# Patient Record
Sex: Female | Born: 1950
Health system: Southern US, Community
[De-identification: ages and names within clinical notes are randomized; demographics above are authoritative.]

## PROBLEM LIST (undated history)

## (undated) DIAGNOSIS — F419 Anxiety disorder, unspecified: Secondary | ICD-10-CM

## (undated) DIAGNOSIS — E119 Type 2 diabetes mellitus without complications: Secondary | ICD-10-CM

## (undated) DIAGNOSIS — E785 Hyperlipidemia, unspecified: Secondary | ICD-10-CM

## (undated) DIAGNOSIS — N951 Menopausal and female climacteric states: Secondary | ICD-10-CM

## (undated) DIAGNOSIS — F429 Obsessive-compulsive disorder, unspecified: Secondary | ICD-10-CM

## (undated) DIAGNOSIS — I509 Heart failure, unspecified: Secondary | ICD-10-CM

## (undated) DIAGNOSIS — H269 Unspecified cataract: Secondary | ICD-10-CM

## (undated) DIAGNOSIS — I10 Essential (primary) hypertension: Secondary | ICD-10-CM

## (undated) DIAGNOSIS — T7840XA Allergy, unspecified, initial encounter: Secondary | ICD-10-CM

## (undated) DIAGNOSIS — R002 Palpitations: Secondary | ICD-10-CM

## (undated) DIAGNOSIS — J45909 Unspecified asthma, uncomplicated: Secondary | ICD-10-CM

## (undated) DIAGNOSIS — F329 Major depressive disorder, single episode, unspecified: Secondary | ICD-10-CM

## (undated) DIAGNOSIS — F32A Depression, unspecified: Secondary | ICD-10-CM

## (undated) HISTORY — DX: Anxiety disorder, unspecified: F41.9

## (undated) HISTORY — DX: Hyperlipidemia, unspecified: E78.5

## (undated) HISTORY — DX: Obsessive-compulsive disorder, unspecified: F42.9

## (undated) HISTORY — DX: Menopausal and female climacteric states: N95.1

## (undated) HISTORY — DX: Unspecified asthma, uncomplicated: J45.909

## (undated) HISTORY — DX: Depression, unspecified: F32.A

## (undated) HISTORY — PX: OTHER SURGICAL HISTORY: SHX169

## (undated) HISTORY — DX: Allergy, unspecified, initial encounter: T78.40XA

## (undated) HISTORY — DX: Unspecified cataract: H26.9

## (undated) HISTORY — DX: Type 2 diabetes mellitus without complications: E11.9

## (undated) HISTORY — DX: Palpitations: R00.2

## (undated) HISTORY — PX: CHOLECYSTECTOMY: SHX55

## (undated) HISTORY — PX: ROOT CANAL: SHX2363

## (undated) HISTORY — DX: Essential (primary) hypertension: I10

## (undated) HISTORY — PX: EYE SURGERY: SHX253

## (undated) HISTORY — DX: Major depressive disorder, single episode, unspecified: F32.9

---

## 2005-08-20 LAB — HM MAMMOGRAPHY

## 2006-11-15 ENCOUNTER — Other Ambulatory Visit: Payer: Self-pay

## 2006-11-15 ENCOUNTER — Inpatient Hospital Stay: Payer: Self-pay | Admitting: Internal Medicine

## 2007-12-02 ENCOUNTER — Other Ambulatory Visit: Payer: Self-pay

## 2007-12-03 ENCOUNTER — Inpatient Hospital Stay: Payer: Self-pay | Admitting: Internal Medicine

## 2009-04-05 ENCOUNTER — Emergency Department: Payer: Self-pay | Admitting: Emergency Medicine

## 2014-04-01 ENCOUNTER — Emergency Department: Payer: Self-pay | Admitting: Emergency Medicine

## 2014-04-01 LAB — CBC WITH DIFFERENTIAL/PLATELET
BASOS ABS: 0.1 10*3/uL (ref 0.0–0.1)
Basophil %: 1.4 %
Eosinophil #: 0 10*3/uL (ref 0.0–0.7)
Eosinophil %: 0.4 %
HCT: 41.7 % (ref 35.0–47.0)
HGB: 13.1 g/dL (ref 12.0–16.0)
LYMPHS ABS: 1.9 10*3/uL (ref 1.0–3.6)
LYMPHS PCT: 45.6 %
MCH: 26.3 pg (ref 26.0–34.0)
MCHC: 31.3 g/dL — ABNORMAL LOW (ref 32.0–36.0)
MCV: 84 fL (ref 80–100)
Monocyte #: 0.6 x10 3/mm (ref 0.2–0.9)
Monocyte %: 14.1 %
NEUTROS ABS: 1.6 10*3/uL (ref 1.4–6.5)
Neutrophil %: 38.5 %
PLATELETS: 165 10*3/uL (ref 150–440)
RBC: 4.96 10*6/uL (ref 3.80–5.20)
RDW: 13.7 % (ref 11.5–14.5)
WBC: 4.2 10*3/uL (ref 3.6–11.0)

## 2014-04-01 LAB — URINALYSIS, COMPLETE
Bilirubin,UR: NEGATIVE
Blood: NEGATIVE
Glucose,UR: NEGATIVE mg/dL (ref 0–75)
Hyaline Cast: 74
Ketone: NEGATIVE
Leukocyte Esterase: NEGATIVE
NITRITE: NEGATIVE
PH: 6 (ref 4.5–8.0)
Protein: 25
Specific Gravity: 1.025 (ref 1.003–1.030)

## 2014-04-01 LAB — COMPREHENSIVE METABOLIC PANEL
ALBUMIN: 3.6 g/dL (ref 3.4–5.0)
ALT: 37 U/L (ref 12–78)
ANION GAP: 6 — AB (ref 7–16)
Alkaline Phosphatase: 92 U/L
BUN: 14 mg/dL (ref 7–18)
Bilirubin,Total: 0.4 mg/dL (ref 0.2–1.0)
Calcium, Total: 8.6 mg/dL (ref 8.5–10.1)
Chloride: 105 mmol/L (ref 98–107)
Co2: 27 mmol/L (ref 21–32)
Creatinine: 1.2 mg/dL (ref 0.60–1.30)
EGFR (African American): 56 — ABNORMAL LOW
EGFR (Non-African Amer.): 48 — ABNORMAL LOW
GLUCOSE: 91 mg/dL (ref 65–99)
OSMOLALITY: 276 (ref 275–301)
POTASSIUM: 3.9 mmol/L (ref 3.5–5.1)
SGOT(AST): 43 U/L — ABNORMAL HIGH (ref 15–37)
Sodium: 138 mmol/L (ref 136–145)
Total Protein: 7.9 g/dL (ref 6.4–8.2)

## 2015-06-25 ENCOUNTER — Other Ambulatory Visit: Payer: Self-pay

## 2015-06-25 NOTE — Telephone Encounter (Signed)
Has not been seen since October. Needs appointment then will get refill.

## 2015-06-26 NOTE — Telephone Encounter (Signed)
Called and scheduled patient an appointment in August.

## 2015-07-05 ENCOUNTER — Other Ambulatory Visit: Payer: Self-pay | Admitting: Family Medicine

## 2015-07-25 DIAGNOSIS — H9190 Unspecified hearing loss, unspecified ear: Secondary | ICD-10-CM | POA: Diagnosis not present

## 2015-07-25 DIAGNOSIS — H6121 Impacted cerumen, right ear: Secondary | ICD-10-CM | POA: Diagnosis not present

## 2015-08-08 DIAGNOSIS — R002 Palpitations: Secondary | ICD-10-CM | POA: Insufficient documentation

## 2015-08-08 DIAGNOSIS — F32A Depression, unspecified: Secondary | ICD-10-CM

## 2015-08-08 DIAGNOSIS — E785 Hyperlipidemia, unspecified: Secondary | ICD-10-CM | POA: Insufficient documentation

## 2015-08-08 DIAGNOSIS — F429 Obsessive-compulsive disorder, unspecified: Secondary | ICD-10-CM | POA: Insufficient documentation

## 2015-08-08 DIAGNOSIS — N951 Menopausal and female climacteric states: Secondary | ICD-10-CM | POA: Insufficient documentation

## 2015-08-08 DIAGNOSIS — J45909 Unspecified asthma, uncomplicated: Secondary | ICD-10-CM | POA: Insufficient documentation

## 2015-08-08 DIAGNOSIS — F329 Major depressive disorder, single episode, unspecified: Secondary | ICD-10-CM

## 2015-08-08 DIAGNOSIS — I10 Essential (primary) hypertension: Secondary | ICD-10-CM | POA: Insufficient documentation

## 2015-08-09 ENCOUNTER — Encounter: Payer: Self-pay | Admitting: Unknown Physician Specialty

## 2015-08-09 ENCOUNTER — Ambulatory Visit (INDEPENDENT_AMBULATORY_CARE_PROVIDER_SITE_OTHER): Payer: Medicare Other | Admitting: Unknown Physician Specialty

## 2015-08-09 VITALS — BP 141/79 | HR 76 | Temp 98.1°F | Ht 60.7 in | Wt 186.0 lb

## 2015-08-09 DIAGNOSIS — F322 Major depressive disorder, single episode, severe without psychotic features: Secondary | ICD-10-CM | POA: Diagnosis not present

## 2015-08-09 DIAGNOSIS — R221 Localized swelling, mass and lump, neck: Secondary | ICD-10-CM | POA: Diagnosis not present

## 2015-08-09 DIAGNOSIS — F329 Major depressive disorder, single episode, unspecified: Secondary | ICD-10-CM | POA: Insufficient documentation

## 2015-08-09 DIAGNOSIS — I10 Essential (primary) hypertension: Secondary | ICD-10-CM

## 2015-08-09 MED ORDER — HYDROCHLOROTHIAZIDE 25 MG PO TABS: 25.0000 mg | ORAL_TABLET | Freq: Every day | ORAL | Status: DC

## 2015-08-09 MED ORDER — PAROXETINE HCL 40 MG PO TABS: 40.0000 mg | ORAL_TABLET | ORAL | Status: DC

## 2015-08-09 NOTE — Assessment & Plan Note (Signed)
Borderline today.  Recheck at physical she will schedule.  Will do labs at physical

## 2015-08-09 NOTE — Assessment & Plan Note (Signed)
Stable on medications

## 2015-08-09 NOTE — Progress Notes (Signed)
BP 141/79 mmHg  Pulse 76  Temp(Src) 98.1 F (36.7 C)  Ht 5' 0.7" (1.542 m)  Wt 186 lb (84.369 kg)  BMI 35.48 kg/m2  SpO2 97%  LMP  (LMP Unknown)   Subjective:    Patient ID: Lisa Bass, female    DOB: 1951/07/04, 64 y.o.   MRN: [id number]  HPI: Lisa Bass is a 64 y.o. female  Chief Complaint  Patient presents with  . Medication Refill   Hypertension This is a chronic problem. The problem is controlled. Pertinent negatives include no anxiety, blurred vision, chest pain, headaches, malaise/fatigue, neck pain, orthopnea, palpitations, peripheral edema, PND, shortness of breath or sweats. Past treatments include diuretics. There are no compliance problems.    2.  DEPRESSION Patient is requesting refill(s). Mood status:  controlled  H6  Satisfied with current treatment?:  yes  H2 Symptom severity:  mild  H3  Medication compliance:  excellent compliance  P1 Psychotherapy/counseling:  no  P1  Previous psychiatric medications:  taking 1/2 Paroxetine  P1 .H: Depression Screen X  Anxious mood:  no  H2 Anhedonia:  yes   H3 Significant weight loss or gain:  no  H8 Insomnia:  no  H8 Fatigue:  no  H8 Feelings of worthlessness or guilt:  yes   H8 Impaired concentration/indecisiveness:  no  Suicidal ideations:  no  H8 Hopelessness:  no  H8 Crying spells:  no  H8  .V6: PHQ 9 score: 3  3.  Nodule Nodule on back of neck.  Has been there for a while about 2 weeks.  Not getting bigger   Relevant past medical, surgical, family and social history reviewed and updated as indicated. Interim medical history since our last visit reviewed. Allergies and medications reviewed and updated.  Review of Systems  Constitutional: Negative for malaise/fatigue.  Eyes: Negative for blurred vision.  Respiratory: Negative for shortness of breath.   Cardiovascular: Negative for chest pain, palpitations, orthopnea and PND.  Musculoskeletal: Negative for neck pain.  Neurological: Negative for  headaches.    Per HPI unless specifically indicated above     Objective:    BP 141/79 mmHg  Pulse 76  Temp(Src) 98.1 F (36.7 C)  Ht 5' 0.7" (1.542 m)  Wt 186 lb (84.369 kg)  BMI 35.48 kg/m2  SpO2 97%  LMP  (LMP Unknown)  Wt Readings from Last 3 Encounters:  08/09/15 186 lb (84.369 kg)  10/02/14 192 lb (87.091 kg)    Physical Exam  Constitutional: She is oriented to person, place, and time. She appears well-developed and well-nourished. No distress.  HENT:  Head: Normocephalic and atraumatic.  Eyes: Conjunctivae and lids are normal. Right eye exhibits no discharge. Left eye exhibits no discharge. No scleral icterus.  Neck:  Has a 4-5 cm nodule back of neck  Cardiovascular: Normal rate, regular rhythm and normal heart sounds.   Pulmonary/Chest: Effort normal and breath sounds normal. No respiratory distress.  Abdominal: Normal appearance. She exhibits no distension. There is no splenomegaly or hepatomegaly. There is no tenderness.  Musculoskeletal: Normal range of motion.  Neurological: She is alert and oriented to person, place, and time.  Skin: Skin is intact. No rash noted. No pallor.  Psychiatric: She has a normal mood and affect. Her behavior is normal. Judgment and thought content normal.  Nursing note and vitals reviewed.   Results for orders placed or performed in visit on 08/08/15  HM MAMMOGRAPHY  Result Value Ref Range   [facility] M[facility] from [facility]  Assessment & Plan:   Problem List Items Addressed This Visit      Unprioritized   Hypertension    Borderline today.  Recheck at physical she will schedule.  Will do labs at physical      Major depression, chronic - Primary    Stable on medications       Other Visit Diagnoses    Nodule of neck        Probably a lipoma.  Refer to ENT for further evaluation.           Follow up plan: Return for physical.

## 2015-08-28 DIAGNOSIS — D170 Benign lipomatous neoplasm of skin and subcutaneous tissue of head, face and neck: Secondary | ICD-10-CM | POA: Diagnosis not present

## 2015-08-28 DIAGNOSIS — H6120 Impacted cerumen, unspecified ear: Secondary | ICD-10-CM | POA: Diagnosis not present

## 2015-09-23 ENCOUNTER — Encounter: Payer: Self-pay | Admitting: Unknown Physician Specialty

## 2015-09-23 ENCOUNTER — Ambulatory Visit (INDEPENDENT_AMBULATORY_CARE_PROVIDER_SITE_OTHER): Payer: Medicare Other | Admitting: Unknown Physician Specialty

## 2015-09-23 VITALS — BP 127/78 | HR 78 | Temp 98.5°F | Ht 60.7 in | Wt 184.8 lb

## 2015-09-23 DIAGNOSIS — D151 Benign neoplasm of heart: Secondary | ICD-10-CM | POA: Diagnosis not present

## 2015-09-23 DIAGNOSIS — Z0000 Encounter for general adult medical examination without abnormal findings: Secondary | ICD-10-CM

## 2015-09-23 DIAGNOSIS — Z23 Encounter for immunization: Secondary | ICD-10-CM

## 2015-09-23 DIAGNOSIS — I10 Essential (primary) hypertension: Secondary | ICD-10-CM | POA: Diagnosis not present

## 2015-09-23 DIAGNOSIS — I2510 Atherosclerotic heart disease of native coronary artery without angina pectoris: Secondary | ICD-10-CM | POA: Insufficient documentation

## 2015-09-23 NOTE — Assessment & Plan Note (Addendum)
Refer to Dr. Clayborn Bigness due to low voltage on EKG

## 2015-09-23 NOTE — Progress Notes (Signed)
BP 127/78 mmHg  Pulse 78  Temp(Src) 98.5 F (36.9 C)  Ht 5' 0.7" (1.542 m)  Wt 184 lb 12.8 oz (83.825 kg)  BMI 35.25 kg/m2  SpO2 96%  LMP  (LMP Unknown)   Subjective:    Patient ID: Lisa Bass, female    DOB: [birth year], 64 y.o.   MRN: [id number]  HPI: Lisa Bass is a 64 y.o. female  Chief Complaint  Patient presents with  . Medicare Wellness   Depression screen [facility] 2/9 09/23/2015 08/09/2015  Decreased Interest 1 0  Down, Depressed, Hopeless 0 1  PHQ - 2 Score 1 1   Functional Status Survey: Is the patient deaf or have difficulty hearing?: No Does the patient have difficulty seeing, even when wearing glasses/contacts?: No Does the patient have difficulty concentrating, remembering, or making decisions?: No Does the patient have difficulty walking or climbing stairs?: No Does the patient have difficulty dressing or bathing?: No Does the patient have difficulty doing errands alone such as visiting a doctor's office or shopping?: No  Relevant past medical, surgical, family and social history reviewed and updated as indicated. Interim medical history since our last visit reviewed. Allergies and medications reviewed and updated.  See functional status, depression screen, and fall's risk assessment  under the appropriate section.    Pt is able to perform complex mental tasks, recognize clock face, recognize time and do a 3 item recall.    Hypertension This is a new problem. The problem is unchanged. The problem is controlled. Pertinent negatives include no anxiety, blurred vision, chest pain, headaches, malaise/fatigue, neck pain, orthopnea, palpitations, peripheral edema, PND, shortness of breath or sweats. There are no associated agents to hypertension. There are no known risk factors for coronary artery disease. Past treatments include nothing. The current treatment provides no improvement. There are no compliance problems.  There is no history of chronic renal disease.       Relevant past medical, surgical, family and social history reviewed and updated as indicated. Interim medical history since our last visit reviewed. Allergies and medications reviewed and updated.  Review of Systems  Constitutional: Negative for malaise/fatigue.  Eyes: Negative for blurred vision.  Respiratory: Negative for shortness of breath.   Cardiovascular: Negative for chest pain, palpitations, orthopnea and PND.  Musculoskeletal: Negative for neck pain.  Neurological: Negative for headaches.    Per HPI unless specifically indicated above     Objective:    BP 127/78 mmHg  Pulse 78  Temp(Src) 98.5 F (36.9 C)  Ht 5' 0.7" (1.542 m)  Wt 184 lb 12.8 oz (83.825 kg)  BMI 35.25 kg/m2  SpO2 96%  LMP  (LMP Unknown)  Wt Readings from Last 3 Encounters:  09/23/15 184 lb 12.8 oz (83.825 kg)  08/09/15 186 lb (84.369 kg)  10/02/14 192 lb (87.091 kg)    Physical Exam  Constitutional: She is oriented to person, place, and time. She appears well-developed and well-nourished.  HENT:  Head: Normocephalic and atraumatic.  Eyes: Pupils are equal, round, and reactive to light. Right eye exhibits no discharge. Left eye exhibits no discharge. No scleral icterus.  Neck: Normal range of motion. Neck supple. Carotid bruit is not present. No thyromegaly present.  Cardiovascular: Normal rate, regular rhythm and normal heart sounds.  Exam reveals no gallop and no friction rub.   No murmur heard. Pulmonary/Chest: Effort normal and breath sounds normal. No respiratory distress. She has no wheezes. She has no rales.  Abdominal: Soft. Bowel sounds are  normal. There is no tenderness. There is no rebound.  Genitourinary: Uterus normal. No breast swelling, tenderness or discharge. Cervix exhibits no motion tenderness, no discharge and no friability. Right adnexum displays no mass, no tenderness and no fullness. Left adnexum displays no mass, no tenderness and no fullness.  Musculoskeletal:  Normal range of motion.  Lymphadenopathy:    She has no cervical adenopathy.  Neurological: She is alert and oriented to person, place, and time.  Skin: Skin is warm, dry and intact. No rash noted.  Psychiatric: She has a normal mood and affect. Her speech is normal and behavior is normal. Judgment and thought content normal. Cognition and memory are normal.   EKG shows low voltage.  She has not seen Dr. Clayborn Bigness for "a while"  Results for orders placed or performed in visit on 08/08/15  HM MAMMOGRAPHY  Result Value Ref Range   HM Mammogram from [facility]       Assessment & Plan:   Problem List Items Addressed This Visit      Unprioritized   Hypertension    Stable, continue present medications.       Relevant Orders   Comprehensive metabolic panel   Myxoma of heart    Refer to Dr. Clayborn Bigness due to low voltage on EKG      Relevant Orders   Ambulatory referral to Cardiology    Other Visit Diagnoses    Routine general medical examination at a health care facility    -  Primary    Relevant Orders    Pneumococcal conjugate vaccine 13-valent IM (Completed)    MM DIGITAL SCREENING BILATERAL    DG Bone Density    Pap Lb, rfx HPV ASCU    CBC with Differential/Platelet    Comprehensive metabolic panel    TSH    HIV antibody    Hepatitis C antibody    Lipid Panel w/o Chol/HDL Ratio    EKG 12-Lead (Completed)        Follow up plan: Return in about 6 months (around 03/23/2016).

## 2015-09-23 NOTE — Assessment & Plan Note (Signed)
Stable, continue present medications.   

## 2015-09-24 ENCOUNTER — Encounter: Payer: Self-pay | Admitting: Unknown Physician Specialty

## 2015-09-24 LAB — HIV ANTIBODY (ROUTINE TESTING W REFLEX): HIV Screen 4th Generation wRfx: NONREACTIVE

## 2015-09-24 LAB — COMPREHENSIVE METABOLIC PANEL
A/G RATIO: 1.2 (ref 1.1–2.5)
ALBUMIN: 3.8 g/dL (ref 3.6–4.8)
ALK PHOS: 86 IU/L (ref 39–117)
ALT: 20 IU/L (ref 0–32)
AST: 23 IU/L (ref 0–40)
BILIRUBIN TOTAL: 0.4 mg/dL (ref 0.0–1.2)
BUN / CREAT RATIO: 10 — AB (ref 11–26)
BUN: 11 mg/dL (ref 8–27)
CHLORIDE: 100 mmol/L (ref 97–108)
CO2: 25 mmol/L (ref 18–29)
Calcium: 9.4 mg/dL (ref 8.7–10.3)
Creatinine, Ser: 1.06 mg/dL — ABNORMAL HIGH (ref 0.57–1.00)
GFR calc non Af Amer: 55 mL/min/{1.73_m2} — ABNORMAL LOW (ref >59)
GFR, EST AFRICAN AMERICAN: 64 mL/min/{1.73_m2} (ref >59)
GLOBULIN, TOTAL: 3.2 g/dL (ref 1.5–4.5)
GLUCOSE: 105 mg/dL — AB (ref 65–99)
POTASSIUM: 3.6 mmol/L (ref 3.5–5.2)
SODIUM: 140 mmol/L (ref 134–144)
TOTAL PROTEIN: 7 g/dL (ref 6.0–8.5)

## 2015-09-24 LAB — HEPATITIS C ANTIBODY

## 2015-09-24 LAB — CBC WITH DIFFERENTIAL/PLATELET
BASOS ABS: 0.1 10*3/uL (ref 0.0–0.2)
BASOS: 1 %
EOS (ABSOLUTE): 0.4 10*3/uL (ref 0.0–0.4)
Eos: 4 %
HEMOGLOBIN: 11.9 g/dL (ref 11.1–15.9)
Hematocrit: 36 % (ref 34.0–46.6)
IMMATURE GRANS (ABS): 0.1 10*3/uL (ref 0.0–0.1)
Immature Granulocytes: 1 %
LYMPHS ABS: 2.5 10*3/uL (ref 0.7–3.1)
Lymphs: 29 %
MCH: 28.2 pg (ref 26.6–33.0)
MCHC: 33.1 g/dL (ref 31.5–35.7)
MCV: 85 fL (ref 79–97)
MONOCYTES: 6 %
Monocytes Absolute: 0.5 10*3/uL (ref 0.1–0.9)
NEUTROS ABS: 5.1 10*3/uL (ref 1.4–7.0)
Neutrophils: 59 %
Platelets: 235 10*3/uL (ref 150–379)
RBC: 4.22 x10E6/uL (ref 3.77–5.28)
RDW: 14.1 % (ref 12.3–15.4)
WBC: 8.6 10*3/uL (ref 3.4–10.8)

## 2015-09-24 LAB — LIPID PANEL W/O CHOL/HDL RATIO
Cholesterol, Total: 203 mg/dL — ABNORMAL HIGH (ref 100–199)
HDL: 44 mg/dL (ref >39)
LDL Calculated: 123 mg/dL — ABNORMAL HIGH (ref 0–99)
Triglycerides: 182 mg/dL — ABNORMAL HIGH (ref 0–149)
VLDL CHOLESTEROL CAL: 36 mg/dL (ref 5–40)

## 2015-09-24 LAB — TSH: TSH: 3.93 u[IU]/mL (ref 0.450–4.500)

## 2015-09-26 LAB — PAP LB, RFX HPV ASCU: PAP SMEAR COMMENT: 0

## 2015-09-30 DIAGNOSIS — E669 Obesity, unspecified: Secondary | ICD-10-CM | POA: Diagnosis not present

## 2015-09-30 DIAGNOSIS — R9431 Abnormal electrocardiogram [ECG] [EKG]: Secondary | ICD-10-CM | POA: Diagnosis not present

## 2015-09-30 DIAGNOSIS — Z86018 Personal history of other benign neoplasm: Secondary | ICD-10-CM | POA: Diagnosis not present

## 2015-09-30 DIAGNOSIS — I10 Essential (primary) hypertension: Secondary | ICD-10-CM | POA: Diagnosis not present

## 2015-12-07 DIAGNOSIS — R070 Pain in throat: Secondary | ICD-10-CM | POA: Diagnosis not present

## 2016-01-02 DIAGNOSIS — R9431 Abnormal electrocardiogram [ECG] [EKG]: Secondary | ICD-10-CM | POA: Diagnosis not present

## 2016-01-16 DIAGNOSIS — Z86018 Personal history of other benign neoplasm: Secondary | ICD-10-CM | POA: Diagnosis not present

## 2016-01-16 DIAGNOSIS — I10 Essential (primary) hypertension: Secondary | ICD-10-CM | POA: Diagnosis not present

## 2016-01-16 DIAGNOSIS — R9431 Abnormal electrocardiogram [ECG] [EKG]: Secondary | ICD-10-CM | POA: Diagnosis not present

## 2016-03-23 ENCOUNTER — Ambulatory Visit (INDEPENDENT_AMBULATORY_CARE_PROVIDER_SITE_OTHER): Payer: Medicare Other | Admitting: Unknown Physician Specialty

## 2016-03-23 ENCOUNTER — Encounter: Payer: Self-pay | Admitting: Unknown Physician Specialty

## 2016-03-23 VITALS — BP 135/75 | HR 80 | Temp 98.0°F | Ht 60.7 in | Wt 183.0 lb

## 2016-03-23 DIAGNOSIS — I10 Essential (primary) hypertension: Secondary | ICD-10-CM | POA: Diagnosis not present

## 2016-03-23 DIAGNOSIS — F329 Major depressive disorder, single episode, unspecified: Secondary | ICD-10-CM

## 2016-03-23 MED ORDER — PAROXETINE HCL 40 MG PO TABS: 40.0000 mg | ORAL_TABLET | ORAL | Status: DC

## 2016-03-23 MED ORDER — HYDROCHLOROTHIAZIDE 25 MG PO TABS: 25.0000 mg | ORAL_TABLET | Freq: Every day | ORAL | Status: DC

## 2016-03-23 NOTE — Progress Notes (Signed)
BP 135/75 mmHg  Pulse 80  Temp(Src) 98 F (36.7 C)  Ht 5' 0.7" (1.542 m)  Wt 183 lb (83.008 kg)  BMI 34.91 kg/m2  SpO2 97%  LMP  (LMP Unknown)   Subjective:    Patient ID: Lisa Bass, female    DOB: 02/27/1951, 65 y.o.   MRN: [id number]  HPI: Lisa Bass is a 65 y.o. female  Chief Complaint  Patient presents with  . Depression  . Hypertension   Hypertension Using medications without difficulty Average home BPs not checking much  No problems or lightheadedness No chest pain with exertion or shortness of breath No Edema  Depression screen [facility] 2/9 03/23/2016 09/23/2015 08/09/2015  Decreased Interest 0 1 0  Down, Depressed, Hopeless 0 0 1  PHQ - 2 Score 0 1 1  Altered sleeping 1 - -  Tired, decreased energy 1 - -  Change in appetite 0 - -  Feeling bad or failure about yourself  0 - -  Trouble concentrating 0 - -  Moving slowly or fidgety/restless 0 - -  Suicidal thoughts 0 - -  PHQ-9 Score 2 - -      Relevant past medical, surgical, family and social history reviewed and updated as indicated. Interim medical history since our last visit reviewed. Allergies and medications reviewed and updated.  Review of Systems  Per HPI unless specifically indicated above     Objective:    BP 135/75 mmHg  Pulse 80  Temp(Src) 98 F (36.7 C)  Ht 5' 0.7" (1.542 m)  Wt 183 lb (83.008 kg)  BMI 34.91 kg/m2  SpO2 97%  LMP  (LMP Unknown)  Wt Readings from Last 3 Encounters:  03/23/16 183 lb (83.008 kg)  09/23/15 184 lb 12.8 oz (83.825 kg)  08/09/15 186 lb (84.369 kg)    Physical Exam  Constitutional: She is oriented to person, place, and time. She appears well-developed and well-nourished. No distress.  HENT:  Head: Normocephalic and atraumatic.  Eyes: Conjunctivae and lids are normal. Right eye exhibits no discharge. Left eye exhibits no discharge. No scleral icterus.  Neck: Normal range of motion. Neck supple. No JVD present. Carotid bruit is not present.   Cardiovascular: Normal rate, regular rhythm and normal heart sounds.   Pulmonary/Chest: Effort normal and breath sounds normal.  Abdominal: Normal appearance. There is no splenomegaly or hepatomegaly.  Musculoskeletal: Normal range of motion.  Neurological: She is alert and oriented to person, place, and time.  Skin: Skin is warm, dry and intact. No rash noted. No pallor.  Psychiatric: She has a normal mood and affect. Her behavior is normal. Judgment and thought content normal.    Results for orders placed or performed in visit on 09/23/15  CBC with Differential/Platelet  Result Value Ref Range   WBC 8.6 3.4 - 10.8 x10E3/uL   RBC 4.22 3.77 - 5.28 x10E6/uL   Hemoglobin 11.9 11.1 - 15.9 g/dL   Hematocrit 36.0 34.0 - 46.6 %   MCV 85 79 - 97 fL   MCH 28.2 26.6 - 33.0 pg   MCHC 33.1 31.5 - 35.7 g/dL   RDW 14.1 12.3 - 15.4 %   Platelets 235 150 - 379 x10E3/uL   Neutrophils 59 %   Lymphs 29 %   Monocytes 6 %   Eos 4 %   Basos 1 %   Neutrophils Absolute 5.1 1.4 - 7.0 x10E3/uL   Lymphocytes Absolute 2.5 0.7 - 3.1 x10E3/uL   Monocytes Absolute 0.5 0.1 -  0.9 x10E3/uL   EOS (ABSOLUTE) 0.4 0.0 - 0.4 x10E3/uL   Basophils Absolute 0.1 0.0 - 0.2 x10E3/uL   Immature Granulocytes 1 %   Immature Grans (Abs) 0.1 0.0 - 0.1 x10E3/uL  Comprehensive metabolic panel  Result Value Ref Range   Glucose 105 (H) 65 - 99 mg/dL   BUN 11 8 - 27 mg/dL   Creatinine, Ser 1.06 (H) 0.57 - 1.00 mg/dL   GFR calc non Af Amer 55 (L) >59 mL/min/1.73   GFR calc Af Amer 64 >59 mL/min/1.73   BUN/Creatinine Ratio 10 (L) 11 - 26   Sodium 140 134 - 144 mmol/L   Potassium 3.6 3.5 - 5.2 mmol/L   Chloride 100 97 - 108 mmol/L   CO2 25 18 - 29 mmol/L   Calcium 9.4 8.7 - 10.3 mg/dL   Total Protein 7.0 6.0 - 8.5 g/dL   Albumin 3.8 3.6 - 4.8 g/dL   Globulin, Total 3.2 1.5 - 4.5 g/dL   Albumin/Globulin Ratio 1.2 1.1 - 2.5   Bilirubin Total 0.4 0.0 - 1.2 mg/dL   Alkaline Phosphatase 86 39 - 117 IU/L   AST 23 0 - 40 IU/L    ALT 20 0 - 32 IU/L  TSH  Result Value Ref Range   TSH 3.930 0.450 - 4.500 uIU/mL  HIV antibody  Result Value Ref Range   HIV Screen 4th Generation wRfx Non Reactive Non Reactive  Hepatitis C antibody  Result Value Ref Range   Hep C Virus Ab <0.1 0.0 - 0.9 s/co ratio  Lipid Panel w/o Chol/HDL Ratio  Result Value Ref Range   Cholesterol, Total 203 (H) 100 - 199 mg/dL   Triglycerides 182 (H) 0 - 149 mg/dL   HDL 44 >39 mg/dL   VLDL Cholesterol Cal 36 5 - 40 mg/dL   LDL Calculated 123 (H) 0 - 99 mg/dL  Pap Lb, rfx HPV ASCU  Result Value Ref Range   DIAGNOSIS: Comment    Specimen adequacy: Comment    CLINICIAN PROVIDED ICD10: Comment    Performed by: Comment    PAP SMEAR COMMENT .    Note: Comment    PAP REFLEX: Comment       Assessment & Plan:   Problem List Items Addressed This Visit      Unprioritized   Hypertension   Relevant Medications   aspirin 81 MG tablet   Major depression, chronic (HCC) - Primary      Stable, continue present medications.    Follow up plan: No Follow-up on file.

## 2016-03-24 LAB — COMPREHENSIVE METABOLIC PANEL
ALBUMIN: 4 g/dL (ref 3.6–4.8)
ALT: 20 IU/L (ref 0–32)
AST: 20 IU/L (ref 0–40)
Albumin/Globulin Ratio: 1.3 (ref 1.2–2.2)
Alkaline Phosphatase: 90 IU/L (ref 39–117)
BILIRUBIN TOTAL: 0.3 mg/dL (ref 0.0–1.2)
BUN / CREAT RATIO: 10 — AB (ref 12–28)
BUN: 10 mg/dL (ref 8–27)
CALCIUM: 9.5 mg/dL (ref 8.7–10.3)
CO2: 26 mmol/L (ref 18–29)
CREATININE: 1.03 mg/dL — AB (ref 0.57–1.00)
Chloride: 100 mmol/L (ref 96–106)
GFR calc non Af Amer: 57 mL/min/{1.73_m2} — ABNORMAL LOW (ref >59)
GFR, EST AFRICAN AMERICAN: 66 mL/min/{1.73_m2} (ref >59)
GLUCOSE: 136 mg/dL — AB (ref 65–99)
Globulin, Total: 3.1 g/dL (ref 1.5–4.5)
Potassium: 3.7 mmol/L (ref 3.5–5.2)
Sodium: 141 mmol/L (ref 134–144)
TOTAL PROTEIN: 7.1 g/dL (ref 6.0–8.5)

## 2016-04-01 ENCOUNTER — Telehealth: Payer: Self-pay | Admitting: Unknown Physician Specialty

## 2016-04-01 NOTE — Telephone Encounter (Signed)
Pt would like a call back about lab results ..  °

## 2016-04-01 NOTE — Telephone Encounter (Signed)
Routing to provider  

## 2016-04-06 ENCOUNTER — Encounter: Payer: Self-pay | Admitting: Unknown Physician Specialty

## 2016-04-07 NOTE — Telephone Encounter (Signed)
Discussed labs with patient

## 2016-07-29 ENCOUNTER — Encounter: Payer: Self-pay | Admitting: *Deleted

## 2016-07-29 ENCOUNTER — Emergency Department
Admission: EM | Admit: 2016-07-29 | Discharge: 2016-07-30 | Disposition: A | Discharge status: Home / Self Care | Payer: Medicare Other | Attending: Emergency Medicine | Admitting: Emergency Medicine

## 2016-07-29 DIAGNOSIS — K1120 Sialoadenitis, unspecified: Secondary | ICD-10-CM | POA: Diagnosis not present

## 2016-07-29 DIAGNOSIS — Z79899 Other long term (current) drug therapy: Secondary | ICD-10-CM | POA: Diagnosis not present

## 2016-07-29 DIAGNOSIS — Z7982 Long term (current) use of aspirin: Secondary | ICD-10-CM | POA: Insufficient documentation

## 2016-07-29 DIAGNOSIS — H5712 Ocular pain, left eye: Secondary | ICD-10-CM | POA: Diagnosis not present

## 2016-07-29 DIAGNOSIS — I2510 Atherosclerotic heart disease of native coronary artery without angina pectoris: Secondary | ICD-10-CM | POA: Insufficient documentation

## 2016-07-29 DIAGNOSIS — I110 Hypertensive heart disease with heart failure: Secondary | ICD-10-CM | POA: Insufficient documentation

## 2016-07-29 DIAGNOSIS — J45909 Unspecified asthma, uncomplicated: Secondary | ICD-10-CM | POA: Diagnosis not present

## 2016-07-29 DIAGNOSIS — R220 Localized swelling, mass and lump, head: Secondary | ICD-10-CM | POA: Diagnosis not present

## 2016-07-29 DIAGNOSIS — I509 Heart failure, unspecified: Secondary | ICD-10-CM | POA: Insufficient documentation

## 2016-07-29 DIAGNOSIS — H9202 Otalgia, left ear: Secondary | ICD-10-CM | POA: Diagnosis present

## 2016-07-29 HISTORY — DX: Heart failure, unspecified: I50.9

## 2016-07-29 NOTE — ED Triage Notes (Signed)
Pt has swelling to left side of face.  Sx for 1 day.  Pt has left earache.  Pt resp distress.  No diff swallowing.  states it hurts to chew.  Pt reports drainage from left ear.  Pt alert.   Speech clear.

## 2016-07-30 ENCOUNTER — Emergency Department: Payer: Medicare Other

## 2016-07-30 DIAGNOSIS — H5712 Ocular pain, left eye: Secondary | ICD-10-CM | POA: Diagnosis not present

## 2016-07-30 DIAGNOSIS — R220 Localized swelling, mass and lump, head: Secondary | ICD-10-CM | POA: Diagnosis not present

## 2016-07-30 LAB — BASIC METABOLIC PANEL
ANION GAP: 9 (ref 5–15)
BUN: 13 mg/dL (ref 6–20)
CALCIUM: 9.7 mg/dL (ref 8.9–10.3)
CO2: 29 mmol/L (ref 22–32)
Chloride: 101 mmol/L (ref 101–111)
Creatinine, Ser: 1.06 mg/dL — ABNORMAL HIGH (ref 0.44–1.00)
GFR calc Af Amer: >60 mL/min (ref >60)
GFR, EST NON AFRICAN AMERICAN: 54 mL/min — AB (ref >60)
GLUCOSE: 129 mg/dL — AB (ref 65–99)
Potassium: 3.2 mmol/L — ABNORMAL LOW (ref 3.5–5.1)
SODIUM: 139 mmol/L (ref 135–145)

## 2016-07-30 LAB — CBC WITH DIFFERENTIAL/PLATELET
BASOS PCT: 1 %
Basophils Absolute: 0.1 10*3/uL (ref 0–0.1)
Eosinophils Absolute: 0.2 10*3/uL (ref 0–0.7)
Eosinophils Relative: 1 %
HEMATOCRIT: 38.6 % (ref 35.0–47.0)
HEMOGLOBIN: 13.4 g/dL (ref 12.0–16.0)
LYMPHS ABS: 1.9 10*3/uL (ref 1.0–3.6)
Lymphocytes Relative: 14 %
MCH: 28.8 pg (ref 26.0–34.0)
MCHC: 34.7 g/dL (ref 32.0–36.0)
MCV: 83.2 fL (ref 80.0–100.0)
MONOS PCT: 7 %
Monocytes Absolute: 1 10*3/uL — ABNORMAL HIGH (ref 0.2–0.9)
NEUTROS ABS: 10.5 10*3/uL — AB (ref 1.4–6.5)
NEUTROS PCT: 77 %
Platelets: 255 10*3/uL (ref 150–440)
RBC: 4.64 MIL/uL (ref 3.80–5.20)
RDW: 13.7 % (ref 11.5–14.5)
WBC: 13.7 10*3/uL — ABNORMAL HIGH (ref 3.6–11.0)

## 2016-07-30 MED ORDER — CEPHALEXIN 500 MG PO CAPS: 500.0000 mg | ORAL_CAPSULE | Freq: Three times a day (TID) | ORAL | 0 refills | Status: DC

## 2016-07-30 MED ORDER — IOPAMIDOL (ISOVUE-300) INJECTION 61%
75.0000 mL | Freq: Once | INTRAVENOUS | Status: AC | PRN
  Administered 2016-07-30: 75 mL via INTRAVENOUS

## 2016-07-30 MED ORDER — CEPHALEXIN 500 MG PO CAPS
500.0000 mg | ORAL_CAPSULE | Freq: Once | ORAL | Status: AC
  Administered 2016-07-30: 500 mg via ORAL
  Filled 2016-07-30: qty 1

## 2016-07-30 MED ORDER — CLINDAMYCIN PHOSPHATE 600 MG/50ML IV SOLN
600.0000 mg | Freq: Once | INTRAVENOUS | Status: AC
  Administered 2016-07-30: 600 mg via INTRAVENOUS
  Filled 2016-07-30: qty 50

## 2016-07-30 MED ORDER — CLINDAMYCIN HCL 300 MG PO CAPS: 300.0000 mg | ORAL_CAPSULE | Freq: Three times a day (TID) | ORAL | 0 refills | Status: DC

## 2016-07-30 NOTE — Discharge Instructions (Signed)
1. Take antibiotics as prescribed: Clindamycin 300 mg 3 times daily 10 days Keflex 500 mg 3 times daily 10 days 2. Return to the ER for worsening symptoms, fever, persistent vomiting or other concerns.

## 2016-07-30 NOTE — ED Provider Notes (Signed)
[facility] Emergency Department Provider Note   ____________________________________________   First MD Initiated Contact with Patient 07/30/16 0012     (approximate)  I have reviewed the triage vital signs and the nursing notes.   HISTORY  Chief Complaint Otalgia and Facial Swelling    HPI Lisa Bass is a 65 y.o. female who presents to the ED from home with a chief complaint of left ear and face swelling. Onset of symptoms for one day associated with left earache with some drainage, increased redness and swelling. Patient had left over clindamycin at home from prior dental work and has taken 2 doses prior to arrival. Denies associated fever, chills, headache, neck pain, dental pain, chest pain, shortness of breath, abdominal pain, nausea, vomiting, diarrhea. Denies recent barotrauma or swimming. Denies recent travel or trauma. Nothing makes her symptoms better or worse.   Past Medical History:  Diagnosis Date  . Asthma   . CHF (congestive heart failure) ([place])   . Depression   . Hyperlipidemia   . Hypertension   . OCD (obsessive compulsive disorder)   . Palpitations   . Perimenopause     Patient Active Problem List   Diagnosis Date Noted  . CAD (coronary artery disease) 09/23/2015  . Myxoma of heart 09/23/2015  . Major depression, chronic ([place]) 08/09/2015  . Perimenopause 08/08/2015  . Asthma 08/08/2015  . Palpitations 08/08/2015  . OCD (obsessive compulsive disorder) 08/08/2015  . Hypertension 08/08/2015  . Hyperlipidemia 08/08/2015    Past Surgical History:  Procedure Laterality Date  . CHOLECYSTECTOMY    . open heart surgery    . ROOT CANAL     x2    Prior to Admission medications   Medication Sig Start Date End Date Taking? Authorizing Provider  aspirin 81 MG tablet Take 81 mg by mouth daily.   Yes Historical Provider, MD  hydrochlorothiazide (HYDRODIURIL) 25 MG tablet Take 1 tablet (25 mg total) by mouth daily. 03/23/16  Yes  [name], NP  PARoxetine (PAXIL) 40 MG tablet Take 1 tablet (40 mg total) by mouth every morning. 03/23/16  Yes [name], NP  cephALEXin (KEFLEX) 500 MG capsule Take 1 capsule (500 mg total) by mouth 3 (three) times daily. 07/30/16   Paulette Blanch, MD  clindamycin (CLEOCIN) 300 MG capsule Take 1 capsule (300 mg total) by mouth 3 (three) times daily. 07/30/16   Paulette Blanch, MD    Allergies Penicillins; Biaxin [clarithromycin]; and Lisinopril  Family History  Problem Relation Age of Onset  . Alcohol abuse Mother   . Hypertension Mother   . Dementia Father   . Hypertension Father   . Diabetes Father   . Diabetes Sister   . GI problems Daughter   . Heart disease Maternal Grandfather     Social History Social History  Substance Use Topics  . Smoking status: Never Smoker  . Smokeless tobacco: Never Used  . Alcohol use No    Review of Systems  Constitutional: No fever/chills. Eyes: No visual changes. ENT: Positive for left ear pain and facial swelling. No sore throat. Cardiovascular: Denies chest pain. Respiratory: Denies shortness of breath. Gastrointestinal: No abdominal pain.  No nausea, no vomiting.  No diarrhea.  No constipation. Genitourinary: Negative for dysuria. Musculoskeletal: Negative for back pain. Skin: Negative for rash. Neurological: Negative for headaches, focal weakness or numbness.  10-point ROS otherwise negative.  ____________________________________________   PHYSICAL EXAM:  VITAL SIGNS: ED Triage Vitals  Enc Vitals Group  BP 07/29/16 2328 (!) 141/84     Pulse Rate 07/29/16 2328 90     Resp 07/29/16 2328 18     Temp 07/29/16 2328 98.5 F (36.9 C)     Temp Source 07/29/16 2328 Oral     SpO2 07/29/16 2328 95 %     Weight 07/29/16 2328 192 lb (87.1 kg)     Height 07/29/16 2328 5' (1.524 m)     Head Circumference --      Peak Flow --      Pain Score 07/29/16 2339 3     Pain Loc --      Pain Edu? --      Excl. in [place]? --      Constitutional: Alert and oriented. Well appearing and in mild acute distress. Eyes: Conjunctivae are normal. PERRL. EOMI. Head: Atraumatic. Ears: Right ear within normal limits. External left ear with excoriation, weeping and crusting to the outer ear near the external auditory meatus. There is surrounding warmth, erythema and swelling to the left cheek. Mastoid process is nontender. Tympanic membrane within normal limits. Nose: No congestion/rhinnorhea. Mouth/Throat: Mucous membranes are moist.  Oropharynx non-erythematous.  No trismus. Neck: No stridor.   Cardiovascular: Normal rate, regular rhythm. Grossly normal heart sounds.  Good peripheral circulation. Respiratory: Normal respiratory effort.  No retractions. Lungs CTAB. Gastrointestinal: Soft and nontender. No distention. No abdominal bruits. No CVA tenderness. Musculoskeletal: No lower extremity tenderness nor edema.  No joint effusions. Neurologic:  Normal speech and language. No gross focal neurologic deficits are appreciated. No gait instability. Skin:  Skin is warm, dry and intact. No rash noted. Psychiatric: Mood and affect are normal. Speech and behavior are normal.  ____________________________________________   LABS (all labs ordered are listed, but only abnormal results are displayed)  Labs Reviewed  CBC WITH DIFFERENTIAL/PLATELET - Abnormal; Notable for the following:       Result Value   WBC 13.7 (*)    Neutro Abs 10.5 (*)    Monocytes Absolute 1.0 (*)    All other components within normal limits  BASIC METABOLIC PANEL - Abnormal; Notable for the following:    Potassium 3.2 (*)    Glucose, Bld 129 (*)    Creatinine, Ser 1.06 (*)    GFR calc non Af Amer 54 (*)    All other components within normal limits   ____________________________________________  EKG  None ____________________________________________  RADIOLOGY  CT temporal bone interpreted per Dr. Jeannine Boga: 1. Findings suggestive of acute  left parotitis, partially visualized on this exam. Soft tissue swelling with inflammatory stranding extends superiorly and posteriorly from the left parotid space to involve the pinna and auricle of the left ear, with mild soft tissue stranding and swelling extending into the lateral left EAC and left postauricular soft tissues. No abscess or drainable fluid collection identified on this exam. 2. Otherwise normal temporal bone CT. No findings to suggest acute otomastoiditis. ____________________________________________   PROCEDURES  Procedure(s) performed: None  Procedures  Critical Care performed: No  ____________________________________________   INITIAL IMPRESSION / ASSESSMENT AND PLAN / ED COURSE  Pertinent labs & imaging results that were available during my care of the patient were reviewed by me and considered in my medical decision making (see chart for details).  65 year old female who presents with otalgia, left external ear/facial cellulitis which is being partially treated with clindamycin at home. Patient is not a diabetic; however, will obtain CT temporal bone to evaluate for mastoiditis. Will obtain screening lab work and administer  IV clindamycin.  Clinical Course  Comment By Time  Updated patient and spouse of CT imaging results. Will add Keflex to Clindamycin. Noted patient has a penicillin allergy (swelling). She states confidently that she has taken Keflex previously without adverse reaction. Advise close follow-up with her PCP this week. Strict return precautions given. Both verbalize understanding and agree with plan of care. Paulette Blanch, MD 08/10 0310     ____________________________________________   FINAL CLINICAL IMPRESSION(S) / ED DIAGNOSES  Final diagnoses:  Parotitis      NEW MEDICATIONS STARTED DURING THIS VISIT:  Discharge Medication List as of 07/30/2016  3:11 AM    START taking these medications   Details  cephALEXin (KEFLEX) 500 MG  capsule Take 1 capsule (500 mg total) by mouth 3 (three) times daily., Starting Thu 07/30/2016, Print    clindamycin (CLEOCIN) 300 MG capsule Take 1 capsule (300 mg total) by mouth 3 (three) times daily., Starting Thu 07/30/2016, Print         Note:  This document was prepared using Dragon voice recognition software and may include unintentional dictation errors.    Paulette Blanch, MD 07/30/16 [phone]

## 2016-09-23 ENCOUNTER — Encounter: Payer: Medicare Other | Admitting: Unknown Physician Specialty

## 2016-11-02 ENCOUNTER — Encounter: Payer: Self-pay | Admitting: Unknown Physician Specialty

## 2016-12-18 ENCOUNTER — Encounter: Payer: Medicare Other | Admitting: Unknown Physician Specialty

## 2016-12-22 ENCOUNTER — Ambulatory Visit (INDEPENDENT_AMBULATORY_CARE_PROVIDER_SITE_OTHER): Payer: Medicare Other | Admitting: Unknown Physician Specialty

## 2016-12-22 ENCOUNTER — Encounter: Payer: Self-pay | Admitting: Unknown Physician Specialty

## 2016-12-22 VITALS — BP 126/76 | HR 80 | Temp 98.0°F | Ht 61.2 in | Wt 185.8 lb

## 2016-12-22 DIAGNOSIS — E2839 Other primary ovarian failure: Secondary | ICD-10-CM

## 2016-12-22 DIAGNOSIS — I10 Essential (primary) hypertension: Secondary | ICD-10-CM | POA: Diagnosis not present

## 2016-12-22 DIAGNOSIS — Z1231 Encounter for screening mammogram for malignant neoplasm of breast: Secondary | ICD-10-CM

## 2016-12-22 DIAGNOSIS — Z23 Encounter for immunization: Secondary | ICD-10-CM | POA: Diagnosis not present

## 2016-12-22 DIAGNOSIS — Z0001 Encounter for general adult medical examination with abnormal findings: Secondary | ICD-10-CM | POA: Diagnosis not present

## 2016-12-22 DIAGNOSIS — F329 Major depressive disorder, single episode, unspecified: Secondary | ICD-10-CM

## 2016-12-22 DIAGNOSIS — Z0000 Encounter for general adult medical examination without abnormal findings: Secondary | ICD-10-CM

## 2016-12-22 MED ORDER — PAROXETINE HCL 40 MG PO TABS: 40.0000 mg | ORAL_TABLET | ORAL | 1 refills | Status: DC

## 2016-12-22 MED ORDER — HYDROCHLOROTHIAZIDE 25 MG PO TABS: 25.0000 mg | ORAL_TABLET | Freq: Every day | ORAL | 1 refills | Status: DC

## 2016-12-22 NOTE — Assessment & Plan Note (Signed)
Stable, continue present medications.   

## 2016-12-22 NOTE — Progress Notes (Signed)
BP 126/76 (BP Location: Left Arm, Patient Position: Sitting, Cuff Size: Large)   Pulse 80   Temp 98 F (36.7 C)   Ht 5' 1.2" (1.554 m)   Wt 185 lb 12.8 oz (84.3 kg)   LMP  (LMP Unknown)   SpO2 97%   BMI 34.88 kg/m    Subjective:    Patient ID: Lisa Bass, female    DOB: April 13, 1951, 66 y.o.   MRN: [id number]  HPI: Lisa Bass is a 66 y.o. female  Chief Complaint  Patient presents with  . Medicare Wellness   Functional Status Survey: Is the patient deaf or have difficulty hearing?: No Does the patient have difficulty seeing, even when wearing glasses/contacts?: Yes Does the patient have difficulty concentrating, remembering, or making decisions?: No Does the patient have difficulty walking or climbing stairs?: No Does the patient have difficulty dressing or bathing?: No Does the patient have difficulty doing errands alone such as visiting a doctor's office or shopping?: No  Fall Risk  12/22/2016 09/23/2015  Falls in the past year? No No   Social History   Social History  . Marital status: Married    Spouse name: N/A  . Number of children: N/A  . Years of education: N/A   Occupational History  . Not on file.   Social History Main Topics  . Smoking status: Never Smoker  . Smokeless tobacco: Never Used  . Alcohol use No  . Drug use: No  . Sexual activity: Yes   Other Topics Concern  . Not on file   Social History Narrative  . No narrative on file   Family History  Problem Relation Age of Onset  . Alcohol abuse Mother   . Hypertension Mother   . Dementia Father   . Hypertension Father   . Diabetes Father   . Diabetes Sister   . GI problems Daughter   . Heart disease Maternal Grandfather    Past Medical History:  Diagnosis Date  . Asthma   . CHF (congestive heart failure) ([place])   . Depression   . Hyperlipidemia   . Hypertension   . OCD (obsessive compulsive disorder)   . Palpitations   . Perimenopause    Past Surgical History:  Procedure  Laterality Date  . CHOLECYSTECTOMY    . open heart surgery    . ROOT CANAL     x2   Depression stable Depression screen [facility] 2/9 12/22/2016 03/23/2016 09/23/2015 08/09/2015  Decreased Interest 1 0 1 0  Down, Depressed, Hopeless 1 0 0 1  PHQ - 2 Score 2 0 1 1  Altered sleeping 1 1 - -  Tired, decreased energy 1 1 - -  Change in appetite 1 0 - -  Feeling bad or failure about yourself  1 0 - -  Trouble concentrating 1 0 - -  Moving slowly or fidgety/restless 1 0 - -  Suicidal thoughts 0 0 - -  PHQ-9 Score 8 2 - -   Hypertension Using medications without difficulty Average home BPs Not checking  No problems or lightheadedness No chest pain with exertion or shortness of breath No Edema   Relevant past medical, surgical, family and social history reviewed and updated as indicated. Interim medical history since our last visit reviewed. Allergies and medications reviewed and updated.  Review of Systems  Constitutional: Negative.   HENT: Negative.   Eyes: Negative.   Respiratory: Negative.   Cardiovascular: Negative.   Gastrointestinal: Negative.   Endocrine:  Negative.   Genitourinary: Negative.   Musculoskeletal: Negative.   Skin: Negative.   Allergic/Immunologic: Negative.   Neurological: Negative.   Hematological: Negative.   Psychiatric/Behavioral: Negative.     Per HPI unless specifically indicated above     Objective:    BP 126/76 (BP Location: Left Arm, Patient Position: Sitting, Cuff Size: Large)   Pulse 80   Temp 98 F (36.7 C)   Ht 5' 1.2" (1.554 m)   Wt 185 lb 12.8 oz (84.3 kg)   LMP  (LMP Unknown)   SpO2 97%   BMI 34.88 kg/m   Wt Readings from Last 3 Encounters:  12/22/16 185 lb 12.8 oz (84.3 kg)  07/29/16 192 lb (87.1 kg)  03/23/16 183 lb (83 kg)    Physical Exam  Constitutional: She is oriented to person, place, and time. She appears well-developed and well-nourished.  HENT:  Head: Normocephalic and atraumatic.  Eyes: Pupils are equal, round, and  reactive to light. Right eye exhibits no discharge. Left eye exhibits no discharge. No scleral icterus.  Neck: Normal range of motion. Neck supple. Carotid bruit is not present. No thyromegaly present.  Cardiovascular: Normal rate, regular rhythm and normal heart sounds.  Exam reveals no gallop and no friction rub.   No murmur heard. Pulmonary/Chest: Effort normal and breath sounds normal. No respiratory distress. She has no wheezes. She has no rales.  Abdominal: Soft. Bowel sounds are normal. There is no tenderness. There is no rebound.  Genitourinary: No breast swelling, tenderness or discharge.  Musculoskeletal: Normal range of motion.  Lymphadenopathy:    She has no cervical adenopathy.  Neurological: She is alert and oriented to person, place, and time.  Skin: Skin is warm, dry and intact. No rash noted.  Psychiatric: She has a normal mood and affect. Her speech is normal and behavior is normal. Judgment and thought content normal. Cognition and memory are normal.    Results for orders placed or performed during the hospital encounter of 07/29/16  CBC with Differential  Result Value Ref Range   WBC 13.7 (H) 3.6 - 11.0 K/uL   RBC 4.64 3.80 - 5.20 MIL/uL   Hemoglobin 13.4 12.0 - 16.0 g/dL   HCT 38.6 35.0 - 47.0 %   MCV 83.2 80.0 - 100.0 fL   MCH 28.8 26.0 - 34.0 pg   MCHC 34.7 32.0 - 36.0 g/dL   RDW 13.7 11.5 - 14.5 %   Platelets 255 150 - 440 K/uL   Neutrophils Relative % 77 %   Neutro Abs 10.5 (H) 1.4 - 6.5 K/uL   Lymphocytes Relative 14 %   Lymphs Abs 1.9 1.0 - 3.6 K/uL   Monocytes Relative 7 %   Monocytes Absolute 1.0 (H) 0.2 - 0.9 K/uL   Eosinophils Relative 1 %   Eosinophils Absolute 0.2 0 - 0.7 K/uL   Basophils Relative 1 %   Basophils Absolute 0.1 0 - 0.1 K/uL  Basic metabolic panel  Result Value Ref Range   Sodium 139 135 - 145 mmol/L   Potassium 3.2 (L) 3.5 - 5.1 mmol/L   Chloride 101 101 - 111 mmol/L   CO2 29 22 - 32 mmol/L   Glucose, Bld 129 (H) 65 - 99 mg/dL    BUN 13 6 - 20 mg/dL   Creatinine, Ser 1.06 (H) 0.44 - 1.00 mg/dL   Calcium 9.7 8.9 - 10.3 mg/dL   GFR calc non Af Amer 54 (L) >60 mL/min   GFR calc Af Amer >60 >60  mL/min   Anion gap 9 5 - 15      Assessment & Plan:   Problem List Items Addressed This Visit      Unprioritized   Hypertension   Relevant Medications   hydrochlorothiazide (HYDRODIURIL) 25 MG tablet   Other Relevant Orders   Comprehensive metabolic panel   Lipid Panel w/o Chol/HDL Ratio   Major depression, chronic    Stable, continue present medications.        Relevant Medications   PARoxetine (PAXIL) 40 MG tablet    Other Visit Diagnoses    Need for pneumococcal vaccination    -  Primary   Relevant Orders   Pneumococcal polysaccharide vaccine 23-valent greater than or equal to 2yo subcutaneous/IM (Completed)   Ovarian failure       Relevant Orders   DG Bone Density   Routine general medical examination at a health care facility       Encounter for screening mammogram for breast cancer       Relevant Orders   MM DIGITAL SCREENING BILATERAL       Follow up plan: Return in about 6 months (around 06/21/2017).

## 2016-12-22 NOTE — Progress Notes (Signed)
b

## 2016-12-22 NOTE — Patient Instructions (Addendum)
Pneumococcal Polysaccharide Vaccine: What You Need to Know 1. Why get vaccinated? Vaccination can protect older adults (and some children and younger adults) from pneumococcal disease. Pneumococcal disease is caused by bacteria that can spread from person to person through close contact. It can cause ear infections, and it can also lead to more serious infections of the:  Lungs (pneumonia),  Blood (bacteremia), and  Covering of the brain and spinal cord (meningitis). Meningitis can cause deafness and brain damage, and it can be fatal. Anyone can get pneumococcal disease, but children under 2 years of age, people with certain medical conditions, adults over 65 years of age, and cigarette smokers are at the highest risk. About 18,000 older adults die each year from pneumococcal disease in the [place]. Treatment of pneumococcal infections with penicillin and other drugs used to be more effective. But some strains of the disease have become resistant to these drugs. This makes prevention of the disease, through vaccination, even more important. 2. Pneumococcal polysaccharide vaccine (PPSV23) Pneumococcal polysaccharide vaccine (PPSV23) protects against 23 types of pneumococcal bacteria. It will not prevent all pneumococcal disease. PPSV23 is recommended for:  All adults 65 years of age and older,  Anyone 2 through 67 years of age with certain long-term health problems,  Anyone 2 through 67 years of age with a weakened immune system,  Adults 19 through 67 years of age who smoke cigarettes or have asthma. Most people need only one dose of PPSV. A second dose is recommended for certain high-risk groups. People 65 and older should get a dose even if they have gotten one or more doses of the vaccine before they turned 65. Your healthcare provider can give you more information about these recommendations. Most healthy adults develop protection within 2 to 3 weeks of getting the shot. 3. Some  people should not get this vaccine  Anyone who has had a life-threatening allergic reaction to PPSV should not get another dose.  Anyone who has a severe allergy to any component of PPSV should not receive it. Tell your provider if you have any severe allergies.  Anyone who is moderately or severely ill when the shot is scheduled may be asked to wait until they recover before getting the vaccine. Someone with a mild illness can usually be vaccinated.  Children less than 2 years of age should not receive this vaccine.  There is no evidence that PPSV is harmful to either a pregnant woman or to her fetus. However, as a precaution, women who need the vaccine should be vaccinated before becoming pregnant, if possible. 4. Risks of a vaccine reaction With any medicine, including vaccines, there is a chance of side effects. These are usually mild and go away on their own, but serious reactions are also possible. About half of people who get PPSV have mild side effects, such as redness or pain where the shot is given, which go away within about two days. Less than 1 out of 100 people develop a fever, muscle aches, or more severe local reactions. Problems that could happen after any vaccine:  People sometimes faint after a medical procedure, including vaccination. Sitting or lying down for about 15 minutes can help prevent fainting, and injuries caused by a fall. Tell your doctor if you feel dizzy, or have vision changes or ringing in the ears.  Some people get severe pain in the shoulder and have difficulty moving the arm where a shot was given. This happens very rarely.  Any medication can   cause a severe allergic reaction. Such reactions from a vaccine are very rare, estimated at about 1 in a million doses, and would happen within a few minutes to a few hours after the vaccination. As with any medicine, there is a very remote chance of a vaccine causing a serious injury or death. The safety of  vaccines is always being monitored. For more information, visit: http://www.aguilar.org/ 5. What if there is a serious reaction? What should I look for? Look for anything that concerns you, such as signs of a severe allergic reaction, very high fever, or unusual behavior. Signs of a severe allergic reaction can include hives, swelling of the face and throat, difficulty breathing, a fast heartbeat, dizziness, and weakness. These would usually start a few minutes to a few hours after the vaccination. What should I do? If you think it is a severe allergic reaction or other emergency that can't wait, call 9-1-1 or get to the nearest hospital. Otherwise, call your doctor. Afterward, the reaction should be reported to the Vaccine Adverse Event Reporting System (VAERS). Your doctor might file this report, or you can do it yourself through the VAERS web site at www.vaers.SamedayNews.es, or by calling 540-747-7633. VAERS does not give medical advice. 6. How can I learn more?  Ask your doctor. He or she can give you the vaccine package insert or suggest other sources of information.  Call your local or state health department.  Contact the Centers for Disease Control and Prevention (CDC):  Call 984-613-9184 (1-800-CDC-INFO) or  Visit CDC's website at http://hunter.com/ CDC Pneumococcal Polysaccharide Vaccine VIS (04/13/14) This information is not intended to replace advice given to you by your health care provider. Make sure you discuss any questions you have with your health care provider. ----------------------------------------------------------------------  Please do call to schedule your mammogram; the number to schedule one at either [facility] or [facility] is [phone].

## 2016-12-23 LAB — COMPREHENSIVE METABOLIC PANEL
A/G RATIO: 1.2 (ref 1.2–2.2)
ALK PHOS: 96 IU/L (ref 39–117)
ALT: 28 IU/L (ref 0–32)
AST: 26 IU/L (ref 0–40)
Albumin: 4 g/dL (ref 3.6–4.8)
BILIRUBIN TOTAL: 0.4 mg/dL (ref 0.0–1.2)
BUN/Creatinine Ratio: 12 (ref 12–28)
BUN: 11 mg/dL (ref 8–27)
CO2: 31 mmol/L — ABNORMAL HIGH (ref 18–29)
Calcium: 9.9 mg/dL (ref 8.7–10.3)
Chloride: 97 mmol/L (ref 96–106)
Creatinine, Ser: 0.9 mg/dL (ref 0.57–1.00)
GFR calc Af Amer: 77 mL/min/{1.73_m2} (ref >59)
GFR, EST NON AFRICAN AMERICAN: 67 mL/min/{1.73_m2} (ref >59)
GLOBULIN, TOTAL: 3.3 g/dL (ref 1.5–4.5)
Glucose: 102 mg/dL — ABNORMAL HIGH (ref 65–99)
POTASSIUM: 3.6 mmol/L (ref 3.5–5.2)
SODIUM: 142 mmol/L (ref 134–144)
Total Protein: 7.3 g/dL (ref 6.0–8.5)

## 2016-12-23 LAB — LIPID PANEL W/O CHOL/HDL RATIO
CHOLESTEROL TOTAL: 184 mg/dL (ref 100–199)
HDL: 39 mg/dL — ABNORMAL LOW (ref >39)
LDL Calculated: 93 mg/dL (ref 0–99)
TRIGLYCERIDES: 258 mg/dL — AB (ref 0–149)
VLDL Cholesterol Cal: 52 mg/dL — ABNORMAL HIGH (ref 5–40)

## 2017-04-05 ENCOUNTER — Other Ambulatory Visit: Payer: Self-pay | Admitting: Unknown Physician Specialty

## 2017-06-14 DIAGNOSIS — D72829 Elevated white blood cell count, unspecified: Secondary | ICD-10-CM | POA: Diagnosis not present

## 2017-06-14 DIAGNOSIS — I471 Supraventricular tachycardia: Secondary | ICD-10-CM | POA: Diagnosis not present

## 2017-06-14 DIAGNOSIS — I499 Cardiac arrhythmia, unspecified: Secondary | ICD-10-CM | POA: Diagnosis not present

## 2017-06-14 DIAGNOSIS — Z880 Allergy status to penicillin: Secondary | ICD-10-CM | POA: Diagnosis not present

## 2017-06-14 DIAGNOSIS — R4781 Slurred speech: Secondary | ICD-10-CM | POA: Diagnosis not present

## 2017-06-14 DIAGNOSIS — I491 Atrial premature depolarization: Secondary | ICD-10-CM | POA: Diagnosis not present

## 2017-06-14 DIAGNOSIS — R464 Slowness and poor responsiveness: Secondary | ICD-10-CM | POA: Diagnosis not present

## 2017-06-14 DIAGNOSIS — Z79899 Other long term (current) drug therapy: Secondary | ICD-10-CM | POA: Diagnosis not present

## 2017-06-16 ENCOUNTER — Encounter: Payer: Self-pay | Admitting: Unknown Physician Specialty

## 2017-06-16 ENCOUNTER — Ambulatory Visit (INDEPENDENT_AMBULATORY_CARE_PROVIDER_SITE_OTHER): Payer: Medicare Other | Admitting: Unknown Physician Specialty

## 2017-06-16 DIAGNOSIS — F341 Dysthymic disorder: Secondary | ICD-10-CM | POA: Diagnosis not present

## 2017-06-16 DIAGNOSIS — F329 Major depressive disorder, single episode, unspecified: Secondary | ICD-10-CM

## 2017-06-16 MED ORDER — DULOXETINE HCL 60 MG PO CPEP: 60.0000 mg | ORAL_CAPSULE | Freq: Every day | ORAL | 3 refills | Status: DC

## 2017-06-16 MED ORDER — PROPRANOLOL HCL ER 60 MG PO CP24: 60.0000 mg | ORAL_CAPSULE | Freq: Every day | ORAL | 1 refills | Status: DC

## 2017-06-16 NOTE — Assessment & Plan Note (Signed)
Pt is havening worsening depression.  She is not willing to go to psychiatry or counseling.  She feels better on the Paxil 40 mg but would like to switch.  Will switch from Paxil to Cymbalta.

## 2017-06-16 NOTE — Progress Notes (Signed)
BP 135/74   Pulse 92   Temp 98.4 F (36.9 C)   Ht 5' 0.3" (1.532 m)   Wt 171 lb 6.4 oz (77.7 kg)   LMP  (LMP Unknown)   SpO2 97%   BMI 33.14 kg/m    Subjective:    Patient ID: Lisa Bass, female    DOB: 1951/10/08, 66 y.o.   MRN: [id number]  HPI: Lisa Bass is a 66 y.o. female  Chief Complaint  Patient presents with  . Depression   Pt states she is having a hard time lately.  States symptoms started following retiring from work.  Lots of anxiety.  Her chief complaint is difficulty remembering and concentrating.  This has been going on for a while.  She is having trouble managing multiple tasks while at home.  She is not currently driving and waiting to "get myself together."  States her head is "fuzzy."  She feels the problem is with depression.  She lost 14 pounds due to decreased appetite.  She went to [facility] ER the night before last since her grandchildren saw her upset.  State she feels better with the Paxil increase.   Depression screen [facility] 2/9 06/16/2017 12/22/2016 03/23/2016 09/23/2015 08/09/2015  Decreased Interest 2 1 0 1 0  Down, Depressed, Hopeless 1 1 0 0 1  PHQ - 2 Score 3 2 0 1 1  Altered sleeping 1 1 1  - -  Tired, decreased energy 1 1 1  - -  Change in appetite 2 1 0 - -  Feeling bad or failure about yourself  1 1 0 - -  Trouble concentrating 3 1 0 - -  Moving slowly or fidgety/restless 1 1 0 - -  Suicidal thoughts 0 0 0 - -  PHQ-9 Score 12 8 2  - -   ER notes reviewed.  Labs were normal except for a mildly low potassium.    Family History  Problem Relation Age of Onset  . Alcohol abuse Mother   . Hypertension Mother   . Dementia Father   . Hypertension Father   . Diabetes Father   . Diabetes Sister   . GI problems Daughter   . Heart disease Maternal Grandfather    Social History   Social History  . Marital status: Married    Spouse name: N/A  . Number of children: N/A  . Years of education: N/A   Occupational History  . Not on file.    Social History Main Topics  . Smoking status: Never Smoker  . Smokeless tobacco: Never Used  . Alcohol use No  . Drug use: No  . Sexual activity: Yes   Other Topics Concern  . Not on file   Social History Narrative  . No narrative on file   Past Medical History:  Diagnosis Date  . Asthma   . CHF (congestive heart failure) ([place])   . Depression   . Hyperlipidemia   . Hypertension   . OCD (obsessive compulsive disorder)   . Palpitations   . Perimenopause    Past Surgical History:  Procedure Laterality Date  . CHOLECYSTECTOMY    . open heart surgery    . ROOT CANAL     x2    Relevant past medical, surgical, family and social history reviewed and updated as indicated. Interim medical history since our last visit reviewed. Allergies and medications reviewed and updated.  Review of Systems  Per HPI unless specifically indicated above  Objective:    BP 135/74   Pulse 92   Temp 98.4 F (36.9 C)   Ht 5' 0.3" (1.532 m)   Wt 171 lb 6.4 oz (77.7 kg)   LMP  (LMP Unknown)   SpO2 97%   BMI 33.14 kg/m   Wt Readings from Last 3 Encounters:  06/16/17 171 lb 6.4 oz (77.7 kg)  12/22/16 185 lb 12.8 oz (84.3 kg)  07/29/16 192 lb (87.1 kg)    Physical Exam  Constitutional: She is oriented to person, place, and time. She appears well-developed and well-nourished. No distress.  HENT:  Head: Normocephalic and atraumatic.  Eyes: Conjunctivae and lids are normal. Right eye exhibits no discharge. Left eye exhibits no discharge. No scleral icterus.  Neck: Normal range of motion. Neck supple. No JVD present. Carotid bruit is not present.  Cardiovascular: Normal rate, regular rhythm and normal heart sounds.   Pulmonary/Chest: Effort normal and breath sounds normal.  Abdominal: Normal appearance. There is no splenomegaly or hepatomegaly.  Musculoskeletal: Normal range of motion.  Neurological: She is alert and oriented to person, place, and time.  Skin: Skin is warm, dry and  intact. No rash noted. No pallor.  Psychiatric: She has a normal mood and affect. Her behavior is normal. Judgment and thought content normal.    Results for orders placed or performed in visit on 12/22/16  Comprehensive metabolic panel  Result Value Ref Range   Glucose 102 (H) 65 - 99 mg/dL   BUN 11 8 - 27 mg/dL   Creatinine, Ser 0.90 0.57 - 1.00 mg/dL   GFR calc non Af Amer 67 >59 mL/min/1.73   GFR calc Af Amer 77 >59 mL/min/1.73   BUN/Creatinine Ratio 12 12 - 28   Sodium 142 134 - 144 mmol/L   Potassium 3.6 3.5 - 5.2 mmol/L   Chloride 97 96 - 106 mmol/L   CO2 31 (H) 18 - 29 mmol/L   Calcium 9.9 8.7 - 10.3 mg/dL   Total Protein 7.3 6.0 - 8.5 g/dL   Albumin 4.0 3.6 - 4.8 g/dL   Globulin, Total 3.3 1.5 - 4.5 g/dL   Albumin/Globulin Ratio 1.2 1.2 - 2.2   Bilirubin Total 0.4 0.0 - 1.2 mg/dL   Alkaline Phosphatase 96 39 - 117 IU/L   AST 26 0 - 40 IU/L   ALT 28 0 - 32 IU/L  Lipid Panel w/o Chol/HDL Ratio  Result Value Ref Range   Cholesterol, Total 184 100 - 199 mg/dL   Triglycerides 258 (H) 0 - 149 mg/dL   HDL 39 (L) >39 mg/dL   VLDL Cholesterol Cal 52 (H) 5 - 40 mg/dL   LDL Calculated 93 0 - 99 mg/dL      Assessment & Plan:   Problem List Items Addressed This Visit      Unprioritized   Major depression, chronic    Pt is havening worsening depression.  She is not willing to go to psychiatry or counseling.  She feels better on the Paxil 40 mg but would like to switch.  Will switch from Paxil to Cymbalta.        Relevant Medications   DULoxetine (CYMBALTA) 60 MG capsule       Follow up plan: Return for 1-2 weeks.

## 2017-06-30 ENCOUNTER — Ambulatory Visit (INDEPENDENT_AMBULATORY_CARE_PROVIDER_SITE_OTHER): Payer: Medicare Other | Admitting: Unknown Physician Specialty

## 2017-06-30 ENCOUNTER — Encounter: Payer: Self-pay | Admitting: Unknown Physician Specialty

## 2017-06-30 VITALS — BP 119/73 | HR 80 | Temp 98.3°F | Wt 179.8 lb

## 2017-06-30 DIAGNOSIS — Z1211 Encounter for screening for malignant neoplasm of colon: Secondary | ICD-10-CM

## 2017-06-30 DIAGNOSIS — F341 Dysthymic disorder: Secondary | ICD-10-CM

## 2017-06-30 DIAGNOSIS — F329 Major depressive disorder, single episode, unspecified: Secondary | ICD-10-CM

## 2017-06-30 NOTE — Progress Notes (Signed)
BP 119/73   Pulse 80   Temp 98.3 F (36.8 C)   Wt 179 lb 12.8 oz (81.6 kg)   LMP  (LMP Unknown)   SpO2 97%   BMI 34.77 kg/m    Subjective:    Patient ID: Lisa Bass, female    DOB: February 11, 1951, 66 y.o.   MRN: [id number]  HPI: Lisa Bass is a 67 y.o. female  Chief Complaint  Patient presents with  . Depression    2 week f/up   Depression Pt is doing much better with her depression.  She has energy.  Rather than taking the Cymbalta, she just took the increased dose of Paxil recommended a couple of years ago.   Depression screen [facility] 2/9 06/30/2017 06/16/2017 12/22/2016 03/23/2016 09/23/2015  Decreased Interest 1 2 1  0 1  Down, Depressed, Hopeless 1 1 1  0 0  PHQ - 2 Score 2 3 2  0 1  Altered sleeping 0 1 1 1  -  Tired, decreased energy 0 1 1 1  -  Change in appetite 0 2 1 0 -  Feeling bad or failure about yourself  0 1 1 0 -  Trouble concentrating 0 3 1 0 -  Moving slowly or fidgety/restless 0 1 1 0 -  Suicidal thoughts 0 0 0 0 -  PHQ-9 Score 2 12 8 2  -     Relevant past medical, surgical, family and social history reviewed and updated as indicated. Interim medical history since our last visit reviewed. Allergies and medications reviewed and updated.  Review of Systems  Per HPI unless specifically indicated above     Objective:    BP 119/73   Pulse 80   Temp 98.3 F (36.8 C)   Wt 179 lb 12.8 oz (81.6 kg)   LMP  (LMP Unknown)   SpO2 97%   BMI 34.77 kg/m   Wt Readings from Last 3 Encounters:  06/30/17 179 lb 12.8 oz (81.6 kg)  06/16/17 171 lb 6.4 oz (77.7 kg)  12/22/16 185 lb 12.8 oz (84.3 kg)    Physical Exam  Constitutional: She is oriented to person, place, and time. She appears well-developed and well-nourished. No distress.  HENT:  Head: Normocephalic and atraumatic.  Eyes: Conjunctivae and lids are normal. Right eye exhibits no discharge. Left eye exhibits no discharge. No scleral icterus.  Cardiovascular: Normal rate.   Pulmonary/Chest: Effort  normal.  Abdominal: Normal appearance. There is no splenomegaly or hepatomegaly.  Musculoskeletal: Normal range of motion.  Neurological: She is alert and oriented to person, place, and time.  Skin: Skin is intact. No rash noted. No pallor.  Psychiatric: She has a normal mood and affect. Her behavior is normal. Judgment and thought content normal.    Results for orders placed or performed in visit on 12/22/16  Comprehensive metabolic panel  Result Value Ref Range   Glucose 102 (H) 65 - 99 mg/dL   BUN 11 8 - 27 mg/dL   Creatinine, Ser 0.90 0.57 - 1.00 mg/dL   GFR calc non Af Amer 67 >59 mL/min/1.73   GFR calc Af Amer 77 >59 mL/min/1.73   BUN/Creatinine Ratio 12 12 - 28   Sodium 142 134 - 144 mmol/L   Potassium 3.6 3.5 - 5.2 mmol/L   Chloride 97 96 - 106 mmol/L   CO2 31 (H) 18 - 29 mmol/L   Calcium 9.9 8.7 - 10.3 mg/dL   Total Protein 7.3 6.0 - 8.5 g/dL   Albumin 4.0 3.6 - 4.8  g/dL   Globulin, Total 3.3 1.5 - 4.5 g/dL   Albumin/Globulin Ratio 1.2 1.2 - 2.2   Bilirubin Total 0.4 0.0 - 1.2 mg/dL   Alkaline Phosphatase 96 39 - 117 IU/L   AST 26 0 - 40 IU/L   ALT 28 0 - 32 IU/L  Lipid Panel w/o Chol/HDL Ratio  Result Value Ref Range   Cholesterol, Total 184 100 - 199 mg/dL   Triglycerides 258 (H) 0 - 149 mg/dL   HDL 39 (L) >39 mg/dL   VLDL Cholesterol Cal 52 (H) 5 - 40 mg/dL   LDL Calculated 93 0 - 99 mg/dL      Assessment & Plan:   Problem List Items Addressed This Visit      Unprioritized   Major depression, chronic    Much better and 80% better.  Continue present meds      Relevant Medications   PARoxetine (PAXIL) 40 MG tablet    Other Visit Diagnoses    Colon cancer screening    -  Primary   Relevant Orders   Cologuard       Follow up plan: Return in about 6 months (around 12/31/2017), or if symptoms worsen or fail to improve, for PE.

## 2017-06-30 NOTE — Assessment & Plan Note (Signed)
Much better and 80% better.  Continue present meds

## 2017-07-01 ENCOUNTER — Telehealth: Payer: Self-pay | Admitting: Unknown Physician Specialty

## 2017-07-01 NOTE — Telephone Encounter (Signed)
Left message regarding patient needing to schedule a follow up appt. Patient checked out while computers were down could not schedule the follow up appt

## 2017-07-20 ENCOUNTER — Encounter: Payer: Self-pay | Admitting: Unknown Physician Specialty

## 2017-07-20 ENCOUNTER — Ambulatory Visit (INDEPENDENT_AMBULATORY_CARE_PROVIDER_SITE_OTHER): Payer: Medicare Other | Admitting: Unknown Physician Specialty

## 2017-07-20 DIAGNOSIS — F341 Dysthymic disorder: Secondary | ICD-10-CM | POA: Diagnosis not present

## 2017-07-20 DIAGNOSIS — F329 Major depressive disorder, single episode, unspecified: Secondary | ICD-10-CM

## 2017-07-20 MED ORDER — ARIPIPRAZOLE 2 MG PO TABS: 2.0000 mg | ORAL_TABLET | Freq: Every day | ORAL | 0 refills | Status: DC

## 2017-07-20 NOTE — Assessment & Plan Note (Addendum)
?   Bipolar illness.  Decrease Paxil to 20 mg. And start Abilify.  Number given for [facility] for further management. Number given

## 2017-07-20 NOTE — Progress Notes (Signed)
BP (!) 151/85 (BP Location: Left Arm, Cuff Size: Large)   Pulse 71   Temp 98.1 F (36.7 C)   Wt 171 lb (77.6 kg)   LMP  (LMP Unknown)   SpO2 97%   BMI 33.06 kg/m    Subjective:    Patient ID: Lisa Bass, female    DOB: [birth year], 66 y.o.   MRN: [id number]  HPI: Lisa Bass is a 66 y.o. female  Chief Complaint  Patient presents with  . Anxiety    pt states she is here to f/up on her medication   Pt is here with her husband.   Pt states she has mostly good days but having bad days where she can't stop crying.  This was worse yesterday and last night.  States she is mostly concerned that she has early Alzheimer's.  States she has trouble concentrating "on anything."  She also states she is forgetful.  She feels "it isn't fair to my family" my being upset all the time.  Reviewed history:  Seen in ER and Paxil increased--Seen 6/27 and and was tearful and depressed---Seen 7/11 and was cheerful and feeling 100% ----Seen today and tearful  Admits to family sister and father with biplolar illness.  MDQ with 6 yes answers.    Depression screen [facility] 2/9 07/20/2017 06/30/2017 06/16/2017 12/22/2016 03/23/2016  Decreased Interest 1 1 2 1  0  Down, Depressed, Hopeless 2 1 1 1  0  PHQ - 2 Score 3 2 3 2  0  Altered sleeping 1 0 1 1 1   Tired, decreased energy 0 0 1 1 1   Change in appetite 1 0 2 1 0  Feeling bad or failure about yourself  1 0 1 1 0  Trouble concentrating 2 0 3 1 0  Moving slowly or fidgety/restless 3 0 1 1 0  Suicidal thoughts 0 0 0 0 0  PHQ-9 Score 11 2 12 8 2      Relevant past medical, surgical, family and social history reviewed and updated as indicated. Interim medical history since our last visit reviewed. Allergies and medications reviewed and updated.  Review of Systems  Per HPI unless specifically indicated above     Objective:    BP (!) 151/85 (BP Location: Left Arm, Cuff Size: Large)   Pulse 71   Temp 98.1 F (36.7 C)   Wt 171 lb (77.6 kg)   LMP  (LMP  Unknown)   SpO2 97%   BMI 33.06 kg/m   Wt Readings from Last 3 Encounters:  07/20/17 171 lb (77.6 kg)  06/30/17 179 lb 12.8 oz (81.6 kg)  06/16/17 171 lb 6.4 oz (77.7 kg)    Physical Exam  Constitutional: She is oriented to person, place, and time. She appears well-developed and well-nourished. No distress.  HENT:  Head: Normocephalic and atraumatic.  Eyes: Conjunctivae and lids are normal. Right eye exhibits no discharge. Left eye exhibits no discharge. No scleral icterus.  Neck: Normal range of motion. Neck supple. No JVD present. Carotid bruit is not present.  Cardiovascular: Normal rate, regular rhythm and normal heart sounds.   Pulmonary/Chest: Effort normal and breath sounds normal.  Abdominal: Normal appearance. There is no splenomegaly or hepatomegaly.  Musculoskeletal: Normal range of motion.  Neurological: She is alert and oriented to person, place, and time.  Skin: Skin is warm, dry and intact. No rash noted. No pallor.  Psychiatric: She has a normal mood and affect. Her behavior is normal. Judgment and thought content normal.  Results for orders placed or performed in visit on 12/22/16  Comprehensive metabolic panel  Result Value Ref Range   Glucose 102 (H) 65 - 99 mg/dL   BUN 11 8 - 27 mg/dL   Creatinine, Ser 0.90 0.57 - 1.00 mg/dL   GFR calc non Af Amer 67 >59 mL/min/1.73   GFR calc Af Amer 77 >59 mL/min/1.73   BUN/Creatinine Ratio 12 12 - 28   Sodium 142 134 - 144 mmol/L   Potassium 3.6 3.5 - 5.2 mmol/L   Chloride 97 96 - 106 mmol/L   CO2 31 (H) 18 - 29 mmol/L   Calcium 9.9 8.7 - 10.3 mg/dL   Total Protein 7.3 6.0 - 8.5 g/dL   Albumin 4.0 3.6 - 4.8 g/dL   Globulin, Total 3.3 1.5 - 4.5 g/dL   Albumin/Globulin Ratio 1.2 1.2 - 2.2   Bilirubin Total 0.4 0.0 - 1.2 mg/dL   Alkaline Phosphatase 96 39 - 117 IU/L   AST 26 0 - 40 IU/L   ALT 28 0 - 32 IU/L  Lipid Panel w/o Chol/HDL Ratio  Result Value Ref Range   Cholesterol, Total 184 100 - 199 mg/dL    Triglycerides 258 (H) 0 - 149 mg/dL   HDL 39 (L) >39 mg/dL   VLDL Cholesterol Cal 52 (H) 5 - 40 mg/dL   LDL Calculated 93 0 - 99 mg/dL      Assessment & Plan:   Problem List Items Addressed This Visit      Unprioritized   Major depression, chronic    ? Bipolar illness.  Decrease Paxil to 20 mg. And start Abilify.  Number given for [facility] for further management. Number given          Follow up plan: Return in about 1 week (around 07/27/2017).

## 2017-07-20 NOTE — Patient Instructions (Addendum)
Call [facility]: [phone]  Decrease Paxil to 20 mg and start Abilify 2 mg.  (called into pharmacy)

## 2017-07-25 DIAGNOSIS — Z1211 Encounter for screening for malignant neoplasm of colon: Secondary | ICD-10-CM | POA: Diagnosis not present

## 2017-07-25 DIAGNOSIS — Z1212 Encounter for screening for malignant neoplasm of rectum: Secondary | ICD-10-CM | POA: Diagnosis not present

## 2017-07-25 LAB — COLOGUARD: COLOGUARD: POSITIVE

## 2017-07-27 ENCOUNTER — Ambulatory Visit (INDEPENDENT_AMBULATORY_CARE_PROVIDER_SITE_OTHER): Payer: Medicare Other | Admitting: Unknown Physician Specialty

## 2017-07-27 ENCOUNTER — Encounter: Payer: Self-pay | Admitting: Unknown Physician Specialty

## 2017-07-27 DIAGNOSIS — I10 Essential (primary) hypertension: Secondary | ICD-10-CM

## 2017-07-27 DIAGNOSIS — F329 Major depressive disorder, single episode, unspecified: Secondary | ICD-10-CM

## 2017-07-27 DIAGNOSIS — R251 Tremor, unspecified: Secondary | ICD-10-CM

## 2017-07-27 DIAGNOSIS — F341 Dysthymic disorder: Secondary | ICD-10-CM

## 2017-07-27 MED ORDER — METOPROLOL SUCCINATE ER 50 MG PO TB24: 50.0000 mg | ORAL_TABLET | Freq: Every day | ORAL | 1 refills | Status: DC

## 2017-07-27 NOTE — Assessment & Plan Note (Signed)
Add beta blocker for high BP and fine generalized tremors.

## 2017-07-27 NOTE — Assessment & Plan Note (Signed)
Generalized tremors.  Start beta blocker.  Consider neurology referral

## 2017-07-27 NOTE — Progress Notes (Signed)
BP (!) 164/98 (BP Location: Left Arm, Cuff Size: Normal)   Pulse 80   Temp 98.4 F (36.9 C)   Wt 173 lb 3.2 oz (78.6 kg)   LMP  (LMP Unknown)   SpO2 97%   BMI 33.49 kg/m    Subjective:    Patient ID: Lisa Bass, female    DOB: 07-12-1951, 66 y.o.   MRN: [id number]  HPI: Lisa Bass is a 66 y.o. female  Chief Complaint  Patient presents with  . Depression   Depression Pt states she is doing much better with the current medication changes.  States she is doing less switching.  Feels she is doing well and beginning to drive.  She has not contacted [place] as she feels that she is doing well.   Depression screen [facility] 2/9 07/27/2017 07/20/2017 06/30/2017 06/16/2017 12/22/2016  Decreased Interest 1 1 1 2 1   Down, Depressed, Hopeless 1 2 1 1 1   PHQ - 2 Score 2 3 2 3 2   Altered sleeping 1 1 0 1 1  Tired, decreased energy 0 0 0 1 1  Change in appetite 1 1 0 2 1  Feeling bad or failure about yourself  1 1 0 1 1  Trouble concentrating 1 2 0 3 1  Moving slowly or fidgety/restless 1 3 0 1 1  Suicidal thoughts 0 0 0 0 0  PHQ-9 Score 7 11 2 12 8    Hypertension Using medications without difficulty and taking 1/2 HCTZ due to history of electrolyte inbalance Average home BPs Not checking   No problems or lightheadedness No chest pain with exertion or shortness of breath No Edema  Relevant past medical, surgical, family and social history reviewed and updated as indicated. Interim medical history since our last visit reviewed. Allergies and medications reviewed and updated.  Review of Systems  Per HPI unless specifically indicated above     Objective:    BP (!) 164/98 (BP Location: Left Arm, Cuff Size: Normal)   Pulse 80   Temp 98.4 F (36.9 C)   Wt 173 lb 3.2 oz (78.6 kg)   LMP  (LMP Unknown)   SpO2 97%   BMI 33.49 kg/m   Wt Readings from Last 3 Encounters:  07/27/17 173 lb 3.2 oz (78.6 kg)  07/20/17 171 lb (77.6 kg)  06/30/17 179 lb 12.8 oz (81.6 kg)    Physical Exam  Constitutional: She is oriented to person, place, and time. She appears well-developed and well-nourished. No distress.  HENT:  Head: Normocephalic and atraumatic.  Eyes: Conjunctivae and lids are normal. Right eye exhibits no discharge. Left eye exhibits no discharge. No scleral icterus.  Neck: Normal range of motion. Neck supple. No JVD present. Carotid bruit is not present.  Cardiovascular: Normal rate, regular rhythm and normal heart sounds.   Pulmonary/Chest: Effort normal and breath sounds normal.  Abdominal: Normal appearance. There is no splenomegaly or hepatomegaly.  Musculoskeletal: Normal range of motion.  Neurological: She is alert and oriented to person, place, and time. She displays tremor.  Skin: Skin is warm, dry and intact. No rash noted. No pallor.  Psychiatric: She has a normal mood and affect. Her behavior is normal. Judgment and thought content normal.    Results for orders placed or performed in visit on 12/22/16  Comprehensive metabolic panel  Result Value Ref Range   Glucose 102 (H) 65 - 99 mg/dL   BUN 11 8 - 27 mg/dL   Creatinine, Ser 0.90 0.57 -  1.00 mg/dL   GFR calc non Af Amer 67 >59 mL/min/1.73   GFR calc Af Amer 77 >59 mL/min/1.73   BUN/Creatinine Ratio 12 12 - 28   Sodium 142 134 - 144 mmol/L   Potassium 3.6 3.5 - 5.2 mmol/L   Chloride 97 96 - 106 mmol/L   CO2 31 (H) 18 - 29 mmol/L   Calcium 9.9 8.7 - 10.3 mg/dL   Total Protein 7.3 6.0 - 8.5 g/dL   Albumin 4.0 3.6 - 4.8 g/dL   Globulin, Total 3.3 1.5 - 4.5 g/dL   Albumin/Globulin Ratio 1.2 1.2 - 2.2   Bilirubin Total 0.4 0.0 - 1.2 mg/dL   Alkaline Phosphatase 96 39 - 117 IU/L   AST 26 0 - 40 IU/L   ALT 28 0 - 32 IU/L  Lipid Panel w/o Chol/HDL Ratio  Result Value Ref Range   Cholesterol, Total 184 100 - 199 mg/dL   Triglycerides 258 (H) 0 - 149 mg/dL   HDL 39 (L) >39 mg/dL   VLDL Cholesterol Cal 52 (H) 5 - 40 mg/dL   LDL Calculated 93 0 - 99 mg/dL      Assessment & Plan:    Problem List Items Addressed This Visit      Unprioritized   Hypertension    Add beta blocker for high BP and fine generalized tremors.        Relevant Medications   metoprolol succinate (TOPROL-XL) 50 MG 24 hr tablet   Major depression, chronic    ? Bipolar.  Will continue same treatment and recheck next month      Tremor    Generalized tremors.  Start beta blocker.  Consider neurology referral          Follow up plan: Return in about 4 weeks (around 08/24/2017).

## 2017-07-27 NOTE — Assessment & Plan Note (Signed)
?   Bipolar.  Will continue same treatment and recheck next month

## 2017-08-02 ENCOUNTER — Telehealth: Payer: Self-pay | Admitting: Unknown Physician Specialty

## 2017-08-02 DIAGNOSIS — R195 Other fecal abnormalities: Secondary | ICD-10-CM | POA: Insufficient documentation

## 2017-08-02 NOTE — Telephone Encounter (Signed)
Referral directed to [facility].

## 2017-08-02 NOTE — Telephone Encounter (Signed)
Discussed about positive cologuard.  Will refer to Gastroenterology for colonoscopy

## 2017-08-11 ENCOUNTER — Telehealth: Payer: Self-pay

## 2017-08-11 NOTE — Telephone Encounter (Signed)
Gastroenterology Pre-Procedure Review   PATIENT REVIEW QUESTIONS: The patient responded to the following health history questions as indicated:    * patient had recent positive Colo guard test* 1. Are you having any GI issues? No  2. Do you have a personal history of Polyps? No  3. Do you have a family history of Colon Cancer or Polyps? No  4. Diabetes Mellitus? No  5. Joint replacements in the past 12 months? No  6. Major health problems in the past 3 months?  7. Any artificial heart valves, MVP, or defibrillator? No  8. Are you being treated for any major illnesses? No  MEDICATIONS & ALLERGIES:    Patient reports the following regarding taking any anticoagulation/antiplatelet therapy:   Plavix, Coumadin, Eliquis, Xarelto, Lovenox, Pradaxa, Brilinta, or Effient? No  Aspirin? Yes, 81 mg Aspirin daily  Patient confirms/reports the following medications:  Current Outpatient Prescriptions  Medication Sig Dispense Refill  . ARIPiprazole (ABILIFY) 2 MG tablet Take 1 tablet (2 mg total) by mouth daily. 30 tablet 0  . aspirin 81 MG tablet Take 81 mg by mouth daily.    . hydrochlorothiazide (HYDRODIURIL) 25 MG tablet TAKE ONE TABLET BY MOUTH EVERY DAY (Patient taking differently: TAKE ONE HALF TABLET BY MOUTH EVERY DAY) 90 tablet 1  . metoprolol succinate (TOPROL-XL) 50 MG 24 hr tablet Take 1 tablet (50 mg total) by mouth daily. Take with or immediately following a meal. 30 tablet 1  . PARoxetine (PAXIL) 40 MG tablet Take 20 mg by mouth daily. Take 1/2 dose     No current facility-administered medications for this visit.     Patient confirms/reports the following allergies:  Allergies  Allergen Reactions  . Penicillins Anaphylaxis  . Biaxin [Clarithromycin] Other (See Comments)    GI issues  . Lisinopril Diarrhea    No orders of the defined types were placed in this encounter.   AUTHORIZATION INFORMATION Primary Insurance: 1D#: Group #:  Secondary Insurance: 1D#: Group  #:  SCHEDULE INFORMATION: Date: 09/07/17 Time: Location: [place]

## 2017-08-12 ENCOUNTER — Other Ambulatory Visit: Payer: Self-pay

## 2017-08-12 DIAGNOSIS — Z1211 Encounter for screening for malignant neoplasm of colon: Secondary | ICD-10-CM

## 2017-08-12 DIAGNOSIS — Z1212 Encounter for screening for malignant neoplasm of rectum: Principal | ICD-10-CM

## 2017-08-19 ENCOUNTER — Other Ambulatory Visit: Payer: Self-pay | Admitting: Unknown Physician Specialty

## 2017-08-24 ENCOUNTER — Encounter: Payer: Self-pay | Admitting: Unknown Physician Specialty

## 2017-08-24 ENCOUNTER — Ambulatory Visit (INDEPENDENT_AMBULATORY_CARE_PROVIDER_SITE_OTHER): Payer: Medicare Other | Admitting: Unknown Physician Specialty

## 2017-08-24 DIAGNOSIS — I10 Essential (primary) hypertension: Secondary | ICD-10-CM | POA: Diagnosis not present

## 2017-08-24 DIAGNOSIS — F341 Dysthymic disorder: Secondary | ICD-10-CM | POA: Diagnosis not present

## 2017-08-24 DIAGNOSIS — F329 Major depressive disorder, single episode, unspecified: Secondary | ICD-10-CM

## 2017-08-24 NOTE — Assessment & Plan Note (Addendum)
Not to goal.  Increase HCTZ to 25 mg from 12.5 mg.  Encouraged to get home numbers.  Dash diet

## 2017-08-24 NOTE — Patient Instructions (Addendum)
DASH Eating Plan DASH stands for "Dietary Approaches to Stop Hypertension." The DASH eating plan is a healthy eating plan that has been shown to reduce high blood pressure (hypertension). It may also reduce your risk for type 2 diabetes, heart disease, and stroke. The DASH eating plan may also help with weight loss. What are tips for following this plan? General guidelines  Avoid eating more than 2,300 mg (milligrams) of salt (sodium) a day. If you have hypertension, you may need to reduce your sodium intake to 1,500 mg a day.  Limit alcohol intake to no more than 1 drink a day for nonpregnant women and 2 drinks a day for men. One drink equals 12 oz of beer, 5 oz of wine, or 1 oz of hard liquor.  Work with your health care provider to maintain a healthy body weight or to lose weight. Ask what an ideal weight is for you.  Get at least 30 minutes of exercise that causes your heart to beat faster (aerobic exercise) most days of the week. Activities may include walking, swimming, or biking.  Work with your health care provider or diet and nutrition specialist (dietitian) to adjust your eating plan to your individual calorie needs. Reading food labels  Check food labels for the amount of sodium per serving. Choose foods with less than 5 percent of the Daily Value of sodium. Generally, foods with less than 300 mg of sodium per serving fit into this eating plan.  To find whole grains, look for the word "whole" as the first word in the ingredient list. Shopping  Buy products labeled as "low-sodium" or "no salt added."  Buy fresh foods. Avoid canned foods and premade or frozen meals. Cooking  Avoid adding salt when cooking. Use salt-free seasonings or herbs instead of table salt or sea salt. Check with your health care provider or pharmacist before using salt substitutes.  Do not fry foods. Cook foods using healthy methods such as baking, boiling, grilling, and broiling instead.  Cook with  heart-healthy oils, such as olive, canola, soybean, or sunflower oil. Meal planning   Eat a balanced diet that includes: ? 5 or more servings of fruits and vegetables each day. At each meal, try to fill half of your plate with fruits and vegetables. ? Up to 6-8 servings of whole grains each day. ? Less than 6 oz of lean meat, poultry, or fish each day. A 3-oz serving of meat is about the same size as a deck of cards. One egg equals 1 oz. ? 2 servings of low-fat dairy each day. ? A serving of nuts, seeds, or beans 5 times each week. ? Heart-healthy fats. Healthy fats called Omega-3 fatty acids are found in foods such as flaxseeds and coldwater fish, like sardines, salmon, and mackerel.  Limit how much you eat of the following: ? Canned or prepackaged foods. ? Food that is high in trans fat, such as fried foods. ? Food that is high in saturated fat, such as fatty meat. ? Sweets, desserts, sugary drinks, and other foods with added sugar. ? Full-fat dairy products.  Do not salt foods before eating.  Try to eat at least 2 vegetarian meals each week.  Eat more home-cooked food and less restaurant, buffet, and fast food.  When eating at a restaurant, ask that your food be prepared with less salt or no salt, if possible. What foods are recommended? The items listed may not be a complete list. Talk with your dietitian about what   dietary choices are best for you. Grains Whole-grain or whole-wheat bread. Whole-grain or whole-wheat pasta. Brown rice. Oatmeal. Quinoa. Bulgur. Whole-grain and low-sodium cereals. Pita bread. Low-fat, low-sodium crackers. Whole-wheat flour tortillas. Vegetables Fresh or frozen vegetables (raw, steamed, roasted, or grilled). Low-sodium or reduced-sodium tomato and vegetable juice. Low-sodium or reduced-sodium tomato sauce and tomato paste. Low-sodium or reduced-sodium canned vegetables. Fruits All fresh, dried, or frozen fruit. Canned fruit in natural juice (without  added sugar). Meat and other protein foods Skinless chicken or turkey. Ground chicken or turkey. Pork with fat trimmed off. Fish and seafood. Egg whites. Dried beans, peas, or lentils. Unsalted nuts, nut butters, and seeds. Unsalted canned beans. Lean cuts of beef with fat trimmed off. Low-sodium, lean deli meat. Dairy Low-fat (1%) or fat-free (skim) milk. Fat-free, low-fat, or reduced-fat cheeses. Nonfat, low-sodium ricotta or cottage cheese. Low-fat or nonfat yogurt. Low-fat, low-sodium cheese. Fats and oils Soft margarine without trans fats. Vegetable oil. Low-fat, reduced-fat, or light mayonnaise and salad dressings (reduced-sodium). Canola, safflower, olive, soybean, and sunflower oils. Avocado. Seasoning and other foods Herbs. Spices. Seasoning mixes without salt. Unsalted popcorn and pretzels. Fat-free sweets. What foods are not recommended? The items listed may not be a complete list. Talk with your dietitian about what dietary choices are best for you. Grains Baked goods made with fat, such as croissants, muffins, or some breads. Dry pasta or rice meal packs. Vegetables Creamed or fried vegetables. Vegetables in a cheese sauce. Regular canned vegetables (not low-sodium or reduced-sodium). Regular canned tomato sauce and paste (not low-sodium or reduced-sodium). Regular tomato and vegetable juice (not low-sodium or reduced-sodium). Pickles. Olives. Fruits Canned fruit in a light or heavy syrup. Fried fruit. Fruit in cream or butter sauce. Meat and other protein foods Fatty cuts of meat. Ribs. Fried meat. Bacon. Sausage. Bologna and other processed lunch meats. Salami. Fatback. Hotdogs. Bratwurst. Salted nuts and seeds. Canned beans with added salt. Canned or smoked fish. Whole eggs or egg yolks. Chicken or turkey with skin. Dairy Whole or 2% milk, cream, and half-and-half. Whole or full-fat cream cheese. Whole-fat or sweetened yogurt. Full-fat cheese. Nondairy creamers. Whipped toppings.  Processed cheese and cheese spreads. Fats and oils Butter. Stick margarine. Lard. Shortening. Ghee. Bacon fat. Tropical oils, such as coconut, palm kernel, or palm oil. Seasoning and other foods Salted popcorn and pretzels. Onion salt, garlic salt, seasoned salt, table salt, and sea salt. Worcestershire sauce. Tartar sauce. Barbecue sauce. Teriyaki sauce. Soy sauce, including reduced-sodium. Steak sauce. Canned and packaged gravies. Fish sauce. Oyster sauce. Cocktail sauce. Horseradish that you find on the shelf. Ketchup. Mustard. Meat flavorings and tenderizers. Bouillon cubes. Hot sauce and Tabasco sauce. Premade or packaged marinades. Premade or packaged taco seasonings. Relishes. Regular salad dressings. Where to find more information:  National Heart, Lung, and Blood Institute: www.nhlbi.nih.gov  American Heart Association: www.heart.org Summary  The DASH eating plan is a healthy eating plan that has been shown to reduce high blood pressure (hypertension). It may also reduce your risk for type 2 diabetes, heart disease, and stroke.  With the DASH eating plan, you should limit salt (sodium) intake to 2,300 mg a day. If you have hypertension, you may need to reduce your sodium intake to 1,500 mg a day.  When on the DASH eating plan, aim to eat more fresh fruits and vegetables, whole grains, lean proteins, low-fat dairy, and heart-healthy fats.  Work with your health care provider or diet and nutrition specialist (dietitian) to adjust your eating plan to your individual   calorie needs. This information is not intended to replace advice given to you by your health care provider. Make sure you discuss any questions you have with your health care provider. Document Released: 11/26/2011 Document Revised: 11/30/2016 Document Reviewed: 11/30/2016 Elsevier Interactive Patient Education  2017 Elsevier Inc.  

## 2017-08-24 NOTE — Progress Notes (Signed)
BP (!) 165/79 (BP Location: Left Arm, Cuff Size: Normal)   Pulse 80   Temp 98.6 F (37 C)   Wt 174 lb 9.6 oz (79.2 kg)   LMP  (LMP Unknown)   SpO2 98%   BMI 33.76 kg/m    Subjective:    Patient ID: Lisa Bass, female    DOB: 10/23/1951, 66 y.o.   MRN: [id number]  HPI: Lisa Bass is a 66 y.o. female  Chief Complaint  Patient presents with  . Depression    4 week f/up  . Hypertension    4 week f/up   Depression Pt still feels she is doing well on current medications.  States she doesn't feel manic.  She is able to drive around town but has never been able to drive on the interstate.  She is back to doing hobbies again.   Depression screen [facility] 2/9 08/24/2017 07/27/2017 07/20/2017 06/30/2017 06/16/2017  Decreased Interest 1 1 1 1 2   Down, Depressed, Hopeless 1 1 2 1 1   PHQ - 2 Score 2 2 3 2 3   Altered sleeping 1 1 1  0 1  Tired, decreased energy 1 0 0 0 1  Change in appetite 1 1 1  0 2  Feeling bad or failure about yourself  1 1 1  0 1  Trouble concentrating 1 1 2  0 3  Moving slowly or fidgety/restless 1 1 3  0 1  Suicidal thoughts 0 0 0 0 0  PHQ-9 Score 8 7 11 2 12    Hypertension Using medications without difficulty Average home BPs Not checking at home   No problems or lightheadedness No chest pain with exertion or shortness of breath No Edema  Relevant past medical, surgical, family and social history reviewed and updated as indicated. Interim medical history since our last visit reviewed. Allergies and medications reviewed and updated.  Review of Systems  Per HPI unless specifically indicated above     Objective:    BP (!) 165/79 (BP Location: Left Arm, Cuff Size: Normal)   Pulse 80   Temp 98.6 F (37 C)   Wt 174 lb 9.6 oz (79.2 kg)   LMP  (LMP Unknown)   SpO2 98%   BMI 33.76 kg/m   Wt Readings from Last 3 Encounters:  08/24/17 174 lb 9.6 oz (79.2 kg)  07/27/17 173 lb 3.2 oz (78.6 kg)  07/20/17 171 lb (77.6 kg)    Physical Exam  Constitutional:  She is oriented to person, place, and time. She appears well-developed and well-nourished. No distress.  HENT:  Head: Normocephalic and atraumatic.  Eyes: Conjunctivae and lids are normal. Right eye exhibits no discharge. Left eye exhibits no discharge. No scleral icterus.  Neck: Normal range of motion. Neck supple. No JVD present. Carotid bruit is not present.  Cardiovascular: Normal rate, regular rhythm and normal heart sounds.   Pulmonary/Chest: Effort normal and breath sounds normal.  Abdominal: Normal appearance. There is no splenomegaly or hepatomegaly.  Musculoskeletal: Normal range of motion.  Neurological: She is alert and oriented to person, place, and time.  Skin: Skin is warm, dry and intact. No rash noted. No pallor.  Psychiatric: She has a normal mood and affect. Her behavior is normal. Judgment and thought content normal.    Results for orders placed or performed in visit on 08/02/17  Cologuard  Result Value Ref Range   Cologuard Positive       Assessment & Plan:   Problem List Items Addressed This Visit  Unprioritized   Hypertension    Not to goal.  Increase HCTZ to 25 mg from 12.5 mg.  Encouraged to get home numbers.  Dash diet      Major depression, chronic    Stable, continue present medications. Gave permission to increase Abilify to 4 mg if she feels the need           Follow up plan: Return in about 4 weeks (around 09/21/2017).

## 2017-08-24 NOTE — Assessment & Plan Note (Addendum)
Stable, continue present medications. Gave permission to increase Abilify to 4 mg if she feels the need

## 2017-09-07 ENCOUNTER — Ambulatory Visit
Admission: RE | Admit: 2017-09-07 | Discharge: 2017-09-07 | Disposition: A | Discharge status: Home / Self Care | Payer: Medicare Other | Source: Ambulatory Visit | Attending: Gastroenterology | Admitting: Gastroenterology

## 2017-09-07 ENCOUNTER — Ambulatory Visit: Payer: Medicare Other | Admitting: Anesthesiology

## 2017-09-07 ENCOUNTER — Encounter
Admission: RE | Disposition: A | Discharge status: Home / Self Care | Payer: Self-pay | Source: Ambulatory Visit | Attending: Gastroenterology

## 2017-09-07 ENCOUNTER — Encounter: Payer: Self-pay | Admitting: *Deleted

## 2017-09-07 DIAGNOSIS — E785 Hyperlipidemia, unspecified: Secondary | ICD-10-CM | POA: Insufficient documentation

## 2017-09-07 DIAGNOSIS — Z7982 Long term (current) use of aspirin: Secondary | ICD-10-CM | POA: Diagnosis not present

## 2017-09-07 DIAGNOSIS — Z1211 Encounter for screening for malignant neoplasm of colon: Secondary | ICD-10-CM | POA: Diagnosis not present

## 2017-09-07 DIAGNOSIS — R195 Other fecal abnormalities: Secondary | ICD-10-CM

## 2017-09-07 DIAGNOSIS — K648 Other hemorrhoids: Secondary | ICD-10-CM | POA: Diagnosis not present

## 2017-09-07 DIAGNOSIS — Z1212 Encounter for screening for malignant neoplasm of rectum: Secondary | ICD-10-CM

## 2017-09-07 DIAGNOSIS — I2510 Atherosclerotic heart disease of native coronary artery without angina pectoris: Secondary | ICD-10-CM | POA: Diagnosis not present

## 2017-09-07 DIAGNOSIS — Z79899 Other long term (current) drug therapy: Secondary | ICD-10-CM | POA: Diagnosis not present

## 2017-09-07 DIAGNOSIS — F329 Major depressive disorder, single episode, unspecified: Secondary | ICD-10-CM | POA: Diagnosis not present

## 2017-09-07 DIAGNOSIS — Z888 Allergy status to other drugs, medicaments and biological substances status: Secondary | ICD-10-CM | POA: Insufficient documentation

## 2017-09-07 DIAGNOSIS — J45909 Unspecified asthma, uncomplicated: Secondary | ICD-10-CM | POA: Insufficient documentation

## 2017-09-07 DIAGNOSIS — I509 Heart failure, unspecified: Secondary | ICD-10-CM | POA: Diagnosis not present

## 2017-09-07 DIAGNOSIS — I110 Hypertensive heart disease with heart failure: Secondary | ICD-10-CM | POA: Diagnosis not present

## 2017-09-07 DIAGNOSIS — F429 Obsessive-compulsive disorder, unspecified: Secondary | ICD-10-CM | POA: Diagnosis not present

## 2017-09-07 DIAGNOSIS — Z880 Allergy status to penicillin: Secondary | ICD-10-CM | POA: Insufficient documentation

## 2017-09-07 DIAGNOSIS — K640 First degree hemorrhoids: Secondary | ICD-10-CM | POA: Diagnosis not present

## 2017-09-07 HISTORY — PX: COLONOSCOPY WITH PROPOFOL: SHX5780

## 2017-09-07 SURGERY — COLONOSCOPY WITH PROPOFOL
Anesthesia: General

## 2017-09-07 MED ORDER — MIDAZOLAM HCL 2 MG/2ML IJ SOLN
INTRAMUSCULAR | Status: AC
  Filled 2017-09-07: qty 2

## 2017-09-07 MED ORDER — SODIUM CHLORIDE 0.9 % IV SOLN
INTRAVENOUS | Status: DC
  Administered 2017-09-07: 09:00:00 via INTRAVENOUS

## 2017-09-07 MED ORDER — PROPOFOL 500 MG/50ML IV EMUL
INTRAVENOUS | Status: AC
  Filled 2017-09-07: qty 50

## 2017-09-07 MED ORDER — PROPOFOL 500 MG/50ML IV EMUL
INTRAVENOUS | Status: DC | PRN
  Administered 2017-09-07: 120 ug/kg/min via INTRAVENOUS

## 2017-09-07 MED ORDER — FENTANYL CITRATE (PF) 100 MCG/2ML IJ SOLN
INTRAMUSCULAR | Status: DC | PRN
  Administered 2017-09-07: 50 ug via INTRAVENOUS

## 2017-09-07 MED ORDER — FENTANYL CITRATE (PF) 100 MCG/2ML IJ SOLN
INTRAMUSCULAR | Status: AC
  Filled 2017-09-07: qty 2

## 2017-09-07 MED ORDER — MIDAZOLAM HCL 2 MG/2ML IJ SOLN
INTRAMUSCULAR | Status: DC | PRN
  Administered 2017-09-07: 2 mg via INTRAVENOUS

## 2017-09-07 NOTE — Transfer of Care (Signed)
Immediate Anesthesia Transfer of Care Note  Patient: Lisa Bass  Procedure(s) Performed: Procedure(s): COLONOSCOPY WITH PROPOFOL (N/A)  Patient Location: PACU  Anesthesia Type:General  Level of Consciousness: sedated  Airway & Oxygen Therapy: Patient Spontanous Breathing and Patient connected to nasal cannula oxygen  Post-op Assessment: Report given to RN and Post -op Vital signs reviewed and stable  Post vital signs: Reviewed and stable  Last Vitals:  Vitals:   09/07/17 0911 09/07/17 1027  BP: 131/74   Pulse: 75 77  Resp: 18 17  Temp: (!) 36.2 C (!) 36.3 C  SpO2: 100% 99%    Last Pain:  Vitals:   09/07/17 1027  TempSrc: Tympanic         Complications: No apparent anesthesia complications

## 2017-09-07 NOTE — H&P (Signed)
Lisa Lame, MD [facility] [address], [place], [place] [address] Phone:[phone] Fax : [phone]  Primary Care Physician:  [name], NP Primary Gastroenterologist:  Dr. Allen Norris  Pre-Procedure History & Physical: HPI:  Lisa Bass is a 66 y.o. female is here for an colonoscopy.   Past Medical History:  Diagnosis Date  . Asthma   . CHF (congestive heart failure) ([place])   . Depression   . Hyperlipidemia   . Hypertension   . OCD (obsessive compulsive disorder)   . Palpitations   . Perimenopause     Past Surgical History:  Procedure Laterality Date  . CHOLECYSTECTOMY    . open heart surgery    . ROOT CANAL     x2    Prior to Admission medications   Medication Sig Start Date End Date Taking? Authorizing Provider  ARIPiprazole (ABILIFY) 2 MG tablet TAKE ONE TABLET EVERY DAY 08/20/17  Yes [name], NP  aspirin 81 MG tablet Take 81 mg by mouth daily.   Yes [provider]  hydrochlorothiazide (HYDRODIURIL) 25 MG tablet TAKE ONE TABLET BY MOUTH EVERY DAY Patient taking differently: TAKE ONE HALF TABLET BY MOUTH EVERY DAY 04/05/17  Yes [name], NP  metoprolol succinate (TOPROL-XL) 50 MG 24 hr tablet Take 1 tablet (50 mg total) by mouth daily. Take with or immediately following a meal. 07/27/17  Yes [name], NP  PARoxetine (PAXIL) 40 MG tablet Take 20 mg by mouth daily. Take 1/2 dose 06/10/17  Yes [provider]    Allergies as of 08/12/2017 - Review Complete 07/27/2017  Allergen Reaction Noted  . Penicillins Anaphylaxis 08/09/2015  . Biaxin [clarithromycin] Other (See Comments) 08/08/2015  . Lisinopril Diarrhea 08/08/2015    Family History  Problem Relation Age of Onset  . Alcohol abuse Mother   . Hypertension Mother   . Dementia Father   . Hypertension Father   . Diabetes Father   . Diabetes Sister   . GI problems Daughter   . Heart disease Maternal Grandfather     Social History   Social History  . Marital  status: Married    Spouse name: N/A  . Number of children: N/A  . Years of education: N/A   Occupational History  . Not on file.   Social History Main Topics  . Smoking status: Never Smoker  . Smokeless tobacco: Never Used  . Alcohol use No  . Drug use: No  . Sexual activity: Yes   Other Topics Concern  . Not on file   Social History Narrative  . No narrative on file    Review of Systems: See HPI, otherwise negative ROS  Physical Exam: BP 131/74   Pulse 75   Temp (!) 97.2 F (36.2 C) (Tympanic)   Resp 18   Ht 5' (1.524 m)   Wt 174 lb (78.9 kg)   LMP  (LMP Unknown)   SpO2 100%   BMI 33.98 kg/m  General:   Alert,  pleasant and cooperative in NAD Head:  Normocephalic and atraumatic. Neck:  Supple; no masses or thyromegaly. Lungs:  Clear throughout to auscultation.    Heart:  Regular rate and rhythm. Abdomen:  Soft, nontender and nondistended. Normal bowel sounds, without guarding, and without rebound.   Neurologic:  Alert and  oriented x4;  grossly normal neurologically.  Impression/Plan: [name] is here for an colonoscopy to be performed for positive cologuard  Risks, benefits, limitations, and alternatives regarding  colonoscopy have been reviewed  with the patient.  Questions have been answered.  All parties agreeable.   Lisa Lame, MD  09/07/2017, 9:24 AM

## 2017-09-07 NOTE — Anesthesia Procedure Notes (Signed)
Performed by: COOK-MARTIN, Zyrell Carmean Pre-anesthesia Checklist: Patient identified, Emergency Drugs available, Suction available, Patient being monitored and Timeout performed Patient Re-evaluated:Patient Re-evaluated prior to induction Oxygen Delivery Method: Nasal cannula Preoxygenation: Pre-oxygenation with 100% oxygen Induction Type: IV induction Placement Confirmation: positive ETCO2 and CO2 detector       

## 2017-09-07 NOTE — Anesthesia Postprocedure Evaluation (Signed)
Anesthesia Post Note  Patient: Lisa Bass  Procedure(s) Performed: Procedure(s) (LRB): COLONOSCOPY WITH PROPOFOL (N/A)  Patient location during evaluation: PACU Anesthesia Type: General Level of consciousness: awake and alert and oriented Pain management: pain level controlled Vital Signs Assessment: post-procedure vital signs reviewed and stable Respiratory status: spontaneous breathing Cardiovascular status: blood pressure returned to baseline Anesthetic complications: no     Last Vitals:  Vitals:   09/07/17 1047 09/07/17 1057  BP: 115/75 116/70  Pulse: 81 82  Resp: 16 14  Temp:    SpO2: 100% 100%    Last Pain:  Vitals:   09/07/17 1027  TempSrc: Tympanic                 [name]

## 2017-09-07 NOTE — Op Note (Signed)
[facility] Gastroenterology Patient Name: Lisa Bass Procedure Date: 09/07/2017 9:56 AM MRN: [id number] Account #: [id number] Date of Birth: [birth year] Admit Type: Outpatient Age: 66 Room: [facility] ENDO ROOM 4 Gender: Female Note Status: Finalized Procedure:            Colonoscopy Indications:          Positive Cologuard test Providers:            [name] MD, MD Referring MD:         [name] (Referring MD) Medicines:            Propofol per Anesthesia Complications:        No immediate complications. Procedure:            Pre-Anesthesia Assessment:                       - Prior to the procedure, a History and Physical was                        performed, and patient medications and allergies were                        reviewed. The patient's tolerance of previous                        anesthesia was also reviewed. The risks and benefits of                        the procedure and the sedation options and risks were                        discussed with the patient. All questions were                        answered, and informed consent was obtained. Prior                        Anticoagulants: The patient has taken no previous                        anticoagulant or antiplatelet agents. ASA Grade                        Assessment: II - A patient with mild systemic disease.                        After reviewing the risks and benefits, the patient was                        deemed in satisfactory condition to undergo the                        procedure.                       After obtaining informed consent, the colonoscope was                        passed under direct vision. Throughout the procedure,  the patient's blood pressure, pulse, and oxygen                        saturations were monitored continuously. The                        Colonoscope was introduced through the anus and                        advanced to the  the cecum, identified by appendiceal                        orifice and ileocecal valve. The colonoscopy was                        performed without difficulty. The patient tolerated the                        procedure well. The quality of the bowel preparation                        was excellent. Findings:      The perianal and digital rectal examinations were normal.      Non-bleeding internal hemorrhoids were found during retroflexion. The       hemorrhoids were Grade I (internal hemorrhoids that do not prolapse). Impression:           - Non-bleeding internal hemorrhoids.                       - No specimens collected. Recommendation:       - Discharge patient to home.                       - Resume previous diet.                       - Continue present medications.                       - Repeat colonoscopy in 10 years for screening unless                        any change in family history or lower GI problems. Procedure Code(s):    --- Professional ---                       [phone], Colonoscopy, flexible; diagnostic, including                        collection of specimen(s) by brushing or washing, when                        performed (separate procedure) Diagnosis Code(s):    --- Professional ---                       R19.5, Other fecal abnormalities CPT copyright 2016 American Medical Association. All rights reserved. The codes documented in this report are preliminary and upon coder review may  be revised to meet current compliance requirements. [name] MD, MD 09/07/2017 10:24:00 AM This report has been signed electronically. Number of Addenda: 0 Note Initiated On:  09/07/2017 9:56 AM Scope Withdrawal Time: 0 hours 6 minutes 26 seconds  Total Procedure Duration: 0 hours 11 minutes 21 seconds       [facility]

## 2017-09-07 NOTE — Anesthesia Post-op Follow-up Note (Signed)
Anesthesia QCDR form completed.        

## 2017-09-07 NOTE — Anesthesia Preprocedure Evaluation (Addendum)
Anesthesia Evaluation  Patient identified by MRN, date of birth, ID band Patient awake    Reviewed: Allergy & Precautions, NPO status , Patient's Chart, lab work & pertinent test results, reviewed documented beta blocker date and time   Airway Mallampati: IV  TM Distance: <3 FB     Dental   Pulmonary asthma ,    Pulmonary exam normal        Cardiovascular hypertension, Pt. on medications and Pt. on home beta blockers + CAD and +CHF  Normal cardiovascular exam     Neuro/Psych PSYCHIATRIC DISORDERS Depression negative neurological ROS     GI/Hepatic Neg liver ROS,   Endo/Other  negative endocrine ROS  Renal/GU negative Renal ROS  negative genitourinary   Musculoskeletal negative musculoskeletal ROS (+)   Abdominal Normal abdominal exam  (+)   Peds negative pediatric ROS (+)  Hematology negative hematology ROS (+)   Anesthesia Other Findings Past Medical History: No date: Asthma No date: CHF (congestive heart failure) ([facility]) No date: Depression No date: Hyperlipidemia No date: Hypertension No date: OCD (obsessive compulsive disorder) No date: Palpitations No date: Perimenopause  Reproductive/Obstetrics                            Anesthesia Physical Anesthesia Plan  ASA: III  Anesthesia Plan: General   Post-op Pain Management:    Induction: Intravenous  PONV Risk Score and Plan:   Airway Management Planned: Nasal Cannula  Additional Equipment:   Intra-op Plan:   Post-operative Plan:   Informed Consent: I have reviewed the patients History and Physical, chart, labs and discussed the procedure including the risks, benefits and alternatives for the proposed anesthesia with the patient or authorized representative who has indicated his/her understanding and acceptance.   Dental advisory given  Plan Discussed with: CRNA and Surgeon  Anesthesia Plan Comments:          Anesthesia Quick Evaluation

## 2017-09-08 ENCOUNTER — Encounter: Payer: Self-pay | Admitting: Gastroenterology

## 2017-09-15 ENCOUNTER — Encounter: Payer: Self-pay | Admitting: Family Medicine

## 2017-09-15 ENCOUNTER — Telehealth: Payer: Self-pay | Admitting: Unknown Physician Specialty

## 2017-09-15 ENCOUNTER — Ambulatory Visit (INDEPENDENT_AMBULATORY_CARE_PROVIDER_SITE_OTHER): Payer: Medicare Other | Admitting: Family Medicine

## 2017-09-15 VITALS — BP 122/84 | HR 60 | Resp 16 | Ht 60.0 in | Wt 171.6 lb

## 2017-09-15 DIAGNOSIS — I10 Essential (primary) hypertension: Secondary | ICD-10-CM

## 2017-09-15 DIAGNOSIS — I2510 Atherosclerotic heart disease of native coronary artery without angina pectoris: Secondary | ICD-10-CM | POA: Diagnosis not present

## 2017-09-15 DIAGNOSIS — R251 Tremor, unspecified: Secondary | ICD-10-CM

## 2017-09-15 DIAGNOSIS — R739 Hyperglycemia, unspecified: Secondary | ICD-10-CM

## 2017-09-15 DIAGNOSIS — D170 Benign lipomatous neoplasm of skin and subcutaneous tissue of head, face and neck: Secondary | ICD-10-CM

## 2017-09-15 DIAGNOSIS — F429 Obsessive-compulsive disorder, unspecified: Secondary | ICD-10-CM

## 2017-09-15 DIAGNOSIS — G3184 Mild cognitive impairment, so stated: Secondary | ICD-10-CM | POA: Diagnosis not present

## 2017-09-15 DIAGNOSIS — F329 Major depressive disorder, single episode, unspecified: Secondary | ICD-10-CM

## 2017-09-15 DIAGNOSIS — F341 Dysthymic disorder: Secondary | ICD-10-CM | POA: Diagnosis not present

## 2017-09-15 MED ORDER — LOSARTAN POTASSIUM 50 MG PO TABS: 50.0000 mg | ORAL_TABLET | Freq: Every day | ORAL | 2 refills | Status: DC

## 2017-09-15 NOTE — Telephone Encounter (Signed)
Routing to provider, FYI.  

## 2017-09-15 NOTE — Telephone Encounter (Signed)
Received a call from Dr Chinita Greenland office stating patient was there for a new patient visit but they are unsure of exactly what the issue is so Dr Vicente Masson has agreed to do a consult visit with the patient.  Just FYI for [place]. They wanted her to be in the loop.  Thanks

## 2017-09-17 ENCOUNTER — Telehealth: Payer: Self-pay

## 2017-09-17 LAB — CBC
Hematocrit: 39.9 % (ref 34.0–46.6)
Hemoglobin: 12.7 g/dL (ref 11.1–15.9)
MCH: 26.8 pg (ref 26.6–33.0)
MCHC: 31.8 g/dL (ref 31.5–35.7)
MCV: 84 fL (ref 79–97)
PLATELETS: 245 10*3/uL (ref 150–379)
RBC: 4.74 x10E6/uL (ref 3.77–5.28)
RDW: 14.5 % (ref 12.3–15.4)
WBC: 10.2 10*3/uL (ref 3.4–10.8)

## 2017-09-17 LAB — COMPREHENSIVE METABOLIC PANEL
ALT: 17 IU/L (ref 0–32)
AST: 21 IU/L (ref 0–40)
Albumin/Globulin Ratio: 1.3 (ref 1.2–2.2)
Albumin: 4 g/dL (ref 3.6–4.8)
Alkaline Phosphatase: 100 IU/L (ref 39–117)
BUN/Creatinine Ratio: 7 — ABNORMAL LOW (ref 12–28)
BUN: 7 mg/dL — ABNORMAL LOW (ref 8–27)
Bilirubin Total: 0.4 mg/dL (ref 0.0–1.2)
CO2: 26 mmol/L (ref 20–29)
Calcium: 9.4 mg/dL (ref 8.7–10.3)
Chloride: 103 mmol/L (ref 96–106)
Creatinine, Ser: 1 mg/dL (ref 0.57–1.00)
GFR calc Af Amer: 67 mL/min/{1.73_m2} (ref >59)
GFR calc non Af Amer: 58 mL/min/{1.73_m2} — ABNORMAL LOW (ref >59)
Globulin, Total: 3.2 g/dL (ref 1.5–4.5)
Glucose: 97 mg/dL (ref 65–99)
Potassium: 3.9 mmol/L (ref 3.5–5.2)
Sodium: 143 mmol/L (ref 134–144)
Total Protein: 7.2 g/dL (ref 6.0–8.5)

## 2017-09-17 LAB — HEMOGLOBIN A1C
Est. average glucose Bld gHb Est-mCnc: 123 mg/dL
Hgb A1c MFr Bld: 5.9 % — ABNORMAL HIGH (ref 4.8–5.6)

## 2017-09-17 LAB — RPR: RPR Ser Ql: NONREACTIVE

## 2017-09-17 LAB — VITAMIN B12: Vitamin B-12: 218 pg/mL — ABNORMAL LOW (ref 232–1245)

## 2017-09-17 LAB — TSH: TSH: 2.99 u[IU]/mL (ref 0.450–4.500)

## 2017-09-17 NOTE — Telephone Encounter (Signed)
Patient wants sedative to stay still in MRI. Hospital advised she call us for this. MRI 10/4/20108

## 2017-09-20 ENCOUNTER — Other Ambulatory Visit: Payer: Self-pay | Admitting: Family Medicine

## 2017-09-20 MED ORDER — DIAZEPAM 5 MG PO TABS: 5.0000 mg | ORAL_TABLET | Freq: Once | ORAL | 0 refills | Status: AC

## 2017-09-20 NOTE — Telephone Encounter (Signed)
Done

## 2017-09-21 ENCOUNTER — Ambulatory Visit (INDEPENDENT_AMBULATORY_CARE_PROVIDER_SITE_OTHER): Payer: Medicare Other | Admitting: Unknown Physician Specialty

## 2017-09-21 ENCOUNTER — Encounter: Payer: Self-pay | Admitting: Family Medicine

## 2017-09-21 ENCOUNTER — Other Ambulatory Visit: Payer: Self-pay | Admitting: Family Medicine

## 2017-09-21 ENCOUNTER — Encounter: Payer: Self-pay | Admitting: Unknown Physician Specialty

## 2017-09-21 DIAGNOSIS — F341 Dysthymic disorder: Secondary | ICD-10-CM | POA: Diagnosis not present

## 2017-09-21 DIAGNOSIS — E538 Deficiency of other specified B group vitamins: Secondary | ICD-10-CM

## 2017-09-21 DIAGNOSIS — R7303 Prediabetes: Secondary | ICD-10-CM

## 2017-09-21 DIAGNOSIS — R7309 Other abnormal glucose: Secondary | ICD-10-CM | POA: Insufficient documentation

## 2017-09-21 DIAGNOSIS — R251 Tremor, unspecified: Secondary | ICD-10-CM | POA: Diagnosis not present

## 2017-09-21 DIAGNOSIS — D170 Benign lipomatous neoplasm of skin and subcutaneous tissue of head, face and neck: Secondary | ICD-10-CM | POA: Insufficient documentation

## 2017-09-21 DIAGNOSIS — F329 Major depressive disorder, single episode, unspecified: Secondary | ICD-10-CM

## 2017-09-21 MED ORDER — CYANOCOBALAMIN 1000 MCG/ML IJ SOLN
1000.0000 ug | Freq: Once | INTRAMUSCULAR | Status: AC
  Administered 2017-09-21: 1000 ug via INTRAMUSCULAR

## 2017-09-21 MED ORDER — ARIPIPRAZOLE 5 MG PO TABS: 5.0000 mg | ORAL_TABLET | Freq: Every day | ORAL | 1 refills | Status: DC

## 2017-09-21 MED ORDER — VITAMIN B-12 1000 MCG PO TABS: 1000.0000 ug | ORAL_TABLET | Freq: Every day | ORAL | Status: DC

## 2017-09-21 NOTE — Assessment & Plan Note (Addendum)
Not to goal with many bad days.  Increase Abilify to 5 mg.

## 2017-09-21 NOTE — Assessment & Plan Note (Signed)
Will give B12 shot here in the office.  Then take OTC B12

## 2017-09-21 NOTE — Progress Notes (Signed)
BP 138/84 (BP Location: Left Arm, Cuff Size: Large)   Pulse 73   Temp 98.4 F (36.9 C)   Wt 169 lb 9.6 oz (76.9 kg)   LMP  (LMP Unknown)   SpO2 98%   BMI 33.12 kg/m    Subjective:    Patient ID: Lisa Bass, female    DOB: November 18, 1951, 66 y.o.   MRN: [id number]  HPI: Lisa Bass is a 66 y.o. female  Chief Complaint  Patient presents with  . Depression    4 week f/up  . Hypertension    4 week f/up    Pt went to see Dr. Vicente Masson as a consult particularly related to memory issues.  W/u is pending but Dr. Vicente Masson feels most likely related to depression.  Review of labs are remarkable for a B12 deficiency.     Hypertension Second reading SBP 136 Using medications without difficulty Average home BPs Not checking   No problems or lightheadedness No chest pain with exertion or shortness of breath No Edema  Depression Husband and friend say demeanor has changed and not "chirpy."   States she has good days and bad days.  Most are good.   Depression screen [facility] 2/9 09/21/2017 09/15/2017 08/24/2017 07/27/2017 07/20/2017  Decreased Interest 1 1 1 1 1   Down, Depressed, Hopeless 1 1 1 1 2   PHQ - 2 Score 2 2 2 2 3   Altered sleeping 1 0 1 1 1   Tired, decreased energy 1 0 1 0 0  Change in appetite 1 0 1 1 1   Feeling bad or failure about yourself  1 1 1 1 1   Trouble concentrating 1 3 1 1 2   Moving slowly or fidgety/restless 2 0 1 1 3   Suicidal thoughts 0 0 0 0 0  PHQ-9 Score 9 6 8 7 11   Difficult doing work/chores - Somewhat difficult - - -  Some recent data might be hidden   Pt with history of myxoma.  Read note from Dr. Kizzie Furnish who states well known history of CAD but not evident on review of chart.      Relevant past medical, surgical, family and social history reviewed and updated as indicated. Interim medical history since our last visit reviewed. Allergies and medications reviewed and updated.  Review of Systems  Per HPI unless specifically indicated above     Objective:    BP 138/84 (BP Location: Left Arm, Cuff Size: Large)   Pulse 73   Temp 98.4 F (36.9 C)   Wt 169 lb 9.6 oz (76.9 kg)   LMP  (LMP Unknown)   SpO2 98%   BMI 33.12 kg/m   Wt Readings from Last 3 Encounters:  09/21/17 169 lb 9.6 oz (76.9 kg)  09/15/17 171 lb 9.6 oz (77.8 kg)  09/07/17 174 lb (78.9 kg)    Physical Exam  Constitutional: She is oriented to person, place, and time. She appears well-developed and well-nourished. No distress.  HENT:  Head: Normocephalic and atraumatic.  Eyes: Conjunctivae and lids are normal. Right eye exhibits no discharge. Left eye exhibits no discharge. No scleral icterus.  Neck: Normal range of motion. Neck supple. No JVD present. Carotid bruit is not present.  Cardiovascular: Normal rate, regular rhythm and normal heart sounds.   Pulmonary/Chest: Effort normal and breath sounds normal.  Abdominal: Normal appearance. There is no splenomegaly or hepatomegaly.  Musculoskeletal: Normal range of motion.  Neurological: She is alert and oriented to person, place, and time.  Skin: Skin  is warm, dry and intact. No rash noted. No pallor.  Psychiatric: She has a normal mood and affect. Her behavior is normal. Judgment and thought content normal.       Assessment & Plan:   Problem List Items Addressed This Visit      Unprioritized   B12 deficiency    Will give B12 shot here in the office.  Then take OTC B12      Relevant Medications   cyanocobalamin ((VITAMIN B-12)) injection 1,000 mcg (Completed)   Major depression, chronic    Not to goal with many bad days.  Increase Abilify to 5 mg.        Tremor    Await dementia work-up with Dr. Vicente Masson.  Will consider referral to neurology         CAD No evidence of this on chart review.  Problem resolved  Follow up plan: Return in about 4 weeks (around 10/19/2017).

## 2017-09-21 NOTE — Progress Notes (Signed)
Date:  09/15/2017   Name:  Lisa Bass   DOB:  1951-12-08   MRN:  [id number]  PCP:  [name], MD    Chief Complaint: Advice Only (Concerns of possible Alheimers or dementia. Also having depression that [name] is treating. )   History of Present Illness:  This is a 66 y.o. female seen for initial consult. C/o gradual ST memory loss past 6 months. Depression/OCD on Paxil for years, intolerant higher doses, Abilify added one month ago, not helping. HTN on losartan for years, HCTZ since April, metoprolol x 6 wks. CAD s/p CABG 10 yrs ago. Resting tremors B hands for years, mass R posterior neck, Father died DM 3s, mother died cirrhosis 4s, 3 sibs ok. Last tet imm 2000, UTD on pneumo imms, declines flu/ Tdap/zoster imms today. No recent mammo, colonoscopy 08/2017 ok.  Review of Systems:  Review of Systems  Constitutional: Negative for chills and fever.  HENT: Negative for ear pain, sinus pain and trouble swallowing.   Eyes: Negative for pain.  Respiratory: Negative for cough and shortness of breath.   Cardiovascular: Negative for chest pain and leg swelling.  Gastrointestinal: Negative for abdominal pain.  Endocrine: Negative for polydipsia and polyuria.  Genitourinary: Negative for difficulty urinating.  Musculoskeletal: Negative for joint swelling.  Neurological: Negative for syncope and light-headedness.  Hematological: Negative for adenopathy.    Patient Active Problem List   Diagnosis Date Noted  . Mild cognitive impairment 09/15/2017  . Positive colorectal cancer screening using Cologuard test 08/02/2017  . Tremor 07/27/2017  . CAD (coronary artery disease) 09/23/2015  . Myxoma of heart 09/23/2015  . Benign neoplasm of heart 09/23/2015  . Major depression, chronic 08/09/2015  . Perimenopause 08/08/2015  . OCD (obsessive compulsive disorder) 08/08/2015  . Hypertension 08/08/2015  . Hyperlipidemia 08/08/2015    Prior to Admission medications   Medication Sig  Start Date End Date Taking? Authorizing Provider  ARIPiprazole (ABILIFY) 2 MG tablet TAKE ONE TABLET EVERY DAY 08/20/17  Yes [name], NP  aspirin 81 MG tablet Take 81 mg by mouth daily.   Yes [provider]  hydrochlorothiazide (HYDRODIURIL) 25 MG tablet TAKE ONE TABLET BY MOUTH EVERY DAY Patient taking differently: TAKE ONE HALF TABLET BY MOUTH EVERY DAY 04/05/17  Yes [name], NP  PARoxetine (PAXIL) 40 MG tablet Take 20 mg by mouth daily. Take 1/2 dose 06/10/17  Yes [provider]  losartan (COZAAR) 50 MG tablet Take 1 tablet (50 mg total) by mouth daily. 09/15/17   [name], MD    Allergies  Allergen Reactions  . Penicillins Anaphylaxis  . Biaxin [Clarithromycin] Other (See Comments)    GI issues  . Lisinopril Diarrhea    Past Surgical History:  Procedure Laterality Date  . CHOLECYSTECTOMY    . COLONOSCOPY WITH PROPOFOL N/A 09/07/2017   Procedure: COLONOSCOPY WITH PROPOFOL;  Surgeon: [name], MD;  [facility] END[facility]COPY;  Service: Endoscopy;  Laterality: N/A;  . open heart surgery    . ROOT CANAL     x2    Social History  Substance Use Topics  . Smoking status: Never Smoker  . Smokeless tobacco: Never Used  . Alcohol use No    Family History  Problem Relation Age of Onset  . Alcohol abuse Mother   . Hypertension Mother   . Dementia Father   . Hypertension Father   . Diabetes Father   . Diabetes Sister   . GI problems Daughter   . Heart disease  Maternal Grandfather     Medication list has been reviewed and updated.  Physical Examination: BP 122/84   Pulse 60   Resp 16   Ht 5' (1.524 m)   Wt 171 lb 9.6 oz (77.8 kg)   LMP  (LMP Unknown)   SpO2 98%   BMI 33.51 kg/m   Physical Exam  Constitutional: She is oriented to person, place, and time. She appears well-developed and well-nourished.  HENT:  Head: Normocephalic and atraumatic.  Right Ear: External ear normal.  Left Ear: External ear normal.  Nose: Nose normal.   Mouth/Throat: Oropharynx is clear and moist.  TMs clear  Eyes: Pupils are equal, round, and reactive to light. Conjunctivae and EOM are normal.  Neck: Neck supple. No thyromegaly present.  Lipoma R posterior neck  Cardiovascular: Normal rate, regular rhythm, normal heart sounds and intact distal pulses.   Pulmonary/Chest: Effort normal and breath sounds normal.  Abdominal: Soft. She exhibits no distension and no mass. There is no tenderness.  Musculoskeletal: She exhibits no edema.  Lymphadenopathy:    She has no cervical adenopathy.  Neurological: She is alert and oriented to person, place, and time.  SLUMS 23/30 (12th grade) Romberg negative, gait normal B resting and intention tremors both hands  Skin: Skin is warm and dry.  Psychiatric: She has a normal mood and affect. Her behavior is normal.  GDS 3/15  Nursing note and vitals reviewed.   Assessment and Plan:  1. Essential hypertension Well controlled, d/c metoprolol as may be contributing to depression - Comprehensive Metabolic Panel (CMET) - CBC  2. Coronary artery disease involving native coronary artery of native heart without angina pectoris Stable on asa, consider statin, last LDL 93  3. Obsessive-compulsive disorder, unspecified type Marginal control on Paxil  4. Major depression, chronic Marginal control on Paxil/Abilify  5. Tremor Unclear etiology, consider neuro referral  6. Mild cognitive impairment Likely related to depression - B12 - TSH - RPR - MR Brain Wo Contrast; Future  7. Hyperglycemia - HgB A1c  8. Lipoma of neck Reassured, monitor  9. HM Consider flu/Tdap/zoster/mammo next visit  Return in about 4 weeks (around 10/13/2017).   60 mins spent with patient over half in counseling  Satira Anis. [facility]  09/21/2017

## 2017-09-21 NOTE — Progress Notes (Signed)
B12 low, begin PO supplement

## 2017-09-21 NOTE — Assessment & Plan Note (Signed)
Await dementia work-up with Dr. Vicente Masson.  Will consider referral to neurology

## 2017-09-23 ENCOUNTER — Ambulatory Visit
Admission: RE | Admit: 2017-09-23 | Discharge: 2017-09-23 | Disposition: A | Discharge status: Home / Self Care | Payer: Medicare Other | Source: Ambulatory Visit | Attending: Family Medicine | Admitting: Family Medicine

## 2017-09-23 DIAGNOSIS — R9082 White matter disease, unspecified: Secondary | ICD-10-CM | POA: Insufficient documentation

## 2017-09-23 DIAGNOSIS — R413 Other amnesia: Secondary | ICD-10-CM | POA: Diagnosis not present

## 2017-09-23 DIAGNOSIS — G3184 Mild cognitive impairment, so stated: Secondary | ICD-10-CM | POA: Insufficient documentation

## 2017-09-29 ENCOUNTER — Other Ambulatory Visit: Payer: Self-pay | Admitting: Unknown Physician Specialty

## 2017-10-13 ENCOUNTER — Encounter: Payer: Self-pay | Admitting: *Deleted

## 2017-10-13 ENCOUNTER — Ambulatory Visit: Payer: Medicare Other | Admitting: Family Medicine

## 2017-10-13 ENCOUNTER — Other Ambulatory Visit: Payer: Self-pay | Admitting: Unknown Physician Specialty

## 2017-10-13 NOTE — Telephone Encounter (Signed)
Attempting to call patient regarding her refill on her Abilify. Unable to reach her by phone will send her a message in My Chart.

## 2017-10-14 NOTE — Telephone Encounter (Signed)
Patient called to advise that she needs a refill for Abilify 5mg .  Patient is out of the medication. Please send refill to [facility].

## 2017-10-18 ENCOUNTER — Ambulatory Visit: Payer: Medicare Other | Admitting: Family Medicine

## 2017-10-19 ENCOUNTER — Encounter: Payer: Self-pay | Admitting: Unknown Physician Specialty

## 2017-10-19 ENCOUNTER — Ambulatory Visit (INDEPENDENT_AMBULATORY_CARE_PROVIDER_SITE_OTHER): Payer: Medicare Other | Admitting: Unknown Physician Specialty

## 2017-10-19 DIAGNOSIS — F341 Dysthymic disorder: Secondary | ICD-10-CM

## 2017-10-19 DIAGNOSIS — F329 Major depressive disorder, single episode, unspecified: Secondary | ICD-10-CM

## 2017-10-19 DIAGNOSIS — I10 Essential (primary) hypertension: Secondary | ICD-10-CM

## 2017-10-19 MED ORDER — ARIPIPRAZOLE 2 MG PO TABS: 2.0000 mg | ORAL_TABLET | Freq: Every day | ORAL | 1 refills | Status: DC

## 2017-10-19 NOTE — Progress Notes (Signed)
BP (!) 164/75   Pulse 76   Temp 98.5 F (36.9 C)   Wt 174 lb (78.9 kg)   LMP  (LMP Unknown)   SpO2 99%   BMI 33.98 kg/m    Subjective:    Patient ID: Lisa Bass, female    DOB: 17-May-1951, 66 y.o.   MRN: [id number]  HPI: Lisa Bass is a 66 y.o. female  Chief Complaint  Patient presents with  . Depression    pt states she did not recieve the 5 mg abilify, sent to incorrect pharmacy by mistake    Depression Pt states she is having more good days than bad.  Taking just 2 mg of Abilify.  States she had a normal MRI and she thinks that.    Depression screen [facility] 2/9 10/19/2017 09/21/2017 09/15/2017 08/24/2017 07/27/2017  Decreased Interest 0 1 1 1 1   Down, Depressed, Hopeless 1 1 1 1 1   PHQ - 2 Score 1 2 2 2 2   Altered sleeping 1 1 0 1 1  Tired, decreased energy 0 1 0 1 0  Change in appetite 1 1 0 1 1  Feeling bad or failure about yourself  0 1 1 1 1   Trouble concentrating 0 1 3 1 1   Moving slowly or fidgety/restless 1 2 0 1 1  Suicidal thoughts 0 0 0 0 0  PHQ-9 Score 4 9 6 8 7   Difficult doing work/chores - - Somewhat difficult - -  Some recent data might be hidden   Hypertension Review of BP is variable.  Many times it is below 140/90, but states it goes high when she gets nervous, such as today, when she drove here.     Relevant past medical, surgical, family and social history reviewed and updated as indicated. Interim medical history since our last visit reviewed. Allergies and medications reviewed and updated.  Review of Systems  Per HPI unless specifically indicated above     Objective:    BP (!) 164/75   Pulse 76   Temp 98.5 F (36.9 C)   Wt 174 lb (78.9 kg)   LMP  (LMP Unknown)   SpO2 99%   BMI 33.98 kg/m   Wt Readings from Last 3 Encounters:  10/19/17 174 lb (78.9 kg)  09/21/17 169 lb 9.6 oz (76.9 kg)  09/15/17 171 lb 9.6 oz (77.8 kg)    Physical Exam  Constitutional: She is oriented to person, place, and time. She appears well-developed  and well-nourished. No distress.  HENT:  Head: Normocephalic and atraumatic.  Eyes: Conjunctivae and lids are normal. Right eye exhibits no discharge. Left eye exhibits no discharge. No scleral icterus.  Neck: Normal range of motion. Neck supple. No JVD present. Carotid bruit is not present.  Cardiovascular: Normal rate, regular rhythm and normal heart sounds.   Pulmonary/Chest: Effort normal and breath sounds normal.  Abdominal: Normal appearance. There is no splenomegaly or hepatomegaly.  Musculoskeletal: Normal range of motion.  Neurological: She is alert and oriented to person, place, and time.  Skin: Skin is warm, dry and intact. No rash noted. No pallor.  Psychiatric: She has a normal mood and affect. Her behavior is normal. Judgment and thought content normal.    Results for orders placed or performed in visit on 09/15/17  B12  Result Value Ref Range   Vitamin B-12 218 (L) 232 - 1,245 pg/mL  TSH  Result Value Ref Range   TSH 2.990 0.450 - 4.500 uIU/mL  RPR  Result Value Ref Range   RPR Ser Ql Non Reactive Non Reactive  HgB A1c  Result Value Ref Range   Hgb A1c MFr Bld 5.9 (H) 4.8 - 5.6 %   Est. average glucose Bld gHb Est-mCnc 123 mg/dL  Comprehensive Metabolic Panel (CMET)  Result Value Ref Range   Glucose 97 65 - 99 mg/dL   BUN 7 (L) 8 - 27 mg/dL   Creatinine, Ser 1.00 0.57 - 1.00 mg/dL   GFR calc non Af Amer 58 (L) >59 mL/min/1.73   GFR calc Af Amer 67 >59 mL/min/1.73   BUN/Creatinine Ratio 7 (L) 12 - 28   Sodium 143 134 - 144 mmol/L   Potassium 3.9 3.5 - 5.2 mmol/L   Chloride 103 96 - 106 mmol/L   CO2 26 20 - 29 mmol/L   Calcium 9.4 8.7 - 10.3 mg/dL   Total Protein 7.2 6.0 - 8.5 g/dL   Albumin 4.0 3.6 - 4.8 g/dL   Globulin, Total 3.2 1.5 - 4.5 g/dL   Albumin/Globulin Ratio 1.3 1.2 - 2.2   Bilirubin Total 0.4 0.0 - 1.2 mg/dL   Alkaline Phosphatase 100 39 - 117 IU/L   AST 21 0 - 40 IU/L   ALT 17 0 - 32 IU/L  CBC  Result Value Ref Range   WBC 10.2 3.4 - 10.8  x10E3/uL   RBC 4.74 3.77 - 5.28 x10E6/uL   Hemoglobin 12.7 11.1 - 15.9 g/dL   Hematocrit 39.9 34.0 - 46.6 %   MCV 84 79 - 97 fL   MCH 26.8 26.6 - 33.0 pg   MCHC 31.8 31.5 - 35.7 g/dL   RDW 14.5 12.3 - 15.4 %   Platelets 245 150 - 379 x10E3/uL      Assessment & Plan:   Problem List Items Addressed This Visit      Unprioritized   Hypertension    Likely white coat.  Will look at monitoring at home.        Major depression, chronic    She is doing well not increasing the Abilify to 5 mg.  Will continue with 2 mg. Dosing.            Follow up plan: Return in about 3 months (around 01/19/2018).

## 2017-10-19 NOTE — Assessment & Plan Note (Signed)
She is doing well not increasing the Abilify to 5 mg.  Will continue with 2 mg. Dosing.

## 2017-10-19 NOTE — Assessment & Plan Note (Signed)
Likely white coat.  Will look at monitoring at home.

## 2017-10-20 ENCOUNTER — Other Ambulatory Visit: Payer: Self-pay | Admitting: Family Medicine

## 2017-10-20 MED ORDER — B-12 1000 MCG PO TABS: 1.0000 | ORAL_TABLET | Freq: Every day | ORAL | Status: DC

## 2018-01-04 ENCOUNTER — Ambulatory Visit: Payer: Medicare Other | Admitting: Unknown Physician Specialty

## 2018-01-14 ENCOUNTER — Encounter: Payer: Self-pay | Admitting: Unknown Physician Specialty

## 2018-01-14 ENCOUNTER — Ambulatory Visit (INDEPENDENT_AMBULATORY_CARE_PROVIDER_SITE_OTHER): Payer: Medicare Other | Admitting: Unknown Physician Specialty

## 2018-01-14 DIAGNOSIS — F341 Dysthymic disorder: Secondary | ICD-10-CM

## 2018-01-14 DIAGNOSIS — F329 Major depressive disorder, single episode, unspecified: Secondary | ICD-10-CM

## 2018-01-14 DIAGNOSIS — I10 Essential (primary) hypertension: Secondary | ICD-10-CM | POA: Diagnosis not present

## 2018-01-14 MED ORDER — LOSARTAN POTASSIUM-HCTZ 100-25 MG PO TABS
1.0000 | ORAL_TABLET | Freq: Every day | ORAL | 3 refills | Status: DC
Start: 2018-01-14 — End: 2019-02-22

## 2018-01-14 NOTE — Assessment & Plan Note (Signed)
Stable, continue present medications.   

## 2018-01-14 NOTE — Assessment & Plan Note (Addendum)
Poor control.  Increase Losartan to 100 mg.  Increase HCTZ to 25 mg/day.  Will start checking it at home.

## 2018-01-14 NOTE — Progress Notes (Signed)
BP (!) 182/97 (BP Location: Left Arm, Cuff Size: Normal)   Pulse 75   Temp 98.1 F (36.7 C) (Oral)   Wt 176 lb 9.6 oz (80.1 kg)   LMP  (LMP Unknown)   SpO2 98%   BMI 34.49 kg/m    Subjective:    Patient ID: Lisa Bass, female    DOB: 12/07/1951, 67 y.o.   MRN: [id number]  HPI: Lisa Bass is a 67 y.o. female  Chief Complaint  Patient presents with  . Depression  . Hypertension   Hypertension Using medications without difficulty.  BP high today.  Instructed to check at home as she feels it is due to white coat.  She hasn't yet started checking her BP at home.     No problems or lightheadedness No chest pain with exertion or shortness of breath No Edema  Mood Doing much better.  States she is able to sit still and read books, look at the computer, and watch TV.   Depression screen [facility] 2/9 01/14/2018 10/19/2017 09/21/2017 09/15/2017 08/24/2017  Decreased Interest 0 0 1 1 1   Down, Depressed, Hopeless 1 1 1 1 1   PHQ - 2 Score 1 1 2 2 2   Altered sleeping 0 1 1 0 1  Tired, decreased energy 0 0 1 0 1  Change in appetite 0 1 1 0 1  Feeling bad or failure about yourself  0 0 1 1 1   Trouble concentrating 0 0 1 3 1   Moving slowly or fidgety/restless 1 1 2  0 1  Suicidal thoughts 0 0 0 0 0  PHQ-9 Score 2 4 9 6 8   Difficult doing work/chores - - - Somewhat difficult -  Some recent data might be hidden      Relevant past medical, surgical, family and social history reviewed and updated as indicated. Interim medical history since our last visit reviewed. Allergies and medications reviewed and updated.  Review of Systems  Per HPI unless specifically indicated above     Objective:    BP (!) 182/97 (BP Location: Left Arm, Cuff Size: Normal)   Pulse 75   Temp 98.1 F (36.7 C) (Oral)   Wt 176 lb 9.6 oz (80.1 kg)   LMP  (LMP Unknown)   SpO2 98%   BMI 34.49 kg/m   Wt Readings from Last 3 Encounters:  01/14/18 176 lb 9.6 oz (80.1 kg)  10/19/17 174 lb (78.9 kg)    09/21/17 169 lb 9.6 oz (76.9 kg)    Physical Exam  Constitutional: She is oriented to person, place, and time. She appears well-developed and well-nourished. No distress.  HENT:  Head: Normocephalic and atraumatic.  Eyes: Conjunctivae and lids are normal. Right eye exhibits no discharge. Left eye exhibits no discharge. No scleral icterus.  Neck: Normal range of motion. Neck supple. No JVD present. Carotid bruit is not present.  Cardiovascular: Normal rate, regular rhythm and normal heart sounds.  Pulmonary/Chest: Effort normal and breath sounds normal.  Abdominal: Normal appearance. There is no splenomegaly or hepatomegaly.  Musculoskeletal: Normal range of motion.  Neurological: She is alert and oriented to person, place, and time.  Skin: Skin is warm, dry and intact. No rash noted. No pallor.  Psychiatric: She has a normal mood and affect. Her behavior is normal. Judgment and thought content normal.    Results for orders placed or performed in visit on 09/15/17  B12  Result Value Ref Range   Vitamin B-12 218 (L) 232 - 1,245  pg/mL  TSH  Result Value Ref Range   TSH 2.990 0.450 - 4.500 uIU/mL  RPR  Result Value Ref Range   RPR Ser Ql Non Reactive Non Reactive  HgB A1c  Result Value Ref Range   Hgb A1c MFr Bld 5.9 (H) 4.8 - 5.6 %   Est. average glucose Bld gHb Est-mCnc 123 mg/dL  Comprehensive Metabolic Panel (CMET)  Result Value Ref Range   Glucose 97 65 - 99 mg/dL   BUN 7 (L) 8 - 27 mg/dL   Creatinine, Ser 1.00 0.57 - 1.00 mg/dL   GFR calc non Af Amer 58 (L) >59 mL/min/1.73   GFR calc Af Amer 67 >59 mL/min/1.73   BUN/Creatinine Ratio 7 (L) 12 - 28   Sodium 143 134 - 144 mmol/L   Potassium 3.9 3.5 - 5.2 mmol/L   Chloride 103 96 - 106 mmol/L   CO2 26 20 - 29 mmol/L   Calcium 9.4 8.7 - 10.3 mg/dL   Total Protein 7.2 6.0 - 8.5 g/dL   Albumin 4.0 3.6 - 4.8 g/dL   Globulin, Total 3.2 1.5 - 4.5 g/dL   Albumin/Globulin Ratio 1.3 1.2 - 2.2   Bilirubin Total 0.4 0.0 - 1.2  mg/dL   Alkaline Phosphatase 100 39 - 117 IU/L   AST 21 0 - 40 IU/L   ALT 17 0 - 32 IU/L  CBC  Result Value Ref Range   WBC 10.2 3.4 - 10.8 x10E3/uL   RBC 4.74 3.77 - 5.28 x10E6/uL   Hemoglobin 12.7 11.1 - 15.9 g/dL   Hematocrit 39.9 34.0 - 46.6 %   MCV 84 79 - 97 fL   MCH 26.8 26.6 - 33.0 pg   MCHC 31.8 31.5 - 35.7 g/dL   RDW 14.5 12.3 - 15.4 %   Platelets 245 150 - 379 x10E3/uL      Assessment & Plan:   Problem List Items Addressed This Visit      Unprioritized   Hypertension    Poor control.  Increase Losartan to 100 mg.  Increase HCTZ to 25 mg/day.  Will start checking it at home.        Relevant Medications   losartan-hydrochlorothiazide (HYZAAR) 100-25 MG tablet   Major depression, chronic    Stable, continue present medications.            Follow up plan: Return in about 4 weeks (around 02/11/2018).

## 2018-02-11 ENCOUNTER — Encounter: Payer: Self-pay | Admitting: Unknown Physician Specialty

## 2018-02-11 ENCOUNTER — Ambulatory Visit (INDEPENDENT_AMBULATORY_CARE_PROVIDER_SITE_OTHER): Payer: Medicare Other

## 2018-02-11 ENCOUNTER — Ambulatory Visit (INDEPENDENT_AMBULATORY_CARE_PROVIDER_SITE_OTHER): Payer: Medicare Other | Admitting: Unknown Physician Specialty

## 2018-02-11 VITALS — BP 138/78 | HR 88 | Temp 97.9°F | Ht 60.0 in | Wt 179.1 lb

## 2018-02-11 VITALS — BP 138/78 | HR 88 | Temp 97.9°F | Resp 16 | Ht 60.0 in | Wt 179.1 lb

## 2018-02-11 DIAGNOSIS — Z0000 Encounter for general adult medical examination without abnormal findings: Secondary | ICD-10-CM | POA: Diagnosis not present

## 2018-02-11 DIAGNOSIS — Z1231 Encounter for screening mammogram for malignant neoplasm of breast: Secondary | ICD-10-CM

## 2018-02-11 DIAGNOSIS — Z13820 Encounter for screening for osteoporosis: Secondary | ICD-10-CM

## 2018-02-11 DIAGNOSIS — I10 Essential (primary) hypertension: Secondary | ICD-10-CM | POA: Diagnosis not present

## 2018-02-11 DIAGNOSIS — E538 Deficiency of other specified B group vitamins: Secondary | ICD-10-CM

## 2018-02-11 DIAGNOSIS — R944 Abnormal results of kidney function studies: Secondary | ICD-10-CM

## 2018-02-11 DIAGNOSIS — Z1239 Encounter for other screening for malignant neoplasm of breast: Secondary | ICD-10-CM

## 2018-02-11 NOTE — Progress Notes (Signed)
BP 138/78   Pulse 88   Temp 97.9 F (36.6 C) (Temporal)   Ht 5' (1.524 m)   Wt 179 lb 1.6 oz (81.2 kg)   LMP  (LMP Unknown)   BMI 34.98 kg/m    Subjective:    Patient ID: Lisa Bass, female    DOB: [birth year], 67 y.o.   MRN: [id number]  HPI: Lisa Bass is a 67 y.o. female  Chief Complaint  Patient presents with  . Hypertension    4 week f/up    Hypertension Using medications without difficulty Average home BPs SBP 117-120  No problems or lightheadedness No chest pain with exertion or shortness of breath No Edema  B12 deficiency Taking oral supplements.  Needs recheck of labs    Relevant past medical, surgical, family and social history reviewed and updated as indicated. Interim medical history since our last visit reviewed. Allergies and medications reviewed and updated.  Review of Systems  Constitutional: Negative.   Respiratory: Negative.   Cardiovascular: Negative.   Psychiatric/Behavioral: Negative.     Per HPI unless specifically indicated above     Objective:    BP 138/78   Pulse 88   Temp 97.9 F (36.6 C) (Temporal)   Ht 5' (1.524 m)   Wt 179 lb 1.6 oz (81.2 kg)   LMP  (LMP Unknown)   BMI 34.98 kg/m   Wt Readings from Last 3 Encounters:  02/11/18 179 lb 1.6 oz (81.2 kg)  02/11/18 179 lb 1.6 oz (81.2 kg)  01/14/18 176 lb 9.6 oz (80.1 kg)    Physical Exam  Constitutional: She is oriented to person, place, and time. She appears well-developed and well-nourished. No distress.  HENT:  Head: Normocephalic and atraumatic.  Eyes: Conjunctivae and lids are normal. Right eye exhibits no discharge. Left eye exhibits no discharge. No scleral icterus.  Neck: Normal range of motion. Neck supple. No JVD present. Carotid bruit is not present.  Cardiovascular: Normal rate, regular rhythm and normal heart sounds.  Pulmonary/Chest: Effort normal and breath sounds normal.  Abdominal: Normal appearance. There is no splenomegaly or hepatomegaly.    Musculoskeletal: Normal range of motion.  Neurological: She is alert and oriented to person, place, and time.  Skin: Skin is warm, dry and intact. No rash noted. No pallor.  Psychiatric: She has a normal mood and affect. Her behavior is normal. Judgment and thought content normal.    Results for orders placed or performed in visit on 09/15/17  B12  Result Value Ref Range   Vitamin B-12 218 (L) 232 - 1,245 pg/mL  TSH  Result Value Ref Range   TSH 2.990 0.450 - 4.500 uIU/mL  RPR  Result Value Ref Range   RPR Ser Ql Non Reactive Non Reactive  HgB A1c  Result Value Ref Range   Hgb A1c MFr Bld 5.9 (H) 4.8 - 5.6 %   Est. average glucose Bld gHb Est-mCnc 123 mg/dL  Comprehensive Metabolic Panel (CMET)  Result Value Ref Range   Glucose 97 65 - 99 mg/dL   BUN 7 (L) 8 - 27 mg/dL   Creatinine, Ser 1.00 0.57 - 1.00 mg/dL   GFR calc non Af Amer 58 (L) >59 mL/min/1.73   GFR calc Af Amer 67 >59 mL/min/1.73   BUN/Creatinine Ratio 7 (L) 12 - 28   Sodium 143 134 - 144 mmol/L   Potassium 3.9 3.5 - 5.2 mmol/L   Chloride 103 96 - 106 mmol/L   CO2 26 20 -  29 mmol/L   Calcium 9.4 8.7 - 10.3 mg/dL   Total Protein 7.2 6.0 - 8.5 g/dL   Albumin 4.0 3.6 - 4.8 g/dL   Globulin, Total 3.2 1.5 - 4.5 g/dL   Albumin/Globulin Ratio 1.3 1.2 - 2.2   Bilirubin Total 0.4 0.0 - 1.2 mg/dL   Alkaline Phosphatase 100 39 - 117 IU/L   AST 21 0 - 40 IU/L   ALT 17 0 - 32 IU/L  CBC  Result Value Ref Range   WBC 10.2 3.4 - 10.8 x10E3/uL   RBC 4.74 3.77 - 5.28 x10E6/uL   Hemoglobin 12.7 11.1 - 15.9 g/dL   Hematocrit 39.9 34.0 - 46.6 %   MCV 84 79 - 97 fL   MCH 26.8 26.6 - 33.0 pg   MCHC 31.8 31.5 - 35.7 g/dL   RDW 14.5 12.3 - 15.4 %   Platelets 245 150 - 379 x10E3/uL      Assessment & Plan:   Problem List Items Addressed This Visit      Unprioritized   B12 deficiency - Primary    On oral supplementation.  Will check levels today      Relevant Orders   Vitamin B12   Comprehensive metabolic panel    Hypertension    Stable, continue present medications.        Relevant Orders   Comprehensive metabolic panel    Other Visit Diagnoses    Decreased GFR       noted last visit GFR of 58. Recheck today   Relevant Orders   Comprehensive metabolic panel       Follow up plan: Return for physical.

## 2018-02-11 NOTE — Assessment & Plan Note (Signed)
Stable, continue present medications.   

## 2018-02-11 NOTE — Assessment & Plan Note (Signed)
On oral supplementation.  Will check levels today

## 2018-02-11 NOTE — Patient Instructions (Signed)
Lisa Bass , Thank you for taking time to come for your Medicare Wellness Visit. I appreciate your ongoing commitment to your health goals. Please review the following plan we discussed and let me know if I can assist you in the future.   Screening recommendations/referrals: Colonoscopy: completed 09/07/2017, due 08/2027 Mammogram: Please call [phone] to schedule your mammogram.  Bone Density: Please call [phone] to schedule your bone density Recommended yearly ophthalmology/optometry visit for glaucoma screening and checkup Recommended yearly dental visit for hygiene and checkup  Vaccinations: Influenza vaccine: declined Pneumococcal vaccine: up to date  Tdap vaccine: due, check with your insurance company for coverage Shingles vaccine:  due, check with your insurance company for coverage  Advanced directives: Please bring a copy of your health care power of attorney and living will to the office at your convenience.  Conditions/risks identified: Recommend drinking at least 6-8 glasses of water a day    Next appointment: Follow up in one year for your annual wellness exam.    Preventive Care 65 Years and Older, Female Preventive care refers to lifestyle choices and visits with your health care provider that can promote health and wellness. What does preventive care include?  A yearly physical exam. This is also called an annual well check.  Dental exams once or twice a year.  Routine eye exams. Ask your health care provider how often you should have your eyes checked.  Personal lifestyle choices, including:  Daily care of your teeth and gums.  Regular physical activity.  Eating a healthy diet.  Avoiding tobacco and drug use.  Limiting alcohol use.  Practicing safe sex.  Taking low-dose aspirin every day.  Taking vitamin and mineral supplements as recommended by your health care provider. What happens during an annual well check? The services and screenings  done by your health care provider during your annual well check will depend on your age, overall health, lifestyle risk factors, and family history of disease. Counseling  Your health care provider may ask you questions about your:  Alcohol use.  Tobacco use.  Drug use.  Emotional well-being.  Home and relationship well-being.  Sexual activity.  Eating habits.  History of falls.  Memory and ability to understand (cognition).  Work and work Statistician.  Reproductive health. Screening  You may have the following tests or measurements:  Height, weight, and BMI.  Blood pressure.  Lipid and cholesterol levels. These may be checked every 5 years, or more frequently if you are over 72 years old.  Skin check.  Lung cancer screening. You may have this screening every year starting at age 8 if you have a 30-pack-year history of smoking and currently smoke or have quit within the past 15 years.  Fecal occult blood test (FOBT) of the stool. You may have this test every year starting at age 70.  Flexible sigmoidoscopy or colonoscopy. You may have a sigmoidoscopy every 5 years or a colonoscopy every 10 years starting at age 78.  Hepatitis C blood test.  Hepatitis B blood test.  Sexually transmitted disease (STD) testing.  Diabetes screening. This is done by checking your blood sugar (glucose) after you have not eaten for a while (fasting). You may have this done every 1-3 years.  Bone density scan. This is done to screen for osteoporosis. You may have this done starting at age 71.  Mammogram. This may be done every 1-2 years. Talk to your health care provider about how often you should have regular mammograms. Talk with  your health care provider about your test results, treatment options, and if necessary, the need for more tests. Vaccines  Your health care provider may recommend certain vaccines, such as:  Influenza vaccine. This is recommended every year.  Tetanus,  diphtheria, and acellular pertussis (Tdap, Td) vaccine. You may need a Td booster every 10 years.  Zoster vaccine. You may need this after age 53.  Pneumococcal 13-valent conjugate (PCV13) vaccine. One dose is recommended after age 67.  Pneumococcal polysaccharide (PPSV23) vaccine. One dose is recommended after age 32. Talk to your health care provider about which screenings and vaccines you need and how often you need them. This information is not intended to replace advice given to you by your health care provider. Make sure you discuss any questions you have with your health care provider. Document Released: 01/03/2016 Document Revised: 08/26/2016 Document Reviewed: 10/08/2015 Elsevier Interactive Patient Education  2017 [place] Prevention in the Home Falls can cause injuries. They can happen to people of all ages. There are many things you can do to make your home safe and to help prevent falls. What can I do on the outside of my home?  Regularly fix the edges of walkways and driveways and fix any cracks.  Remove anything that might make you trip as you walk through a door, such as a raised step or threshold.  Trim any bushes or trees on the path to your home.  Use bright outdoor lighting.  Clear any walking paths of anything that might make someone trip, such as rocks or tools.  Regularly check to see if handrails are loose or broken. Make sure that both sides of any steps have handrails.  Any raised decks and porches should have guardrails on the edges.  Have any leaves, snow, or ice cleared regularly.  Use sand or salt on walking paths during winter.  Clean up any spills in your garage right away. This includes oil or grease spills. What can I do in the bathroom?  Use night lights.  Install grab bars by the toilet and in the tub and shower. Do not use towel bars as grab bars.  Use non-skid mats or decals in the tub or shower.  If you need to sit down in  the shower, use a plastic, non-slip stool.  Keep the floor dry. Clean up any water that spills on the floor as soon as it happens.  Remove soap buildup in the tub or shower regularly.  Attach bath mats securely with double-sided non-slip rug tape.  Do not have throw rugs and other things on the floor that can make you trip. What can I do in the bedroom?  Use night lights.  Make sure that you have a light by your bed that is easy to reach.  Do not use any sheets or blankets that are too big for your bed. They should not hang down onto the floor.  Have a firm chair that has side arms. You can use this for support while you get dressed.  Do not have throw rugs and other things on the floor that can make you trip. What can I do in the kitchen?  Clean up any spills right away.  Avoid walking on wet floors.  Keep items that you use a lot in easy-to-reach places.  If you need to reach something above you, use a strong step stool that has a grab bar.  Keep electrical cords out of the way.  Do not  use floor polish or wax that makes floors slippery. If you must use wax, use non-skid floor wax.  Do not have throw rugs and other things on the floor that can make you trip. What can I do with my stairs?  Do not leave any items on the stairs.  Make sure that there are handrails on both sides of the stairs and use them. Fix handrails that are broken or loose. Make sure that handrails are as long as the stairways.  Check any carpeting to make sure that it is firmly attached to the stairs. Fix any carpet that is loose or worn.  Avoid having throw rugs at the top or bottom of the stairs. If you do have throw rugs, attach them to the floor with carpet tape.  Make sure that you have a light switch at the top of the stairs and the bottom of the stairs. If you do not have them, ask someone to add them for you. What else can I do to help prevent falls?  Wear shoes that:  Do not have high  heels.  Have rubber bottoms.  Are comfortable and fit you well.  Are closed at the toe. Do not wear sandals.  If you use a stepladder:  Make sure that it is fully opened. Do not climb a closed stepladder.  Make sure that both sides of the stepladder are locked into place.  Ask someone to hold it for you, if possible.  Clearly mark and make sure that you can see:  Any grab bars or handrails.  First and last steps.  Where the edge of each step is.  Use tools that help you move around (mobility aids) if they are needed. These include:  Canes.  Walkers.  Scooters.  Crutches.  Turn on the lights when you go into a dark area. Replace any light bulbs as soon as they burn out.  Set up your furniture so you have a clear path. Avoid moving your furniture around.  If any of your floors are uneven, fix them.  If there are any pets around you, be aware of where they are.  Review your medicines with your doctor. Some medicines can make you feel dizzy. This can increase your chance of falling. Ask your doctor what other things that you can do to help prevent falls. This information is not intended to replace advice given to you by your health care provider. Make sure you discuss any questions you have with your health care provider. Document Released: 10/03/2009 Document Revised: 05/14/2016 Document Reviewed: 01/11/2015 Elsevier Interactive Patient Education  2017 [employer].

## 2018-02-11 NOTE — Progress Notes (Signed)
Subjective:   Lisa Bass is a 67 y.o. female who presents for Medicare Annual (Subsequent) preventive examination.  Review of Systems:   Cardiac Risk Factors include: hypertension;advanced age (>39men, >34 women);dyslipidemia;obesity (BMI >30kg/m2)     Objective:     Vitals: BP 138/78 (BP Location: Left Arm, Patient Position: Sitting)   Pulse 88   Temp 97.9 F (36.6 C) (Temporal)   Resp 16   Ht 5' (1.524 m)   Wt 179 lb 1.6 oz (81.2 kg)   LMP  (LMP Unknown)   BMI 34.98 kg/m   Body mass index is 34.98 kg/m.  Advanced Directives 02/11/2018 09/07/2017 12/22/2016 07/29/2016 09/23/2015  Does Patient Have a Medical Advance Directive? No No No No Yes  Type of Advance Directive - - - - Press photographer;Living will  Would patient like information on creating a medical advance directive? Yes (MAU/Ambulatory/Procedural Areas - Information given) - - No - patient declined information -    Tobacco Social History   Tobacco Use  Smoking Status Never Smoker  Smokeless Tobacco Never Used     Counseling given: Not Answered   Clinical Intake:  Pre-visit preparation completed: Yes  Pain : No/denies pain     Nutritional Status: BMI > 30  Obese Nutritional Risks: None Diabetes: No  How often do you need to have someone help you when you read instructions, pamphlets, or other written materials from your doctor or pharmacy?: 1 - Never What is the last grade level you completed in school?: 12th grade   Interpreter Needed?: No  Information entered by :: Zamya Culhane,LPN   Past Medical History:  Diagnosis Date  . Allergy   . Asthma   . Cataract   . CHF (congestive heart failure) ([place])   . Depression   . Hyperlipidemia   . Hypertension   . OCD (obsessive compulsive disorder)   . Palpitations   . Perimenopause    Past Surgical History:  Procedure Laterality Date  . CHOLECYSTECTOMY    . COLONOSCOPY WITH PROPOFOL N/A 09/07/2017   Procedure: COLONOSCOPY WITH  PROPOFOL;  Surgeon: [name], MD;  [facility] ENDOSCOPY;  Service: Endoscopy;  Laterality: N/A;  . open heart surgery    . ROOT CANAL     x2   Family History  Problem Relation Age of Onset  . Hypertension Mother   . Dementia Father   . Hypertension Father   . Diabetes Father   . Depression Father   . GI problems Daughter   . Heart disease Maternal Grandfather   . Depression Sister    Social History   Socioeconomic History  . Marital status: Married    Spouse name: None  . Number of children: None  . Years of education: None  . Highest education level: None  Social Needs  . Financial resource strain: Not hard at all  . Food insecurity - worry: Never true  . Food insecurity - inability: Never true  . Transportation needs - medical: No  . Transportation needs - non-medical: No  Occupational History  . None  Tobacco Use  . Smoking status: Never Smoker  . Smokeless tobacco: Never Used  Substance and Sexual Activity  . Alcohol use: No    Alcohol/week: 0.0 oz  . Drug use: No  . Sexual activity: Yes  Other Topics Concern  . None  Social History Narrative  . None    Outpatient Encounter Medications as of 02/11/2018  Medication Sig  . ARIPiprazole (ABILIFY) 2  MG tablet Take 1 tablet (2 mg total) by mouth daily.  Marland Kitchen aspirin 81 MG tablet Take 81 mg by mouth daily.  . Cyanocobalamin (B-12) 1000 MCG TABS Take 1 tablet by mouth daily.  Marland Kitchen losartan-hydrochlorothiazide (HYZAAR) 100-25 MG tablet Take 1 tablet by mouth daily.  Marland Kitchen PARoxetine (PAXIL) 40 MG tablet Take 20 mg by mouth daily. Take 1/2 dose   No facility-administered encounter medications on file as of 02/11/2018.     Activities of Daily Living In your present state of health, do you have any difficulty performing the following activities: 02/11/2018 09/15/2017  Hearing? N N  Vision? N N  Difficulty concentrating or making decisions? N Y  Walking or climbing stairs? N N  Dressing or bathing? N N  Doing  errands, shopping? N Y  Conservation officer, nature and eating ? N -  Using the Toilet? N -  In the past six months, have you accidently leaked urine? N -  Do you have problems with loss of bowel control? N -  Managing your Medications? N -  Managing your Finances? N -  Housekeeping or managing your Housekeeping? N -  Some recent data might be hidden    Patient Care Team: [name], NP as PCP - General (Nurse Practitioner) [name], NP as PCP - Family Medicine (Nurse Practitioner) Margaretha Sheffield, MD (Otolaryngology) Yolonda Kida, MD as Consulting Physician (Cardiology)    Assessment:   This is a routine wellness examination for Shelita.  Exercise Activities and Dietary recommendations Current Exercise Habits: The patient does not participate in regular exercise at present, Exercise limited by: None identified  Goals    . DIET - INCREASE WATER INTAKE     Recommend drinking at least 6-8 glasses of water a day        Fall Risk Fall Risk  02/11/2018 01/14/2018 09/21/2017 09/15/2017 08/24/2017  Falls in the past year? No No No No No   Is the patient's home free of loose throw rugs in walkways, pet beds, electrical cords, etc?   yes      Grab bars in the bathroom? yes      Handrails on the stairs?   yes      Adequate lighting?   yes  Timed Get Up and Go performed: Completed in 8 seconds with no use of assistive devices, steady gait. No intervention needed at this time.   Depression Screen PHQ 2/9 Scores 02/11/2018 01/14/2018 10/19/2017 09/21/2017  PHQ - 2 Score 0 1 1 2   PHQ- 9 Score 0 2 4 9      Cognitive Function     6CIT Screen 02/11/2018  What Year? 0 points  What month? 0 points  What time? 0 points  Count back from 20 0 points  Months in reverse 0 points  Repeat phrase 0 points  Total Score 0    Immunization History  Administered Date(s) Administered  . Pneumococcal Conjugate-13 09/23/2015  . Pneumococcal Polysaccharide-23 12/22/2016  . Td 03/11/1999     Qualifies for Shingles Vaccine?discussed shingrix vaccine  Screening Tests Health Maintenance  Topic Date Due  . MAMMOGRAM  08/21/2007  . DEXA SCAN  08/20/2015  . INFLUENZA VACCINE  03/20/2018 (Originally 07/21/2017)  . TETANUS/TDAP  06/16/2018 (Originally 03/10/2009)  . COLONOSCOPY  09/08/2027  . Hepatitis C Screening  Completed  . PNA vac Low Risk Adult  Completed    Cancer Screenings: Lung: Low Dose CT Chest recommended if Age 81-80 years, 30 pack-year currently smoking OR have quit w/in  15years. Patient does not qualify. Breast:  Up to date on Mammogram? No  ordered Up to date of Bone Density/Dexa? No ordered Colorectal: completed 09/07/2017  Additional Screenings:  Hepatitis B/HIV/Syphillis:not indicated Hepatitis C Screening:  Completed 09/23/2015     Plan:    I have personally reviewed and addressed the Medicare Annual Wellness questionnaire and have noted the following in the patient's chart:  A. Medical and social history B. Use of alcohol, tobacco or illicit drugs  C. Current medications and supplements D. Functional ability and status E.  Nutritional status F.  Physical activity G. Advance directives H. List of other physicians I.  Hospitalizations, surgeries, and ER visits in previous 12 months J.  [place] such as hearing and vision if needed, cognitive and depression L. Referrals and appointments   In addition, I have reviewed and discussed with patient certain preventive protocols, quality metrics, and best practice recommendations. A written personalized care plan for preventive services as well as general preventive health recommendations were provided to patient.   Signed,  Tyler Aas, LPN Nurse Health Advisor   Nurse Notes:none

## 2018-02-12 LAB — COMPREHENSIVE METABOLIC PANEL
ALT: 13 IU/L (ref 0–32)
AST: 14 IU/L (ref 0–40)
Albumin/Globulin Ratio: 1.3 (ref 1.2–2.2)
Albumin: 4.1 g/dL (ref 3.6–4.8)
Alkaline Phosphatase: 119 IU/L — ABNORMAL HIGH (ref 39–117)
BUN/Creatinine Ratio: 13 (ref 12–28)
BUN: 11 mg/dL (ref 8–27)
Bilirubin Total: 0.3 mg/dL (ref 0.0–1.2)
CALCIUM: 9.9 mg/dL (ref 8.7–10.3)
CO2: 26 mmol/L (ref 20–29)
CREATININE: 0.85 mg/dL (ref 0.57–1.00)
Chloride: 102 mmol/L (ref 96–106)
GFR calc Af Amer: 82 mL/min/{1.73_m2} (ref >59)
GFR, EST NON AFRICAN AMERICAN: 71 mL/min/{1.73_m2} (ref >59)
Globulin, Total: 3.2 g/dL (ref 1.5–4.5)
Glucose: 113 mg/dL — ABNORMAL HIGH (ref 65–99)
Potassium: 5.4 mmol/L — ABNORMAL HIGH (ref 3.5–5.2)
Sodium: 142 mmol/L (ref 134–144)
Total Protein: 7.3 g/dL (ref 6.0–8.5)

## 2018-02-12 LAB — VITAMIN B12: VITAMIN B 12: 251 pg/mL (ref 232–1245)

## 2018-02-14 NOTE — Progress Notes (Signed)
Notified pt by mychart

## 2018-05-18 ENCOUNTER — Telehealth: Payer: Self-pay | Admitting: Unknown Physician Specialty

## 2018-05-18 MED ORDER — PAROXETINE HCL 40 MG PO TABS: 20.0000 mg | ORAL_TABLET | Freq: Every day | ORAL | 1 refills | Status: DC

## 2018-05-18 NOTE — Telephone Encounter (Signed)
Paxil refill Last Refill: 06/30/17 by historical provider Last OV: 02/11/18 PCP: Regino Schultze [facility] [place]

## 2018-05-18 NOTE — Telephone Encounter (Unsigned)
Copied from [place] [id number]. Topic: General - Other >> May 18, 2018  5:05 PM Yvette Rack wrote: Reason for CRM: Claiborne Billings with [facility] requested clarification of the Rx for PARoxetine (PAXIL) 40 MG tablet. Cb# [phone]

## 2018-05-18 NOTE — Telephone Encounter (Signed)
Copied from [place] [id number]. Topic: Quick Communication - Rx Refill/Question >> May 18, 2018 11:37 AM Ether Griffins B wrote: Medication: PARoxetine (PAXIL) 40 MG tablet 90 day supply.   Has the patient contacted their pharmacy? Yes.   (Agent: If no, request that the patient contact the pharmacy for the refill.) (Agent: If yes, when and what did the pharmacy advise?)  Preferred Pharmacy (with phone number or street name): [id number] [place], [place] [address]  Agent: Please be advised that RX refills may take up to 3 business days. We ask that you follow-up with your pharmacy.

## 2018-05-19 NOTE — Telephone Encounter (Signed)
Pt does not want the medication for [facility]. Pt is [facility]

## 2018-05-19 NOTE — Telephone Encounter (Signed)
Lisa Bass w/[facility] [phone] needs clarifications on the Paxil medication.  1) Instructions say 901 tablets  2) 1/2 20mg  dose

## 2018-05-20 MED ORDER — PAROXETINE HCL 40 MG PO TABS: 20.0000 mg | ORAL_TABLET | Freq: Every day | ORAL | 1 refills | Status: DC

## 2018-05-20 NOTE — Telephone Encounter (Signed)
Rewrote rx

## 2018-05-20 NOTE — Addendum Note (Signed)
Addended by: [name] on: 05/20/2018 07:35 AM   Modules accepted: Orders

## 2018-09-14 ENCOUNTER — Other Ambulatory Visit: Payer: Self-pay

## 2018-09-14 NOTE — Patient Outreach (Signed)
[place] [facility]) Care Management  09/14/2018  Lisa Bass Apr 04, 1951 [id number]   Medication Adherence call to Lisa Bass spoke with patient she said someone from [facility] had already call her and they are already helping her. patient is due on Losartan / Hctz 100/25 mg. Lisa Bass is showing past due under [place].  [place] Management Direct Dial [phone]  Fax [phone] [name]

## 2019-01-19 ENCOUNTER — Telehealth: Payer: Self-pay | Admitting: Unknown Physician Specialty

## 2019-01-19 NOTE — Telephone Encounter (Signed)
Spoke with patient to reschedule her Medicare Annual Wellness Visit on 02/13/2019, originally at 1:45 PM.  The AWV appointment was changed to 02/13/2019 at 2:30 PM.  While on the phone, the patient wanted to know about her medication refills and could they be handled that day.  I told her if she needed any medication refills, to please call [facility] and check on her refills.  I advised her that if she was running out, to contact the office now.    I called the patient back on 02/30/2020 at 9:51 AM and offered her an appointment to see one of the providers on the same day as the AWV or sooner.  Patient declined the Office Visit appointment.  I again advised her to call [facility] if she needed any refills or any medical attention.  Patient voiced understood.  Janace Hoard, Care Guide.

## 2019-02-13 ENCOUNTER — Ambulatory Visit (INDEPENDENT_AMBULATORY_CARE_PROVIDER_SITE_OTHER): Payer: Medicare Other

## 2019-02-13 ENCOUNTER — Ambulatory Visit: Payer: Medicare Other

## 2019-02-13 ENCOUNTER — Ambulatory Visit: Payer: Self-pay

## 2019-02-13 VITALS — BP 156/84 | HR 76 | Temp 98.4°F | Resp 16 | Ht 60.0 in | Wt 184.2 lb

## 2019-02-13 DIAGNOSIS — Z0000 Encounter for general adult medical examination without abnormal findings: Secondary | ICD-10-CM | POA: Diagnosis not present

## 2019-02-13 DIAGNOSIS — Z1239 Encounter for other screening for malignant neoplasm of breast: Secondary | ICD-10-CM | POA: Diagnosis not present

## 2019-02-13 DIAGNOSIS — Z780 Asymptomatic menopausal state: Secondary | ICD-10-CM | POA: Diagnosis not present

## 2019-02-13 NOTE — Patient Instructions (Signed)
Lisa Bass , Thank you for taking time to come for your Medicare Wellness Visit. I appreciate your ongoing commitment to your health goals. Please review the following plan we discussed and let me know if I can assist you in the future.   Screening recommendations/referrals: Colonoscopy:colonoscopy done 09/07/2017 Mammogram: Please call [phone] to schedule your mammogram.  Bone Density: please call and schedule  Recommended yearly ophthalmology/optometry visit for glaucoma screening and checkup Recommended yearly dental visit for hygiene and checkup  Vaccinations: Influenza vaccine: declined Pneumococcal vaccine: done Tdap vaccine: due, check with your insurance company for coverage  Shingles vaccine: declined  Advanced directives: Advance directive discussed with you today. I have provided a copy for you to complete at home and have notarized. Once this is complete please bring a copy in to our office so we can scan it into your chart.  Conditions/risks identified: continue taking your medications as prescribed and schedule follow up with Jolene Cannady,NP.   Next appointment: Follow up with Jolene Cannady,NP in 1-2 weeks. Follow up in one year for your annual wellness exam.    Preventive Care 65 Years and Older, Female Preventive care refers to lifestyle choices and visits with your health care provider that can promote health and wellness. What does preventive care include?  A yearly physical exam. This is also called an annual well check.  Dental exams once or twice a year.  Routine eye exams. Ask your health care provider how often you should have your eyes checked.  Personal lifestyle choices, including:  Daily care of your teeth and gums.  Regular physical activity.  Eating a healthy diet.  Avoiding tobacco and drug use.  Limiting alcohol use.  Practicing safe sex.  Taking low-dose aspirin every day.  Taking vitamin and mineral supplements as recommended by  your health care provider. What happens during an annual well check? The services and screenings done by your health care provider during your annual well check will depend on your age, overall health, lifestyle risk factors, and family history of disease. Counseling  Your health care provider may ask you questions about your:  Alcohol use.  Tobacco use.  Drug use.  Emotional well-being.  Home and relationship well-being.  Sexual activity.  Eating habits.  History of falls.  Memory and ability to understand (cognition).  Work and work Statistician.  Reproductive health. Screening  You may have the following tests or measurements:  Height, weight, and BMI.  Blood pressure.  Lipid and cholesterol levels. These may be checked every 5 years, or more frequently if you are over 24 years old.  Skin check.  Lung cancer screening. You may have this screening every year starting at age 33 if you have a 30-pack-year history of smoking and currently smoke or have quit within the past 15 years.  Fecal occult blood test (FOBT) of the stool. You may have this test every year starting at age 11.  Flexible sigmoidoscopy or colonoscopy. You may have a sigmoidoscopy every 5 years or a colonoscopy every 10 years starting at age 44.  Hepatitis C blood test.  Hepatitis B blood test.  Sexually transmitted disease (STD) testing.  Diabetes screening. This is done by checking your blood sugar (glucose) after you have not eaten for a while (fasting). You may have this done every 1-3 years.  Bone density scan. This is done to screen for osteoporosis. You may have this done starting at age 54.  Mammogram. This may be done every 1-2 years. Talk  to your health care provider about how often you should have regular mammograms. Talk with your health care provider about your test results, treatment options, and if necessary, the need for more tests. Vaccines  Your health care provider may  recommend certain vaccines, such as:  Influenza vaccine. This is recommended every year.  Tetanus, diphtheria, and acellular pertussis (Tdap, Td) vaccine. You may need a Td booster every 10 years.  Zoster vaccine. You may need this after age 43.  Pneumococcal 13-valent conjugate (PCV13) vaccine. One dose is recommended after age 67.  Pneumococcal polysaccharide (PPSV23) vaccine. One dose is recommended after age 61. Talk to your health care provider about which screenings and vaccines you need and how often you need them. This information is not intended to replace advice given to you by your health care provider. Make sure you discuss any questions you have with your health care provider. Document Released: 01/03/2016 Document Revised: 08/26/2016 Document Reviewed: 10/08/2015 Elsevier Interactive Patient Education  2017 [place] Prevention in the Home Falls can cause injuries. They can happen to people of all ages. There are many things you can do to make your home safe and to help prevent falls. What can I do on the outside of my home?  Regularly fix the edges of walkways and driveways and fix any cracks.  Remove anything that might make you trip as you walk through a door, such as a raised step or threshold.  Trim any bushes or trees on the path to your home.  Use bright outdoor lighting.  Clear any walking paths of anything that might make someone trip, such as rocks or tools.  Regularly check to see if handrails are loose or broken. Make sure that both sides of any steps have handrails.  Any raised decks and porches should have guardrails on the edges.  Have any leaves, snow, or ice cleared regularly.  Use sand or salt on walking paths during winter.  Clean up any spills in your garage right away. This includes oil or grease spills. What can I do in the bathroom?  Use night lights.  Install grab bars by the toilet and in the tub and shower. Do not use towel  bars as grab bars.  Use non-skid mats or decals in the tub or shower.  If you need to sit down in the shower, use a plastic, non-slip stool.  Keep the floor dry. Clean up any water that spills on the floor as soon as it happens.  Remove soap buildup in the tub or shower regularly.  Attach bath mats securely with double-sided non-slip rug tape.  Do not have throw rugs and other things on the floor that can make you trip. What can I do in the bedroom?  Use night lights.  Make sure that you have a light by your bed that is easy to reach.  Do not use any sheets or blankets that are too big for your bed. They should not hang down onto the floor.  Have a firm chair that has side arms. You can use this for support while you get dressed.  Do not have throw rugs and other things on the floor that can make you trip. What can I do in the kitchen?  Clean up any spills right away.  Avoid walking on wet floors.  Keep items that you use a lot in easy-to-reach places.  If you need to reach something above you, use a strong step stool that  has a grab bar.  Keep electrical cords out of the way.  Do not use floor polish or wax that makes floors slippery. If you must use wax, use non-skid floor wax.  Do not have throw rugs and other things on the floor that can make you trip. What can I do with my stairs?  Do not leave any items on the stairs.  Make sure that there are handrails on both sides of the stairs and use them. Fix handrails that are broken or loose. Make sure that handrails are as long as the stairways.  Check any carpeting to make sure that it is firmly attached to the stairs. Fix any carpet that is loose or worn.  Avoid having throw rugs at the top or bottom of the stairs. If you do have throw rugs, attach them to the floor with carpet tape.  Make sure that you have a light switch at the top of the stairs and the bottom of the stairs. If you do not have them, ask someone to  add them for you. What else can I do to help prevent falls?  Wear shoes that:  Do not have high heels.  Have rubber bottoms.  Are comfortable and fit you well.  Are closed at the toe. Do not wear sandals.  If you use a stepladder:  Make sure that it is fully opened. Do not climb a closed stepladder.  Make sure that both sides of the stepladder are locked into place.  Ask someone to hold it for you, if possible.  Clearly mark and make sure that you can see:  Any grab bars or handrails.  First and last steps.  Where the edge of each step is.  Use tools that help you move around (mobility aids) if they are needed. These include:  Canes.  Walkers.  Scooters.  Crutches.  Turn on the lights when you go into a dark area. Replace any light bulbs as soon as they burn out.  Set up your furniture so you have a clear path. Avoid moving your furniture around.  If any of your floors are uneven, fix them.  If there are any pets around you, be aware of where they are.  Review your medicines with your doctor. Some medicines can make you feel dizzy. This can increase your chance of falling. Ask your doctor what other things that you can do to help prevent falls. This information is not intended to replace advice given to you by your health care provider. Make sure you discuss any questions you have with your health care provider. Document Released: 10/03/2009 Document Revised: 05/14/2016 Document Reviewed: 01/11/2015 Elsevier Interactive Patient Education  2017 [employer].

## 2019-02-13 NOTE — Progress Notes (Signed)
Subjective:   Lisa Bass is a 68 y.o. female who presents for Medicare Annual (Subsequent) preventive examination.  Review of Systems:   Cardiac Risk Factors include: advanced age (>42men, >85 women);hypertension;obesity (BMI >30kg/m2)     Objective:     Vitals: BP (!) 156/84 (BP Location: Left Arm, Cuff Size: Normal)   Pulse 76   Temp 98.4 F (36.9 C) (Oral)   Resp 16   Ht 5' (1.524 m)   Wt 184 lb 3.2 oz (83.6 kg)   LMP  (LMP Unknown)   BMI 35.97 kg/m   Body mass index is 35.97 kg/m.  Advanced Directives 02/13/2019 02/11/2018 09/07/2017 12/22/2016 07/29/2016 09/23/2015  Does Patient Have a Medical Advance Directive? No No No No No Yes  Type of Advance Directive - - - - - Press photographer;Living will  Would patient like information on creating a medical advance directive? Yes (MAU/Ambulatory/Procedural Areas - Information given) Yes (MAU/Ambulatory/Procedural Areas - Information given) - - No - patient declined information -    Tobacco Social History   Tobacco Use  Smoking Status Never Smoker  Smokeless Tobacco Never Used     Counseling given: Not Answered   Clinical Intake:  Pre-visit preparation completed: Yes  Pain : No/denies pain     Nutritional Status: BMI > 30  Obese Nutritional Risks: None Diabetes: No  How often do you need to have someone help you when you read instructions, pamphlets, or other written materials from your doctor or pharmacy?: 1 - Never What is the last grade level you completed in school?: 12th grade  Interpreter Needed?: No  Information entered by :: Findley Blankenbaker,LPN   Past Medical History:  Diagnosis Date  . Allergy   . Asthma   . Cataract   . CHF (congestive heart failure) ([place])   . Depression   . Hyperlipidemia   . Hypertension   . OCD (obsessive compulsive disorder)   . Palpitations   . Perimenopause    Past Surgical History:  Procedure Laterality Date  . CHOLECYSTECTOMY    . COLONOSCOPY WITH  PROPOFOL N/A 09/07/2017   Procedure: COLONOSCOPY WITH PROPOFOL;  Surgeon: [name], MD;  Location: [facility] ENDOSCOPY;  Service: Endoscopy;  Laterality: N/A;  . open heart surgery    . ROOT CANAL     x2   Family History  Problem Relation Age of Onset  . Hypertension Mother   . Dementia Father   . Hypertension Father   . Diabetes Father   . Depression Father   . GI problems Daughter   . Heart disease Maternal Grandfather   . Depression Sister    Social History   Socioeconomic History  . Marital status: Married    Spouse name: Not on file  . Number of children: Not on file  . Years of education: Not on file  . Highest education level: High school graduate  Occupational History  . Occupation: retired   Scientific laboratory technician  . Financial resource strain: Not hard at all  . Food insecurity:    Worry: Never true    Inability: Never true  . Transportation needs:    Medical: No    Non-medical: No  Tobacco Use  . Smoking status: Never Smoker  . Smokeless tobacco: Never Used  Substance and Sexual Activity  . Alcohol use: No    Alcohol/week: 0.0 standard drinks  . Drug use: No  . Sexual activity: Yes  Lifestyle  . Physical activity:    Days per  week: 0 days    Minutes per session: 0 min  . Stress: Not at all  Relationships  . Social connections:    Talks on phone: More than three times a week    Gets together: More than three times a week    Attends religious service: More than 4 times per year    Active member of club or organization: Yes    Attends meetings of clubs or organizations: More than 4 times per year    Relationship status: Married  Other Topics Concern  . Not on file  Social History Narrative   Very active in church     Outpatient Encounter Medications as of 02/13/2019  Medication Sig  . ARIPiprazole (ABILIFY) 2 MG tablet Take 1 tablet (2 mg total) by mouth daily.  Marland Kitchen aspirin 81 MG tablet Take 81 mg by mouth daily.  Marland Kitchen losartan-hydrochlorothiazide (HYZAAR)  100-25 MG tablet Take 1 tablet by mouth daily.  Marland Kitchen PARoxetine (PAXIL) 40 MG tablet Take 0.5 tablets (20 mg total) by mouth daily. Take 1/2 dose  . Cyanocobalamin (B-12) 1000 MCG TABS Take 1 tablet by mouth daily. (Patient not taking: Reported on 02/13/2019)   No facility-administered encounter medications on file as of 02/13/2019.     Activities of Daily Living In your present state of health, do you have any difficulty performing the following activities: 02/13/2019  Hearing? N  Vision? N  Difficulty concentrating or making decisions? N  Walking or climbing stairs? N  Dressing or bathing? N  Doing errands, shopping? N  Preparing Food and eating ? N  Using the Toilet? N  In the past six months, have you accidently leaked urine? N  Do you have problems with loss of bowel control? N  Managing your Medications? N  Managing your Finances? N  Housekeeping or managing your Housekeeping? N  Some recent data might be hidden    Patient Care Team: [name], NP as PCP - General (Nurse Practitioner) [name], NP as PCP - Family Medicine (Nurse Practitioner) Margaretha Sheffield, MD (Otolaryngology) Yolonda Kida, MD as Consulting Physician (Cardiology)    Assessment:   This is a routine wellness examination for Lisa Bass.  Exercise Activities and Dietary recommendations Current Exercise Habits: The patient does not participate in regular exercise at present, Exercise limited by: None identified  Goals    . DIET - INCREASE WATER INTAKE     Recommend drinking at least 6-8 glasses of water a day        Fall Risk Fall Risk  02/13/2019 02/11/2018 01/14/2018 09/21/2017 09/15/2017  Falls in the past year? 0 No No No No  Number falls in past yr: 0 - - - -   FALL RISK PREVENTION PERTAINING TO THE HOME:  Any stairs in or around the home? Yes  If so, are there any without handrails? No   Home free of loose throw rugs in walkways, pet beds, electrical cords, etc? Yes  Adequate lighting  in your home to reduce risk of falls? Yes   ASSISTIVE DEVICES UTILIZED TO PREVENT FALLS:  Life alert? No  Use of a cane, walker or w/c? No  Grab bars in the bathroom? No  Shower chair or bench in shower? No  Elevated toilet seat or a handicapped toilet? No   DME ORDERS:  DME order needed?  No   TIMED UP AND GO:  Was the test performed? Yes .  Length of time to ambulate 10 feet: 10 sec.   GAIT:  Appearance of gait: Gait stead-fastwithout the use of an assistive device.  Intervention(s) required? No    Depression Screen PHQ 2/9 Scores 02/13/2019 02/11/2018 01/14/2018 10/19/2017  PHQ - 2 Score 0 0 1 1  PHQ- 9 Score - 0 2 4     Cognitive Function     6CIT Screen 02/13/2019 02/11/2018  What Year? 0 points 0 points  What month? 0 points 0 points  What time? 0 points 0 points  Count back from 20 0 points 0 points  Months in reverse 0 points 0 points  Repeat phrase 0 points 0 points  Total Score 0 0    Immunization History  Administered Date(s) Administered  . Pneumococcal Conjugate-13 09/23/2015  . Pneumococcal Polysaccharide-23 12/22/2016  . Td 03/11/1999    Qualifies for Shingles Vaccine?Yes  Zostavax completed n/a. Due for Shingrix. Education has been provided regarding the importance of this vaccine. Pt has been advised to call insurance company to determine out of pocket expense. Advised may also receive vaccine at local pharmacy or Health Dept. Verbalized acceptance and understanding.  Tdap: Although this vaccine is not a covered service during a Wellness Exam, does the patient still wish to receive this vaccine today?  No .  Education has been provided regarding the importance of this vaccine. Advised may receive this vaccine at local pharmacy or Health Dept. Aware to provide a copy of the vaccination record if obtained from local pharmacy or Health Dept. Verbalized acceptance and understanding.  Flu Vaccine: Due for Flu vaccine. Does the patient want to receive  this vaccine today?  No . Education has been provided regarding the importance of this vaccine but still declined. Advised may receive this vaccine at local pharmacy or Health Dept. Aware to provide a copy of the vaccination record if obtained from local pharmacy or Health Dept. Verbalized acceptance and understanding.  Pneumococcal Vaccine: up to date   Screening Tests Health Maintenance  Topic Date Due  . MAMMOGRAM  08/21/2007  . TETANUS/TDAP  03/10/2009  . DEXA SCAN  08/20/2015  . INFLUENZA VACCINE  07/21/2018  . COLONOSCOPY  09/08/2027  . Hepatitis C Screening  Completed  . PNA vac Low Risk Adult  Completed   Cancer Screenings:  Colorectal Screening: Completed 09/07/2017. Repeat every 10 years  Mammogram: due, ordered today .Pt provided with contact info and advised to call to schedule appt.  Bone Density: Ordered today. Pt provided with contact info and advised to call to schedule appt.   Lung Cancer Screening: (Low Dose CT Chest recommended if Age 88-80 years, 30 pack-year currently smoking OR have quit w/in 15years.) does not qualify.    Additional Screening:  Hepatitis C Screening: does qualify; Completed 09/23/2015  Vision Screening: Recommended annual ophthalmology exams for early detection of glaucoma and other disorders of the eye. Is the patient up to date with their annual eye exam?  Yes  Who is the provider or what is the name of the office in which the pt attends annual eye exams? [facility]    Dental Screening: Recommended annual dental exams for proper oral hygiene  Community Resource Referral:  CRR required this visit?  No      Plan:    I have personally reviewed and addressed the Medicare Annual Wellness questionnaire and have noted the following in the patient's chart:  A. Medical and social history B. Use of alcohol, tobacco or illicit drugs  C. Current medications and supplements D. Functional ability and status E.  Nutritional status  F.    Physical activity G. Advance directives H. List of other physicians I.  Hospitalizations, surgeries, and ER visits in previous 12 months J.  [place] such as hearing and vision if needed, cognitive and depression L. Referrals and appointments   In addition, I have reviewed and discussed with patient certain preventive protocols, quality metrics, and best practice recommendations. A written personalized care plan for preventive services as well as general preventive health recommendations were provided to patient.   Signed,  Tyler Aas, LPN Nurse Health Advisor   Nurse Notes: patient states she has about 14 days left of her paxil, advised patient to schedule a physical with Jolene Cannady,NP and if she couldn't get in within the next 2 weeks to let front desk know to see if she can get a refill to last her until her appt.   patient schedule physical on 02/22/2019. Patient understands she will get refill on day of appt and to call interm if needed

## 2019-02-14 ENCOUNTER — Encounter: Payer: Medicare Other | Admitting: Nurse Practitioner

## 2019-02-22 ENCOUNTER — Ambulatory Visit (INDEPENDENT_AMBULATORY_CARE_PROVIDER_SITE_OTHER): Payer: Medicare Other | Admitting: Nurse Practitioner

## 2019-02-22 ENCOUNTER — Other Ambulatory Visit: Payer: Self-pay

## 2019-02-22 ENCOUNTER — Encounter: Payer: Self-pay | Admitting: Nurse Practitioner

## 2019-02-22 ENCOUNTER — Telehealth: Payer: Self-pay | Admitting: Nurse Practitioner

## 2019-02-22 VITALS — BP 153/82 | HR 70 | Temp 98.5°F | Ht 65.0 in | Wt 181.0 lb

## 2019-02-22 DIAGNOSIS — E538 Deficiency of other specified B group vitamins: Secondary | ICD-10-CM | POA: Diagnosis not present

## 2019-02-22 DIAGNOSIS — R7303 Prediabetes: Secondary | ICD-10-CM | POA: Diagnosis not present

## 2019-02-22 DIAGNOSIS — I10 Essential (primary) hypertension: Secondary | ICD-10-CM

## 2019-02-22 DIAGNOSIS — F329 Major depressive disorder, single episode, unspecified: Secondary | ICD-10-CM

## 2019-02-22 DIAGNOSIS — Z0000 Encounter for general adult medical examination without abnormal findings: Secondary | ICD-10-CM

## 2019-02-22 DIAGNOSIS — E782 Mixed hyperlipidemia: Secondary | ICD-10-CM

## 2019-02-22 DIAGNOSIS — Z780 Asymptomatic menopausal state: Secondary | ICD-10-CM

## 2019-02-22 MED ORDER — LOSARTAN POTASSIUM-HCTZ 100-25 MG PO TABS: 1.0000 | ORAL_TABLET | Freq: Every day | ORAL | 3 refills | Status: DC

## 2019-02-22 MED ORDER — PAROXETINE HCL 40 MG PO TABS: 20.0000 mg | ORAL_TABLET | Freq: Every day | ORAL | 2 refills | Status: DC

## 2019-02-22 MED ORDER — NYSTATIN 100000 UNIT/GM EX POWD: Freq: Two times a day (BID) | CUTANEOUS | 0 refills | Status: DC

## 2019-02-22 NOTE — Addendum Note (Signed)
Addended by: [name] T on: 02/22/2019 11:33 AM   Modules accepted: Orders

## 2019-02-22 NOTE — Assessment & Plan Note (Addendum)
Chronic, elevated today 152/82. No medication changes today. Recommend taking medication daily and monitor BP at home. Follow-up in 1 month.

## 2019-02-22 NOTE — Telephone Encounter (Signed)
Can you order paroxiotene for patient send it to [facility].    Thank you

## 2019-02-22 NOTE — Assessment & Plan Note (Addendum)
Labs today. Focus on diet and activity/exercise. Will assess need for further interventions with lab results.

## 2019-02-22 NOTE — Progress Notes (Signed)
BP (!) 153/82   Pulse 70   Temp 98.5 F (36.9 C) (Oral)   Ht 5\' 5"  (1.651 m)   Wt 181 lb (82.1 kg)   LMP  (LMP Unknown)   SpO2 98%   BMI 30.12 kg/m    Subjective:    Patient ID: [name], female    DOB: [birth year], 68 y.o.   MRN: [id number]  HPI: EMILYANNE MCGOUGH is a 68 y.o. female presenting on 02/22/2019 for comprehensive medical examination. Current medical complaints include:none  She currently lives with: husband Menopausal Symptoms: no  HYPERTENSION / HYPERLIPIDEMIA Taking losartan-HCTZ for HTN. Pt reports only taking medication about once per week. Discussed importance of taking medication daily for maximal results.  Satisfied with current treatment? yes Duration of hypertension: chronic BP monitoring frequency: not checking BP range:  BP medication side effects: no Duration of hyperlipidemia: chronic Cholesterol medication side effects: not taking- diet managed Cholesterol supplements: none Past cholesterol medications: none Medication compliance: fair compliance- reports taking once a week Aspirin: yes Recent stressors: no Recurrent headaches: no Visual changes: cataracts- vision changes associated with that Palpitations: no Dyspnea: no Chest pain: no Lower extremity edema: no Dizzy/lightheaded: no   PREDIABETES No medications, focus on diet and exercise.  Polydipsia/polyuria/polyphasia: no Visual disturbance: no Chest pain: no Paresthesias: no Blood Pressure Monitoring: not checking Retinal Examination: Up to Date Influenza: pt declines Aspirin: yes   DEPRESSION Taking paroxetine. Reports significant improvement of symptoms and mood. Endorses a good support system from her husband and friends at church. Reports staying active with church and helping others has been therapeutic.  Mood status: stable Satisfied with current treatment?: yes Symptom severity: mild  Duration of current treatment : chronic Side effects: no Medication compliance:  good compliance Psychotherapy/counseling: not currently- in the past Depressed mood: no Anxious mood: no Anhedonia: no Significant weight loss or gain: no Insomnia: no  Fatigue: no Feelings of worthlessness or guilt: no Impaired concentration/indecisiveness: no Suicidal ideations: no Hopelessness: no Crying spells: no Depression screen [facility] 2/9 02/22/2019 02/13/2019 02/11/2018 01/14/2018 10/19/2017  Decreased Interest 0 0 0 0 0  Down, Depressed, Hopeless 0 0 0 1 1  PHQ - 2 Score 0 0 0 1 1  Altered sleeping 1 - 0 0 1  Tired, decreased energy 0 - 0 0 0  Change in appetite 1 - 0 0 1  Feeling bad or failure about yourself  1 - 0 0 0  Trouble concentrating 0 - 0 0 0  Moving slowly or fidgety/restless 1 - 0 1 1  Suicidal thoughts 0 - 0 0 0  PHQ-9 Score 4 - 0 2 4  Difficult doing work/chores Not difficult at all - - - -  Some recent data might be hidden     The patient does not have a history of falls. I did complete a risk assessment for falls. A plan of care for falls was not documented.   Past Medical History:  Past Medical History:  Diagnosis Date  . Allergy   . Asthma   . Cataract   . CHF (congestive heart failure) ([place])   . Depression   . Hyperlipidemia   . Hypertension   . OCD (obsessive compulsive disorder)   . Palpitations   . Perimenopause     Surgical History:  Past Surgical History:  Procedure Laterality Date  . CHOLECYSTECTOMY    . COLONOSCOPY WITH PROPOFOL N/A 09/07/2017   Procedure: COLONOSCOPY WITH PROPOFOL;  Surgeon: [name], MD;  Location:  [facility]OSCOPY;  Service: Endoscopy;  Laterality: N/A;  . open heart surgery    . ROOT CANAL     x2    Medications:  Current Outpatient Medications on File Prior to Visit  Medication Sig  . aspirin 81 MG tablet Take 81 mg by mouth daily.  Marland Kitchen losartan-hydrochlorothiazide (HYZAAR) 100-25 MG tablet Take 1 tablet by mouth daily.  Marland Kitchen PARoxetine (PAXIL) 40 MG tablet Take 0.5 tablets (20 mg total) by mouth daily. Take  1/2 dose   No current facility-administered medications on file prior to visit.     Allergies:  Allergies  Allergen Reactions  . Penicillins Anaphylaxis  . Biaxin [Clarithromycin] Other (See Comments)    GI issues  . Lisinopril Diarrhea    Social History:  Social History   Socioeconomic History  . Marital status: Married    Spouse name: Not on file  . Number of children: Not on file  . Years of education: Not on file  . Highest education level: High school graduate  Occupational History  . Occupation: retired   Scientific laboratory technician  . Financial resource strain: Not hard at all  . Food insecurity:    Worry: Never true    Inability: Never true  . Transportation needs:    Medical: No    Non-medical: No  Tobacco Use  . Smoking status: Never Smoker  . Smokeless tobacco: Never Used  Substance and Sexual Activity  . Alcohol use: No    Alcohol/week: 0.0 standard drinks  . Drug use: No  . Sexual activity: Yes  Lifestyle  . Physical activity:    Days per week: 0 days    Minutes per session: 0 min  . Stress: Not at all  Relationships  . Social connections:    Talks on phone: More than three times a week    Gets together: More than three times a week    Attends religious service: More than 4 times per year    Active member of club or organization: Yes    Attends meetings of clubs or organizations: More than 4 times per year    Relationship status: Married  . Intimate partner violence:    Fear of current or ex partner: No    Emotionally abused: No    Physically abused: No    Forced sexual activity: No  Other Topics Concern  . Not on file  Social History Narrative   Very active in church    Social History   Tobacco Use  Smoking Status Never Smoker  Smokeless Tobacco Never Used   Social History   Substance and Sexual Activity  Alcohol Use No  . Alcohol/week: 0.0 standard drinks    Family History:  Family History  Problem Relation Age of Onset  . Hypertension  Mother   . Dementia Father   . Hypertension Father   . Diabetes Father   . Depression Father   . GI problems Daughter   . Heart disease Maternal Grandfather   . Depression Sister     Past medical history, surgical history, medications, allergies, family history and social history reviewed with patient today and changes made to appropriate areas of the chart.   Review of Systems -  All other ROS negative except what is listed above and in the HPI.      Objective:    BP (!) 153/82   Pulse 70   Temp 98.5 F (36.9 C) (Oral)   Ht 5\' 5"  (1.651 m)   Wt 181  lb (82.1 kg)   LMP  (LMP Unknown)   SpO2 98%   BMI 30.12 kg/m   Wt Readings from Last 3 Encounters:  02/22/19 181 lb (82.1 kg)  02/13/19 184 lb 3.2 oz (83.6 kg)  02/11/18 179 lb 1.6 oz (81.2 kg)    Physical Exam Constitutional:      Appearance: She is well-developed.  HENT:     Head: Normocephalic and atraumatic.     Right Ear: Hearing, tympanic membrane, ear canal and external ear normal.     Left Ear: Hearing, tympanic membrane, ear canal and external ear normal.     Nose: Nose normal.     Right Sinus: No maxillary sinus tenderness or frontal sinus tenderness.     Left Sinus: No maxillary sinus tenderness or frontal sinus tenderness.  Eyes:     General:        Right eye: No discharge.        Left eye: No discharge.     Conjunctiva/sclera: Conjunctivae normal.     Pupils: Pupils are equal, round, and reactive to light.  Neck:     Musculoskeletal: Normal range of motion and neck supple.     Thyroid: No thyromegaly.     Vascular: No carotid bruit or JVD.  Cardiovascular:     Rate and Rhythm: Normal rate and regular rhythm.     Heart sounds: Normal heart sounds.  Pulmonary:     Effort: Pulmonary effort is normal.     Breath sounds: Normal breath sounds.  Chest:     Chest wall: No mass or tenderness.     Breasts: Breasts are symmetrical.        Right: Normal. No mass, skin change or tenderness.        Left:  Normal. No mass, skin change or tenderness.  Abdominal:     General: Bowel sounds are normal.     Palpations: Abdomen is soft. There is no hepatomegaly or splenomegaly.  Musculoskeletal: Normal range of motion.  Lymphadenopathy:     Cervical: No cervical adenopathy.  Skin:    General: Skin is warm and dry.     Capillary Refill: Capillary refill takes less than 2 seconds.       Neurological:     Mental Status: She is alert and oriented to person, place, and time.     Deep Tendon Reflexes: Reflexes are normal and symmetric.     Reflex Scores:      Brachioradialis reflexes are 2+ on the right side and 2+ on the left side.      Patellar reflexes are 2+ on the right side and 2+ on the left side. Psychiatric:        Mood and Affect: Mood normal.        Behavior: Behavior normal.        Thought Content: Thought content normal.        Cognition and Memory: Cognition and memory normal.     Results for orders placed or performed in visit on 02/11/18  Vitamin B12  Result Value Ref Range   Vitamin B-12 251 232 - 1,245 pg/mL  Comprehensive metabolic panel  Result Value Ref Range   Glucose 113 (H) 65 - 99 mg/dL   BUN 11 8 - 27 mg/dL   Creatinine, Ser 0.85 0.57 - 1.00 mg/dL   GFR calc non Af Amer 71 >59 mL/min/1.73   GFR calc Af Amer 82 >59 mL/min/1.73   BUN/Creatinine Ratio 13 12 - 28  Sodium 142 134 - 144 mmol/L   Potassium 5.4 (H) 3.5 - 5.2 mmol/L   Chloride 102 96 - 106 mmol/L   CO2 26 20 - 29 mmol/L   Calcium 9.9 8.7 - 10.3 mg/dL   Total Protein 7.3 6.0 - 8.5 g/dL   Albumin 4.1 3.6 - 4.8 g/dL   Globulin, Total 3.2 1.5 - 4.5 g/dL   Albumin/Globulin Ratio 1.3 1.2 - 2.2   Bilirubin Total 0.3 0.0 - 1.2 mg/dL   Alkaline Phosphatase 119 (H) 39 - 117 IU/L   AST 14 0 - 40 IU/L   ALT 13 0 - 32 IU/L      Assessment & Plan:   Problem List Items Addressed This Visit      Cardiovascular and Mediastinum   Hypertension    Chronic, elevated today 152/82. No medication changes today.  Recommend taking medication daily and monitor BP at home. Follow-up in 1 month.       Relevant Orders   CBC with Differential/Platelet   Comprehensive metabolic panel     Other   Hyperlipidemia    Labs today. Continue to focus on diet and exercise. Will assess need for change to plan of care based on lab results.      Relevant Orders   Lipid Panel w/o Chol/HDL Ratio   Major depression, chronic    Chronic, well controlled. Continue current medication regimen. Follow-up in 6 months.       B12 deficiency    Labs today. Continue oral B12 supplement. Will assess for need to change in plan of care based on lab results.       Relevant Orders   Vitamin B12   Prediabetes    Labs today. Focus on diet and activity/exercise. Will assess need for further interventions with lab results.       Relevant Orders   HgB A1c    Other Visit Diagnoses    Encounter for annual physical exam    -  Primary   Relevant Orders   TSH       Follow up plan: Return in about 1 month (around 03/25/2019) for HTN.   LABORATORY TESTING:  - Pap smear: up to date  IMMUNIZATIONS:   - Tdap: Tetanus vaccination status reviewed: last tetanus booster within 10 years. - Influenza: Refused - Pneumovax: Up to date - Prevnar: Up to date - HPV: Not applicable - Zostavax vaccine: Refused  SCREENING: -Mammogram: Ordered today  - Colonoscopy: Up to date  - Bone Density: Ordered today  -Hearing Test: Refused  -Spirometry: Not applicable   PATIENT COUNSELING:   Advised to take 1 mg of folate supplement per day if capable of pregnancy.   Sexuality: Discussed sexually transmitted diseases, partner selection, use of condoms, avoidance of unintended pregnancy  and contraceptive alternatives.   Advised to avoid cigarette smoking.  I discussed with the patient that most people either abstain from alcohol or drink within safe limits (<=14/week and <=4 drinks/occasion for males, <=7/weeks and <= 3 drinks/occasion  for females) and that the risk for alcohol disorders and other health effects rises proportionally with the number of drinks per week and how often a drinker exceeds daily limits.  Discussed cessation/primary prevention of drug use and availability of treatment for abuse.   Diet: Encouraged to adjust caloric intake to maintain  or achieve ideal body weight, to reduce intake of dietary saturated fat and total fat, to limit sodium intake by avoiding high sodium foods and not adding table salt, and  to maintain adequate dietary potassium and calcium preferably from fresh fruits, vegetables, and low-fat dairy products.    stressed the importance of regular exercise  Injury prevention: Discussed safety belts, safety helmets, smoke detector, smoking near bedding or upholstery.   Dental health: Discussed importance of regular tooth brushing, flossing, and dental visits.    NEXT PREVENTATIVE PHYSICAL DUE IN 1 YEAR. Return in about 1 month (around 03/25/2019) for HTN.   NOTE WRITTEN BY [employer] DNP STUDENT.  ASSESSMENT AND PLAN OF CARE REVIEWED WITH STUDENT, AGREE WITH ABOVE FINDINGS AND PLAN.

## 2019-02-22 NOTE — Assessment & Plan Note (Addendum)
Labs today. Continue oral B12 supplement. Will assess for need to change in plan of care based on lab results.

## 2019-02-22 NOTE — Patient Instructions (Signed)
Hypertension Hypertension, commonly called high blood pressure, is when the force of blood pumping through the arteries is too strong. The arteries are the blood vessels that carry blood from the heart throughout the body. Hypertension forces the heart to work harder to pump blood and may cause arteries to become narrow or stiff. Having untreated or uncontrolled hypertension can cause heart attacks, strokes, kidney disease, and other problems. A blood pressure reading consists of a higher number over a lower number. Ideally, your blood pressure should be below 120/80. The first ("top") number is called the systolic pressure. It is a measure of the pressure in your arteries as your heart beats. The second ("bottom") number is called the diastolic pressure. It is a measure of the pressure in your arteries as the heart relaxes. What are the causes? The cause of this condition is not known. What increases the risk? Some risk factors for high blood pressure are under your control. Others are not. Factors you can change  Smoking.  Having type 2 diabetes mellitus, high cholesterol, or both.  Not getting enough exercise or physical activity.  Being overweight.  Having too much fat, sugar, calories, or salt (sodium) in your diet.  Drinking too much alcohol. Factors that are difficult or impossible to change  Having chronic kidney disease.  Having a family history of high blood pressure.  Age. Risk increases with age.  Race. You may be at higher risk if you are African-American.  Gender. Men are at higher risk than women before age 45. After age 65, women are at higher risk than men.  Having obstructive sleep apnea.  Stress. What are the signs or symptoms? Extremely high blood pressure (hypertensive crisis) may cause:  Headache.  Anxiety.  Shortness of breath.  Nosebleed.  Nausea and vomiting.  Severe chest pain.  Jerky movements you cannot control (seizures). How is this  diagnosed? This condition is diagnosed by measuring your blood pressure while you are seated, with your arm resting on a surface. The cuff of the blood pressure monitor will be placed directly against the skin of your upper arm at the level of your heart. It should be measured at least twice using the same arm. Certain conditions can cause a difference in blood pressure between your right and left arms. Certain factors can cause blood pressure readings to be lower or higher than normal (elevated) for a short period of time:  When your blood pressure is higher when you are in a health care provider's office than when you are at home, this is called white coat hypertension. Most people with this condition do not need medicines.  When your blood pressure is higher at home than when you are in a health care provider's office, this is called masked hypertension. Most people with this condition may need medicines to control blood pressure. If you have a high blood pressure reading during one visit or you have normal blood pressure with other risk factors:  You may be asked to return on a different day to have your blood pressure checked again.  You may be asked to monitor your blood pressure at home for 1 week or longer. If you are diagnosed with hypertension, you may have other blood or imaging tests to help your health care provider understand your overall risk for other conditions. How is this treated? This condition is treated by making healthy lifestyle changes, such as eating healthy foods, exercising more, and reducing your alcohol intake. Your health care provider   may prescribe medicine if lifestyle changes are not enough to get your blood pressure under control, and if:  Your systolic blood pressure is above 130.  Your diastolic blood pressure is above 80. Your personal target blood pressure may vary depending on your medical conditions, your age, and other factors. Follow these instructions  at home: Eating and drinking   Eat a diet that is high in fiber and potassium, and low in sodium, added sugar, and fat. An example eating plan is called the DASH (Dietary Approaches to Stop Hypertension) diet. To eat this way: ? Eat plenty of fresh fruits and vegetables. Try to fill half of your plate at each meal with fruits and vegetables. ? Eat whole grains, such as whole wheat pasta, brown rice, or whole grain bread. Fill about one quarter of your plate with whole grains. ? Eat or drink low-fat dairy products, such as skim milk or low-fat yogurt. ? Avoid fatty cuts of meat, processed or cured meats, and poultry with skin. Fill about one quarter of your plate with lean proteins, such as fish, chicken without skin, beans, eggs, and tofu. ? Avoid premade and processed foods. These tend to be higher in sodium, added sugar, and fat.  Reduce your daily sodium intake. Most people with hypertension should eat less than 1,500 mg of sodium a day.  Limit alcohol intake to no more than 1 drink a day for nonpregnant women and 2 drinks a day for men. One drink equals 12 oz of beer, 5 oz of wine, or 1 oz of hard liquor. Lifestyle   Work with your health care provider to maintain a healthy body weight or to lose weight. Ask what an ideal weight is for you.  Get at least 30 minutes of exercise that causes your heart to beat faster (aerobic exercise) most days of the week. Activities may include walking, swimming, or biking.  Include exercise to strengthen your muscles (resistance exercise), such as pilates or lifting weights, as part of your weekly exercise routine. Try to do these types of exercises for 30 minutes at least 3 days a week.  Do not use any products that contain nicotine or tobacco, such as cigarettes and e-cigarettes. If you need help quitting, ask your health care provider.  Monitor your blood pressure at home as told by your health care provider.  Keep all follow-up visits as told by  your health care provider. This is important. Medicines  Take over-the-counter and prescription medicines only as told by your health care provider. Follow directions carefully. Blood pressure medicines must be taken as prescribed.  Do not skip doses of blood pressure medicine. Doing this puts you at risk for problems and can make the medicine less effective.  Ask your health care provider about side effects or reactions to medicines that you should watch for. Contact a health care provider if:  You think you are having a reaction to a medicine you are taking.  You have headaches that keep coming back (recurring).  You feel dizzy.  You have swelling in your ankles.  You have trouble with your vision. Get help right away if:  You develop a severe headache or confusion.  You have unusual weakness or numbness.  You feel faint.  You have severe pain in your chest or abdomen.  You vomit repeatedly.  You have trouble breathing. Summary  Hypertension is when the force of blood pumping through your arteries is too strong. If this condition is not controlled, it   may put you at risk for serious complications.  Your personal target blood pressure may vary depending on your medical conditions, your age, and other factors. For most people, a normal blood pressure is less than 120/80.  Hypertension is treated with lifestyle changes, medicines, or a combination of both. Lifestyle changes include weight loss, eating a healthy, low-sodium diet, exercising more, and limiting alcohol. This information is not intended to replace advice given to you by your health care provider. Make sure you discuss any questions you have with your health care provider. Document Released: 12/07/2005 Document Revised: 11/04/2016 Document Reviewed: 11/04/2016 Elsevier Interactive Patient Education  2019 [employer] DASH Eating Plan DASH stands for "Dietary Approaches to Stop Hypertension." The DASH eating  plan is a healthy eating plan that has been shown to reduce high blood pressure (hypertension). It may also reduce your risk for type 2 diabetes, heart disease, and stroke. The DASH eating plan may also help with weight loss. What are tips for following this plan?  General guidelines  Avoid eating more than 2,300 mg (milligrams) of salt (sodium) a day. If you have hypertension, you may need to reduce your sodium intake to 1,500 mg a day.  Limit alcohol intake to no more than 1 drink a day for nonpregnant women and 2 drinks a day for men. One drink equals 12 oz of beer, 5 oz of wine, or 1 oz of hard liquor.  Work with your health care provider to maintain a healthy body weight or to lose weight. Ask what an ideal weight is for you.  Get at least 30 minutes of exercise that causes your heart to beat faster (aerobic exercise) most days of the week. Activities may include walking, swimming, or biking.  Work with your health care provider or diet and nutrition specialist (dietitian) to adjust your eating plan to your individual calorie needs. Reading food labels   Check food labels for the amount of sodium per serving. Choose foods with less than 5 percent of the Daily Value of sodium. Generally, foods with less than 300 mg of sodium per serving fit into this eating plan.  To find whole grains, look for the word "whole" as the first word in the ingredient list. Shopping  Buy products labeled as "low-sodium" or "no salt added."  Buy fresh foods. Avoid canned foods and premade or frozen meals. Cooking  Avoid adding salt when cooking. Use salt-free seasonings or herbs instead of table salt or sea salt. Check with your health care provider or pharmacist before using salt substitutes.  Do not fry foods. Cook foods using healthy methods such as baking, boiling, grilling, and broiling instead.  Cook with heart-healthy oils, such as olive, canola, soybean, or sunflower oil. Meal planning  Eat a  balanced diet that includes: ? 5 or more servings of fruits and vegetables each day. At each meal, try to fill half of your plate with fruits and vegetables. ? Up to 6-8 servings of whole grains each day. ? Less than 6 oz of lean meat, poultry, or fish each day. A 3-oz serving of meat is about the same size as a deck of cards. One egg equals 1 oz. ? 2 servings of low-fat dairy each day. ? A serving of nuts, seeds, or beans 5 times each week. ? Heart-healthy fats. Healthy fats called Omega-3 fatty acids are found in foods such as flaxseeds and coldwater fish, like sardines, salmon, and mackerel.  Limit how much you eat of the following: ?   Canned or prepackaged foods. ? Food that is high in trans fat, such as fried foods. ? Food that is high in saturated fat, such as fatty meat. ? Sweets, desserts, sugary drinks, and other foods with added sugar. ? Full-fat dairy products.  Do not salt foods before eating.  Try to eat at least 2 vegetarian meals each week.  Eat more home-cooked food and less restaurant, buffet, and fast food.  When eating at a restaurant, ask that your food be prepared with less salt or no salt, if possible. What foods are recommended? The items listed may not be a complete list. Talk with your dietitian about what dietary choices are best for you. Grains Whole-grain or whole-wheat bread. Whole-grain or whole-wheat pasta. Brown rice. Oatmeal. Quinoa. Bulgur. Whole-grain and low-sodium cereals. Pita bread. Low-fat, low-sodium crackers. Whole-wheat flour tortillas. Vegetables Fresh or frozen vegetables (raw, steamed, roasted, or grilled). Low-sodium or reduced-sodium tomato and vegetable juice. Low-sodium or reduced-sodium tomato sauce and tomato paste. Low-sodium or reduced-sodium canned vegetables. Fruits All fresh, dried, or frozen fruit. Canned fruit in natural juice (without added sugar). Meat and other protein foods Skinless chicken or turkey. Ground chicken or  turkey. Pork with fat trimmed off. Fish and seafood. Egg whites. Dried beans, peas, or lentils. Unsalted nuts, nut butters, and seeds. Unsalted canned beans. Lean cuts of beef with fat trimmed off. Low-sodium, lean deli meat. Dairy Low-fat (1%) or fat-free (skim) milk. Fat-free, low-fat, or reduced-fat cheeses. Nonfat, low-sodium ricotta or cottage cheese. Low-fat or nonfat yogurt. Low-fat, low-sodium cheese. Fats and oils Soft margarine without trans fats. Vegetable oil. Low-fat, reduced-fat, or light mayonnaise and salad dressings (reduced-sodium). Canola, safflower, olive, soybean, and sunflower oils. Avocado. Seasoning and other foods Herbs. Spices. Seasoning mixes without salt. Unsalted popcorn and pretzels. Fat-free sweets. What foods are not recommended? The items listed may not be a complete list. Talk with your dietitian about what dietary choices are best for you. Grains Baked goods made with fat, such as croissants, muffins, or some breads. Dry pasta or rice meal packs. Vegetables Creamed or fried vegetables. Vegetables in a cheese sauce. Regular canned vegetables (not low-sodium or reduced-sodium). Regular canned tomato sauce and paste (not low-sodium or reduced-sodium). Regular tomato and vegetable juice (not low-sodium or reduced-sodium). Pickles. Olives. Fruits Canned fruit in a light or heavy syrup. Fried fruit. Fruit in cream or butter sauce. Meat and other protein foods Fatty cuts of meat. Ribs. Fried meat. Bacon. Sausage. Bologna and other processed lunch meats. Salami. Fatback. Hotdogs. Bratwurst. Salted nuts and seeds. Canned beans with added salt. Canned or smoked fish. Whole eggs or egg yolks. Chicken or turkey with skin. Dairy Whole or 2% milk, cream, and half-and-half. Whole or full-fat cream cheese. Whole-fat or sweetened yogurt. Full-fat cheese. Nondairy creamers. Whipped toppings. Processed cheese and cheese spreads. Fats and oils Butter. Stick margarine. Lard.  Shortening. Ghee. Bacon fat. Tropical oils, such as coconut, palm kernel, or palm oil. Seasoning and other foods Salted popcorn and pretzels. Onion salt, garlic salt, seasoned salt, table salt, and sea salt. Worcestershire sauce. Tartar sauce. Barbecue sauce. Teriyaki sauce. Soy sauce, including reduced-sodium. Steak sauce. Canned and packaged gravies. Fish sauce. Oyster sauce. Cocktail sauce. Horseradish that you find on the shelf. Ketchup. Mustard. Meat flavorings and tenderizers. Bouillon cubes. Hot sauce and Tabasco sauce. Premade or packaged marinades. Premade or packaged taco seasonings. Relishes. Regular salad dressings. Where to find more information:  National Heart, Lung, and Blood Institute: www.nhlbi.nih.gov  American Heart Association: www.heart.org Summary    The DASH eating plan is a healthy eating plan that has been shown to reduce high blood pressure (hypertension). It may also reduce your risk for type 2 diabetes, heart disease, and stroke.  With the DASH eating plan, you should limit salt (sodium) intake to 2,300 mg a day. If you have hypertension, you may need to reduce your sodium intake to 1,500 mg a day.  When on the DASH eating plan, aim to eat more fresh fruits and vegetables, whole grains, lean proteins, low-fat dairy, and heart-healthy fats.  Work with your health care provider or diet and nutrition specialist (dietitian) to adjust your eating plan to your individual calorie needs. This information is not intended to replace advice given to you by your health care provider. Make sure you discuss any questions you have with your health care provider. Document Released: 11/26/2011 Document Revised: 11/30/2016 Document Reviewed: 11/30/2016 Elsevier Interactive Patient Education  2019 [employer]  

## 2019-02-22 NOTE — Assessment & Plan Note (Addendum)
Labs today. Continue to focus on diet and exercise. Will assess need for change to plan of care based on lab results.

## 2019-02-22 NOTE — Telephone Encounter (Signed)
I am doing it:)

## 2019-02-22 NOTE — Assessment & Plan Note (Addendum)
Chronic, well controlled. Continue current medication regimen. Follow-up in 6 months.

## 2019-02-23 ENCOUNTER — Encounter: Payer: Self-pay | Admitting: Nurse Practitioner

## 2019-02-23 DIAGNOSIS — R7989 Other specified abnormal findings of blood chemistry: Secondary | ICD-10-CM | POA: Insufficient documentation

## 2019-02-23 LAB — CBC WITH DIFFERENTIAL/PLATELET
BASOS: 1 %
Basophils Absolute: 0.1 10*3/uL (ref 0.0–0.2)
EOS (ABSOLUTE): 0.3 10*3/uL (ref 0.0–0.4)
EOS: 4 %
HEMATOCRIT: 39.1 % (ref 34.0–46.6)
Hemoglobin: 13.3 g/dL (ref 11.1–15.9)
IMMATURE GRANULOCYTES: 1 %
Immature Grans (Abs): 0.1 10*3/uL (ref 0.0–0.1)
LYMPHS ABS: 1.9 10*3/uL (ref 0.7–3.1)
Lymphs: 23 %
MCH: 28.4 pg (ref 26.6–33.0)
MCHC: 34 g/dL (ref 31.5–35.7)
MCV: 84 fL (ref 79–97)
MONOS ABS: 0.5 10*3/uL (ref 0.1–0.9)
Monocytes: 7 %
NEUTROS PCT: 64 %
Neutrophils Absolute: 5.5 10*3/uL (ref 1.4–7.0)
Platelets: 226 10*3/uL (ref 150–450)
RBC: 4.68 x10E6/uL (ref 3.77–5.28)
RDW: 13.1 % (ref 11.7–15.4)
WBC: 8.3 10*3/uL (ref 3.4–10.8)

## 2019-02-23 LAB — COMPREHENSIVE METABOLIC PANEL
ALT: 19 IU/L (ref 0–32)
AST: 21 IU/L (ref 0–40)
Albumin/Globulin Ratio: 1.4 (ref 1.2–2.2)
Albumin: 4.3 g/dL (ref 3.8–4.8)
Alkaline Phosphatase: 125 IU/L — ABNORMAL HIGH (ref 39–117)
BUN/Creatinine Ratio: 10 — ABNORMAL LOW (ref 12–28)
BUN: 10 mg/dL (ref 8–27)
Bilirubin Total: 0.5 mg/dL (ref 0.0–1.2)
CALCIUM: 9.8 mg/dL (ref 8.7–10.3)
CO2: 23 mmol/L (ref 20–29)
Chloride: 102 mmol/L (ref 96–106)
Creatinine, Ser: 1.03 mg/dL — ABNORMAL HIGH (ref 0.57–1.00)
GFR calc Af Amer: 65 mL/min/{1.73_m2} (ref >59)
GFR, EST NON AFRICAN AMERICAN: 56 mL/min/{1.73_m2} — AB (ref >59)
GLOBULIN, TOTAL: 3 g/dL (ref 1.5–4.5)
Glucose: 108 mg/dL — ABNORMAL HIGH (ref 65–99)
Potassium: 4.7 mmol/L (ref 3.5–5.2)
SODIUM: 140 mmol/L (ref 134–144)
Total Protein: 7.3 g/dL (ref 6.0–8.5)

## 2019-02-23 LAB — HEMOGLOBIN A1C
Est. average glucose Bld gHb Est-mCnc: 123 mg/dL
Hgb A1c MFr Bld: 5.9 % — ABNORMAL HIGH (ref 4.8–5.6)

## 2019-02-23 LAB — LIPID PANEL W/O CHOL/HDL RATIO
Cholesterol, Total: 226 mg/dL — ABNORMAL HIGH (ref 100–199)
HDL: 50 mg/dL (ref >39)
LDL Calculated: 144 mg/dL — ABNORMAL HIGH (ref 0–99)
Triglycerides: 161 mg/dL — ABNORMAL HIGH (ref 0–149)
VLDL CHOLESTEROL CAL: 32 mg/dL (ref 5–40)

## 2019-02-23 LAB — TSH: TSH: 4.62 u[IU]/mL — ABNORMAL HIGH (ref 0.450–4.500)

## 2019-02-23 LAB — VITAMIN B12: Vitamin B-12: 294 pg/mL (ref 232–1245)

## 2019-02-23 NOTE — Telephone Encounter (Signed)
Thank you :)

## 2019-03-23 ENCOUNTER — Ambulatory Visit (INDEPENDENT_AMBULATORY_CARE_PROVIDER_SITE_OTHER): Payer: Medicare Other | Admitting: Nurse Practitioner

## 2019-03-23 ENCOUNTER — Other Ambulatory Visit: Payer: Self-pay

## 2019-03-23 ENCOUNTER — Encounter: Payer: Self-pay | Admitting: Nurse Practitioner

## 2019-03-23 DIAGNOSIS — I10 Essential (primary) hypertension: Secondary | ICD-10-CM | POA: Diagnosis not present

## 2019-03-23 MED ORDER — AMLODIPINE BESYLATE 2.5 MG PO TABS: 2.5000 mg | ORAL_TABLET | Freq: Every day | ORAL | 3 refills | Status: DC

## 2019-03-23 NOTE — Patient Instructions (Signed)
DASH Eating Plan  DASH stands for "Dietary Approaches to Stop Hypertension." The DASH eating plan is a healthy eating plan that has been shown to reduce high blood pressure (hypertension). It may also reduce your risk for type 2 diabetes, heart disease, and stroke. The DASH eating plan may also help with weight loss.  What are tips for following this plan?    General guidelines   Avoid eating more than 2,300 mg (milligrams) of salt (sodium) a day. If you have hypertension, you may need to reduce your sodium intake to 1,500 mg a day.   Limit alcohol intake to no more than 1 drink a day for nonpregnant women and 2 drinks a day for men. One drink equals 12 oz of beer, 5 oz of wine, or 1 oz of hard liquor.   Work with your health care provider to maintain a healthy body weight or to lose weight. Ask what an ideal weight is for you.   Get at least 30 minutes of exercise that causes your heart to beat faster (aerobic exercise) most days of the week. Activities may include walking, swimming, or biking.   Work with your health care provider or diet and nutrition specialist (dietitian) to adjust your eating plan to your individual calorie needs.  Reading food labels     Check food labels for the amount of sodium per serving. Choose foods with less than 5 percent of the Daily Value of sodium. Generally, foods with less than 300 mg of sodium per serving fit into this eating plan.   To find whole grains, look for the word "whole" as the first word in the ingredient list.  Shopping   Buy products labeled as "low-sodium" or "no salt added."   Buy fresh foods. Avoid canned foods and premade or frozen meals.  Cooking   Avoid adding salt when cooking. Use salt-free seasonings or herbs instead of table salt or sea salt. Check with your health care provider or pharmacist before using salt substitutes.   Do not fry foods. Cook foods using healthy methods such as baking, boiling, grilling, and broiling instead.   Cook with  heart-healthy oils, such as olive, canola, soybean, or sunflower oil.  Meal planning   Eat a balanced diet that includes:  ? 5 or more servings of fruits and vegetables each day. At each meal, try to fill half of your plate with fruits and vegetables.  ? Up to 6-8 servings of whole grains each day.  ? Less than 6 oz of lean meat, poultry, or fish each day. A 3-oz serving of meat is about the same size as a deck of cards. One egg equals 1 oz.  ? 2 servings of low-fat dairy each day.  ? A serving of nuts, seeds, or beans 5 times each week.  ? Heart-healthy fats. Healthy fats called Omega-3 fatty acids are found in foods such as flaxseeds and coldwater fish, like sardines, salmon, and mackerel.   Limit how much you eat of the following:  ? Canned or prepackaged foods.  ? Food that is high in trans fat, such as fried foods.  ? Food that is high in saturated fat, such as fatty meat.  ? Sweets, desserts, sugary drinks, and other foods with added sugar.  ? Full-fat dairy products.   Do not salt foods before eating.   Try to eat at least 2 vegetarian meals each week.   Eat more home-cooked food and less restaurant, buffet, and fast food.     When eating at a restaurant, ask that your food be prepared with less salt or no salt, if possible.  What foods are recommended?  The items listed may not be a complete list. Talk with your dietitian about what dietary choices are best for you.  Grains  Whole-grain or whole-wheat bread. Whole-grain or whole-wheat pasta. Brown rice. Oatmeal. Quinoa. Bulgur. Whole-grain and low-sodium cereals. Pita bread. Low-fat, low-sodium crackers. Whole-wheat flour tortillas.  Vegetables  Fresh or frozen vegetables (raw, steamed, roasted, or grilled). Low-sodium or reduced-sodium tomato and vegetable juice. Low-sodium or reduced-sodium tomato sauce and tomato paste. Low-sodium or reduced-sodium canned vegetables.  Fruits  All fresh, dried, or frozen fruit. Canned fruit in natural juice (without  added sugar).  Meat and other protein foods  Skinless chicken or turkey. Ground chicken or turkey. Pork with fat trimmed off. Fish and seafood. Egg whites. Dried beans, peas, or lentils. Unsalted nuts, nut butters, and seeds. Unsalted canned beans. Lean cuts of beef with fat trimmed off. Low-sodium, lean deli meat.  Dairy  Low-fat (1%) or fat-free (skim) milk. Fat-free, low-fat, or reduced-fat cheeses. Nonfat, low-sodium ricotta or cottage cheese. Low-fat or nonfat yogurt. Low-fat, low-sodium cheese.  Fats and oils  Soft margarine without trans fats. Vegetable oil. Low-fat, reduced-fat, or light mayonnaise and salad dressings (reduced-sodium). Canola, safflower, olive, soybean, and sunflower oils. Avocado.  Seasoning and other foods  Herbs. Spices. Seasoning mixes without salt. Unsalted popcorn and pretzels. Fat-free sweets.  What foods are not recommended?  The items listed may not be a complete list. Talk with your dietitian about what dietary choices are best for you.  Grains  Baked goods made with fat, such as croissants, muffins, or some breads. Dry pasta or rice meal packs.  Vegetables  Creamed or fried vegetables. Vegetables in a cheese sauce. Regular canned vegetables (not low-sodium or reduced-sodium). Regular canned tomato sauce and paste (not low-sodium or reduced-sodium). Regular tomato and vegetable juice (not low-sodium or reduced-sodium). Pickles. Olives.  Fruits  Canned fruit in a light or heavy syrup. Fried fruit. Fruit in cream or butter sauce.  Meat and other protein foods  Fatty cuts of meat. Ribs. Fried meat. Bacon. Sausage. Bologna and other processed lunch meats. Salami. Fatback. Hotdogs. Bratwurst. Salted nuts and seeds. Canned beans with added salt. Canned or smoked fish. Whole eggs or egg yolks. Chicken or turkey with skin.  Dairy  Whole or 2% milk, cream, and half-and-half. Whole or full-fat cream cheese. Whole-fat or sweetened yogurt. Full-fat cheese. Nondairy creamers. Whipped toppings.  Processed cheese and cheese spreads.  Fats and oils  Butter. Stick margarine. Lard. Shortening. Ghee. Bacon fat. Tropical oils, such as coconut, palm kernel, or palm oil.  Seasoning and other foods  Salted popcorn and pretzels. Onion salt, garlic salt, seasoned salt, table salt, and sea salt. Worcestershire sauce. Tartar sauce. Barbecue sauce. Teriyaki sauce. Soy sauce, including reduced-sodium. Steak sauce. Canned and packaged gravies. Fish sauce. Oyster sauce. Cocktail sauce. Horseradish that you find on the shelf. Ketchup. Mustard. Meat flavorings and tenderizers. Bouillon cubes. Hot sauce and Tabasco sauce. Premade or packaged marinades. Premade or packaged taco seasonings. Relishes. Regular salad dressings.  Where to find more information:   National Heart, Lung, and Blood Institute: www.nhlbi.nih.gov   American Heart Association: www.heart.org  Summary   The DASH eating plan is a healthy eating plan that has been shown to reduce high blood pressure (hypertension). It may also reduce your risk for type 2 diabetes, heart disease, and stroke.   With the   DASH eating plan, you should limit salt (sodium) intake to 2,300 mg a day. If you have hypertension, you may need to reduce your sodium intake to 1,500 mg a day.   When on the DASH eating plan, aim to eat more fresh fruits and vegetables, whole grains, lean proteins, low-fat dairy, and heart-healthy fats.   Work with your health care provider or diet and nutrition specialist (dietitian) to adjust your eating plan to your individual calorie needs.  This information is not intended to replace advice given to you by your health care provider. Make sure you discuss any questions you have with your health care provider.  Document Released: 11/26/2011 Document Revised: 11/30/2016 Document Reviewed: 11/30/2016  [employer]vier Interactive Patient Education  2019 [employer]

## 2019-03-23 NOTE — Progress Notes (Signed)
BP (!) 149/78   Pulse 74   Ht 5\' 5"  (1.651 m)   Wt 181 lb (82.1 kg)   LMP  (LMP Unknown)   BMI 30.12 kg/m    Subjective:    Patient ID: Lisa Bass, female    DOB: September 15, 1951, 68 y.o.   MRN: [id number]  HPI: Lisa Bass is a 68 y.o. female  Chief Complaint  Patient presents with  . Follow-up  . Hypertension    . This visit was completed via telephone due to the restrictions of the COVID-19 pandemic. All issues as above were discussed and addressed but no physical exam was performed. If it was felt that the patient should be evaluated in the office, they were directed there. The patient verbally consented to this visit. Patient was unable to complete an audio/visual visit due to Technical difficulties,Lack of internet. Due to the catastrophic nature of the COVID-19 pandemic, this visit was done through audio contact only. . Location of the patient: home . Location of the provider: home . Those involved with this call:  . Provider: [name], DNP, AGPCNP-C . CMA: Gerda Diss, CMA . Front Desk/Registration: Don Perking  . Time spent on call: 15 minutes on the phone discussing health concerns   HYPERTENSION Follow-up for BP today.  At last visit, physical, her BP had been elevated above goal and she informed provider she had been taking her Losartan-HCTZ only once per week.  We had discussed at length need to take this daily for BP control.  Currently taking medicine every day, she read name on bottle and dose, which matches what is on medication list. Hypertension status: stable  Satisfied with current treatment? yes Duration of hypertension: chronic BP monitoring frequency:  rarely BP range: 140/80's when she takes it at home BP medication side effects:  no Medication compliance: fair compliance Aspirin: yes Recurrent headaches: no Visual changes: no Palpitations: no Dyspnea: no Chest pain: no Lower extremity edema: no Dizzy/lightheaded: no  Relevant  past medical, surgical, family and social history reviewed and updated as indicated. Interim medical history since our last visit reviewed. Allergies and medications reviewed and updated.  Review of Systems  Constitutional: Negative for activity change, appetite change, diaphoresis, fatigue and fever.  Respiratory: Negative for cough, chest tightness and shortness of breath.   Cardiovascular: Negative for chest pain, palpitations and leg swelling.  Gastrointestinal: Negative for abdominal distention, abdominal pain, constipation, diarrhea, nausea and vomiting.  Endocrine: Negative for cold intolerance, heat intolerance, polydipsia, polyphagia and polyuria.  Neurological: Negative for dizziness, syncope, weakness, light-headedness, numbness and headaches.  Psychiatric/Behavioral: Negative.     Per HPI unless specifically indicated above     Objective:    BP (!) 149/78   Pulse 74   Ht 5\' 5"  (1.651 m)   Wt 181 lb (82.1 kg)   LMP  (LMP Unknown)   BMI 30.12 kg/m   Wt Readings from Last 3 Encounters:  03/23/19 181 lb (82.1 kg)  02/22/19 181 lb (82.1 kg)  02/13/19 184 lb 3.2 oz (83.6 kg)    Physical Exam   No physical exam performed due to telephone visit only.  Patient with technical difficulties via video.  Results for orders placed or performed in visit on 02/22/19  CBC with Differential/Platelet  Result Value Ref Range   WBC 8.3 3.4 - 10.8 x10E3/uL   RBC 4.68 3.77 - 5.28 x10E6/uL   Hemoglobin 13.3 11.1 - 15.9 g/dL   Hematocrit 39.1 34.0 - 46.6 %  MCV 84 79 - 97 fL   MCH 28.4 26.6 - 33.0 pg   MCHC 34.0 31.5 - 35.7 g/dL   RDW 13.1 11.7 - 15.4 %   Platelets 226 150 - 450 x10E3/uL   Neutrophils 64 Not Estab. %   Lymphs 23 Not Estab. %   Monocytes 7 Not Estab. %   Eos 4 Not Estab. %   Basos 1 Not Estab. %   Neutrophils Absolute 5.5 1.4 - 7.0 x10E3/uL   Lymphocytes Absolute 1.9 0.7 - 3.1 x10E3/uL   Monocytes Absolute 0.5 0.1 - 0.9 x10E3/uL   EOS (ABSOLUTE) 0.3 0.0 - 0.4  x10E3/uL   Basophils Absolute 0.1 0.0 - 0.2 x10E3/uL   Immature Granulocytes 1 Not Estab. %   Immature Grans (Abs) 0.1 0.0 - 0.1 x10E3/uL  Comprehensive metabolic panel  Result Value Ref Range   Glucose 108 (H) 65 - 99 mg/dL   BUN 10 8 - 27 mg/dL   Creatinine, Ser 1.03 (H) 0.57 - 1.00 mg/dL   GFR calc non Af Amer 56 (L) >59 mL/min/1.73   GFR calc Af Amer 65 >59 mL/min/1.73   BUN/Creatinine Ratio 10 (L) 12 - 28   Sodium 140 134 - 144 mmol/L   Potassium 4.7 3.5 - 5.2 mmol/L   Chloride 102 96 - 106 mmol/L   CO2 23 20 - 29 mmol/L   Calcium 9.8 8.7 - 10.3 mg/dL   Total Protein 7.3 6.0 - 8.5 g/dL   Albumin 4.3 3.8 - 4.8 g/dL   Globulin, Total 3.0 1.5 - 4.5 g/dL   Albumin/Globulin Ratio 1.4 1.2 - 2.2   Bilirubin Total 0.5 0.0 - 1.2 mg/dL   Alkaline Phosphatase 125 (H) 39 - 117 IU/L   AST 21 0 - 40 IU/L   ALT 19 0 - 32 IU/L  Lipid Panel w/o Chol/HDL Ratio  Result Value Ref Range   Cholesterol, Total 226 (H) 100 - 199 mg/dL   Triglycerides 161 (H) 0 - 149 mg/dL   HDL 50 >39 mg/dL   VLDL Cholesterol Cal 32 5 - 40 mg/dL   LDL Calculated 144 (H) 0 - 99 mg/dL  TSH  Result Value Ref Range   TSH 4.620 (H) 0.450 - 4.500 uIU/mL  Vitamin B12  Result Value Ref Range   Vitamin B-12 294 232 - 1,245 pg/mL  HgB A1c  Result Value Ref Range   Hgb A1c MFr Bld 5.9 (H) 4.8 - 5.6 %   Est. average glucose Bld gHb Est-mCnc 123 mg/dL      Assessment & Plan:   Problem List Items Addressed This Visit      Cardiovascular and Mediastinum   Hypertension    Chronic, ongoing.  Continues to have BP readings at home above goal.  Reports taking medication daily at this time.  Will add on Amlodipine 2.5 MG daily, discussed with patient and educated on side effects plus medication use.  Recommend monitoring BP daily in morning and writing these down for provider to review.  Return in 4 weeks for BP follow-up and labs.      Relevant Medications   amLODipine (NORVASC) 2.5 MG tablet       Follow up plan:  Return in about 4 weeks (around 04/20/2019) for BP check.

## 2019-03-23 NOTE — Assessment & Plan Note (Signed)
Chronic, ongoing.  Continues to have BP readings at home above goal.  Reports taking medication daily at this time.  Will add on Amlodipine 2.5 MG daily, discussed with patient and educated on side effects plus medication use.  Recommend monitoring BP daily in morning and writing these down for provider to review.  Return in 4 weeks for BP follow-up and labs.

## 2019-04-25 ENCOUNTER — Ambulatory Visit: Payer: Medicare Other | Admitting: Nurse Practitioner

## 2019-04-25 ENCOUNTER — Encounter: Payer: Self-pay | Admitting: Nurse Practitioner

## 2019-04-25 ENCOUNTER — Other Ambulatory Visit: Payer: Self-pay

## 2019-04-25 ENCOUNTER — Ambulatory Visit (INDEPENDENT_AMBULATORY_CARE_PROVIDER_SITE_OTHER): Payer: Medicare Other | Admitting: Nurse Practitioner

## 2019-04-25 VITALS — BP 155/74 | HR 75 | Ht 65.0 in | Wt 186.0 lb

## 2019-04-25 DIAGNOSIS — I10 Essential (primary) hypertension: Secondary | ICD-10-CM | POA: Diagnosis not present

## 2019-04-25 DIAGNOSIS — R7989 Other specified abnormal findings of blood chemistry: Secondary | ICD-10-CM

## 2019-04-25 DIAGNOSIS — E782 Mixed hyperlipidemia: Secondary | ICD-10-CM

## 2019-04-25 MED ORDER — AMLODIPINE BESYLATE 5 MG PO TABS: 5.0000 mg | ORAL_TABLET | Freq: Every day | ORAL | 3 refills | Status: DC

## 2019-04-25 NOTE — Patient Instructions (Signed)
DASH Eating Plan  DASH stands for "Dietary Approaches to Stop Hypertension." The DASH eating plan is a healthy eating plan that has been shown to reduce high blood pressure (hypertension). It may also reduce your risk for type 2 diabetes, heart disease, and stroke. The DASH eating plan may also help with weight loss.  What are tips for following this plan?    General guidelines   Avoid eating more than 2,300 mg (milligrams) of salt (sodium) a day. If you have hypertension, you may need to reduce your sodium intake to 1,500 mg a day.   Limit alcohol intake to no more than 1 drink a day for nonpregnant women and 2 drinks a day for men. One drink equals 12 oz of beer, 5 oz of wine, or 1 oz of hard liquor.   Work with your health care provider to maintain a healthy body weight or to lose weight. Ask what an ideal weight is for you.   Get at least 30 minutes of exercise that causes your heart to beat faster (aerobic exercise) most days of the week. Activities may include walking, swimming, or biking.   Work with your health care provider or diet and nutrition specialist (dietitian) to adjust your eating plan to your individual calorie needs.  Reading food labels     Check food labels for the amount of sodium per serving. Choose foods with less than 5 percent of the Daily Value of sodium. Generally, foods with less than 300 mg of sodium per serving fit into this eating plan.   To find whole grains, look for the word "whole" as the first word in the ingredient list.  Shopping   Buy products labeled as "low-sodium" or "no salt added."   Buy fresh foods. Avoid canned foods and premade or frozen meals.  Cooking   Avoid adding salt when cooking. Use salt-free seasonings or herbs instead of table salt or sea salt. Check with your health care provider or pharmacist before using salt substitutes.   Do not fry foods. Cook foods using healthy methods such as baking, boiling, grilling, and broiling instead.   Cook with  heart-healthy oils, such as olive, canola, soybean, or sunflower oil.  Meal planning   Eat a balanced diet that includes:  ? 5 or more servings of fruits and vegetables each day. At each meal, try to fill half of your plate with fruits and vegetables.  ? Up to 6-8 servings of whole grains each day.  ? Less than 6 oz of lean meat, poultry, or fish each day. A 3-oz serving of meat is about the same size as a deck of cards. One egg equals 1 oz.  ? 2 servings of low-fat dairy each day.  ? A serving of nuts, seeds, or beans 5 times each week.  ? Heart-healthy fats. Healthy fats called Omega-3 fatty acids are found in foods such as flaxseeds and coldwater fish, like sardines, salmon, and mackerel.   Limit how much you eat of the following:  ? Canned or prepackaged foods.  ? Food that is high in trans fat, such as fried foods.  ? Food that is high in saturated fat, such as fatty meat.  ? Sweets, desserts, sugary drinks, and other foods with added sugar.  ? Full-fat dairy products.   Do not salt foods before eating.   Try to eat at least 2 vegetarian meals each week.   Eat more home-cooked food and less restaurant, buffet, and fast food.     When eating at a restaurant, ask that your food be prepared with less salt or no salt, if possible.  What foods are recommended?  The items listed may not be a complete list. Talk with your dietitian about what dietary choices are best for you.  Grains  Whole-grain or whole-wheat bread. Whole-grain or whole-wheat pasta. Brown rice. Oatmeal. Quinoa. Bulgur. Whole-grain and low-sodium cereals. Pita bread. Low-fat, low-sodium crackers. Whole-wheat flour tortillas.  Vegetables  Fresh or frozen vegetables (raw, steamed, roasted, or grilled). Low-sodium or reduced-sodium tomato and vegetable juice. Low-sodium or reduced-sodium tomato sauce and tomato paste. Low-sodium or reduced-sodium canned vegetables.  Fruits  All fresh, dried, or frozen fruit. Canned fruit in natural juice (without  added sugar).  Meat and other protein foods  Skinless chicken or turkey. Ground chicken or turkey. Pork with fat trimmed off. Fish and seafood. Egg whites. Dried beans, peas, or lentils. Unsalted nuts, nut butters, and seeds. Unsalted canned beans. Lean cuts of beef with fat trimmed off. Low-sodium, lean deli meat.  Dairy  Low-fat (1%) or fat-free (skim) milk. Fat-free, low-fat, or reduced-fat cheeses. Nonfat, low-sodium ricotta or cottage cheese. Low-fat or nonfat yogurt. Low-fat, low-sodium cheese.  Fats and oils  Soft margarine without trans fats. Vegetable oil. Low-fat, reduced-fat, or light mayonnaise and salad dressings (reduced-sodium). Canola, safflower, olive, soybean, and sunflower oils. Avocado.  Seasoning and other foods  Herbs. Spices. Seasoning mixes without salt. Unsalted popcorn and pretzels. Fat-free sweets.  What foods are not recommended?  The items listed may not be a complete list. Talk with your dietitian about what dietary choices are best for you.  Grains  Baked goods made with fat, such as croissants, muffins, or some breads. Dry pasta or rice meal packs.  Vegetables  Creamed or fried vegetables. Vegetables in a cheese sauce. Regular canned vegetables (not low-sodium or reduced-sodium). Regular canned tomato sauce and paste (not low-sodium or reduced-sodium). Regular tomato and vegetable juice (not low-sodium or reduced-sodium). Pickles. Olives.  Fruits  Canned fruit in a light or heavy syrup. Fried fruit. Fruit in cream or butter sauce.  Meat and other protein foods  Fatty cuts of meat. Ribs. Fried meat. Bacon. Sausage. Bologna and other processed lunch meats. Salami. Fatback. Hotdogs. Bratwurst. Salted nuts and seeds. Canned beans with added salt. Canned or smoked fish. Whole eggs or egg yolks. Chicken or turkey with skin.  Dairy  Whole or 2% milk, cream, and half-and-half. Whole or full-fat cream cheese. Whole-fat or sweetened yogurt. Full-fat cheese. Nondairy creamers. Whipped toppings.  Processed cheese and cheese spreads.  Fats and oils  Butter. Stick margarine. Lard. Shortening. Ghee. Bacon fat. Tropical oils, such as coconut, palm kernel, or palm oil.  Seasoning and other foods  Salted popcorn and pretzels. Onion salt, garlic salt, seasoned salt, table salt, and sea salt. Worcestershire sauce. Tartar sauce. Barbecue sauce. Teriyaki sauce. Soy sauce, including reduced-sodium. Steak sauce. Canned and packaged gravies. Fish sauce. Oyster sauce. Cocktail sauce. Horseradish that you find on the shelf. Ketchup. Mustard. Meat flavorings and tenderizers. Bouillon cubes. Hot sauce and Tabasco sauce. Premade or packaged marinades. Premade or packaged taco seasonings. Relishes. Regular salad dressings.  Where to find more information:   National Heart, Lung, and Blood Institute: www.nhlbi.nih.gov   American Heart Association: www.heart.org  Summary   The DASH eating plan is a healthy eating plan that has been shown to reduce high blood pressure (hypertension). It may also reduce your risk for type 2 diabetes, heart disease, and stroke.   With the   DASH eating plan, you should limit salt (sodium) intake to 2,300 mg a day. If you have hypertension, you may need to reduce your sodium intake to 1,500 mg a day.   When on the DASH eating plan, aim to eat more fresh fruits and vegetables, whole grains, lean proteins, low-fat dairy, and heart-healthy fats.   Work with your health care provider or diet and nutrition specialist (dietitian) to adjust your eating plan to your individual calorie needs.  This information is not intended to replace advice given to you by your health care provider. Make sure you discuss any questions you have with your health care provider.  Document Released: 11/26/2011 Document Revised: 11/30/2016 Document Reviewed: 11/30/2016  [employer]vier Interactive Patient Education  2019 [employer]

## 2019-04-25 NOTE — Assessment & Plan Note (Signed)
Chronic, ongoing.  Continues to have readings, mainly SBP, above goal.  Increase Amlodipine to 5 MG daily and continue Losartan-HCTZ dose.  Recommend continuing to check BP daily at home and document.  Discussed goal BP is < 130/90.  Return in 4 weeks for follow-up and labs.

## 2019-04-25 NOTE — Progress Notes (Signed)
BP (!) 155/74   Pulse 75   Ht 5\' 5"  (3.212 m)   Wt 186 lb (84.4 kg)   LMP  (LMP Unknown)   BMI 30.95 kg/m    Subjective:    Patient ID: Lisa Bass, female    DOB: 08-18-1951, 68 y.o.   MRN: [id number]  HPI: Lisa Bass is a 68 y.o. female  Chief Complaint  Patient presents with  . Hypertension    f/u    . This visit was completed via WebEx due to the restrictions of the COVID-19 pandemic. All issues as above were discussed and addressed. Physical exam was done as above through visual confirmation on WebEx. If it was felt that the patient should be evaluated in the office, they were directed there. The patient verbally consented to this visit. . Location of the patient: home . Location of the provider: home . Those involved with this call:  . Provider: [name], DNP . CMA: Lesle Chris, [place] . Front Desk/Registration: Linard Millers  . Time spent on call: 15 minutes with patient face to face via video conference. More than 50% of this time was spent in counseling and coordination of care. 5 minutes total spent in review of patient's record and preparation of their chart. I verified patient identity using two factors (patient name and date of birth). Patient consents verbally to being seen via telemedicine visit today.   HYPERTENSION On 03/23/2019 added Amlodipine 2.5 MG daily to medication regimen due to continued elevations in SBP.  She also takes Losartan 100-HCTZ 25 tablet daily.  Denies any ADR with addition of Amlodipine.  She does report she is taking both medications daily and showed pill bottles to provider. Hypertension status: uncontrolled  Satisfied with current treatment? yes Duration of hypertension: chronic BP monitoring frequency:  daily BP range: 140-150/70-80 BP medication side effects:  no Medication compliance: good compliance Aspirin: no Recurrent headaches: no Visual changes: no Palpitations: no Dyspnea: no Chest pain: no Lower extremity  edema: no Dizzy/lightheaded: no  Relevant past medical, surgical, family and social history reviewed and updated as indicated. Interim medical history since our last visit reviewed. Allergies and medications reviewed and updated.  Review of Systems  Constitutional: Negative for activity change, appetite change, diaphoresis, fatigue and fever.  Respiratory: Negative for cough, chest tightness and shortness of breath.   Cardiovascular: Negative for chest pain, palpitations and leg swelling.  Gastrointestinal: Negative for abdominal distention, abdominal pain, constipation, diarrhea, nausea and vomiting.  Neurological: Negative for dizziness, syncope, weakness, light-headedness, numbness and headaches.  Psychiatric/Behavioral: Negative.     Per HPI unless specifically indicated above     Objective:    BP (!) 155/74   Pulse 75   Ht 5\' 5"  (1.651 m)   Wt 186 lb (84.4 kg)   LMP  (LMP Unknown)   BMI 30.95 kg/m   Wt Readings from Last 3 Encounters:  04/25/19 186 lb (84.4 kg)  03/23/19 181 lb (82.1 kg)  02/22/19 181 lb (82.1 kg)    Physical Exam Vitals signs and nursing note reviewed.  Constitutional:      General: She is awake.     Appearance: She is well-developed. She is not ill-appearing.  HENT:     Head: Normocephalic.     Right Ear: Hearing normal.     Left Ear: Hearing normal.     Nose: Nose normal.  Eyes:     General: Lids are normal.        Right  eye: No discharge.        Left eye: No discharge.     Conjunctiva/sclera: Conjunctivae normal.  Neck:     Musculoskeletal: Normal range of motion.  Cardiovascular:     Comments: Unable to auscultate due to virtual visit only.  Was able to view legs via virtual and no edema present. Pulmonary:     Effort: Pulmonary effort is normal. No accessory muscle usage or respiratory distress.     Comments: Unable to auscultate due to virtual visit only. Musculoskeletal:     Right lower leg: No edema.     Left lower leg: No edema.   Neurological:     Mental Status: She is alert and oriented to person, place, and time.  Psychiatric:        Attention and Perception: Attention normal.        Mood and Affect: Mood normal.        Behavior: Behavior normal. Behavior is cooperative.        Thought Content: Thought content normal.        Judgment: Judgment normal.     Results for orders placed or performed in visit on 02/22/19  CBC with Differential/Platelet  Result Value Ref Range   WBC 8.3 3.4 - 10.8 x10E3/uL   RBC 4.68 3.77 - 5.28 x10E6/uL   Hemoglobin 13.3 11.1 - 15.9 g/dL   Hematocrit 39.1 34.0 - 46.6 %   MCV 84 79 - 97 fL   MCH 28.4 26.6 - 33.0 pg   MCHC 34.0 31.5 - 35.7 g/dL   RDW 13.1 11.7 - 15.4 %   Platelets 226 150 - 450 x10E3/uL   Neutrophils 64 Not Estab. %   Lymphs 23 Not Estab. %   Monocytes 7 Not Estab. %   Eos 4 Not Estab. %   Basos 1 Not Estab. %   Neutrophils Absolute 5.5 1.4 - 7.0 x10E3/uL   Lymphocytes Absolute 1.9 0.7 - 3.1 x10E3/uL   Monocytes Absolute 0.5 0.1 - 0.9 x10E3/uL   EOS (ABSOLUTE) 0.3 0.0 - 0.4 x10E3/uL   Basophils Absolute 0.1 0.0 - 0.2 x10E3/uL   Immature Granulocytes 1 Not Estab. %   Immature Grans (Abs) 0.1 0.0 - 0.1 x10E3/uL  Comprehensive metabolic panel  Result Value Ref Range   Glucose 108 (H) 65 - 99 mg/dL   BUN 10 8 - 27 mg/dL   Creatinine, Ser 1.03 (H) 0.57 - 1.00 mg/dL   GFR calc non Af Amer 56 (L) >59 mL/min/1.73   GFR calc Af Amer 65 >59 mL/min/1.73   BUN/Creatinine Ratio 10 (L) 12 - 28   Sodium 140 134 - 144 mmol/L   Potassium 4.7 3.5 - 5.2 mmol/L   Chloride 102 96 - 106 mmol/L   CO2 23 20 - 29 mmol/L   Calcium 9.8 8.7 - 10.3 mg/dL   Total Protein 7.3 6.0 - 8.5 g/dL   Albumin 4.3 3.8 - 4.8 g/dL   Globulin, Total 3.0 1.5 - 4.5 g/dL   Albumin/Globulin Ratio 1.4 1.2 - 2.2   Bilirubin Total 0.5 0.0 - 1.2 mg/dL   Alkaline Phosphatase 125 (H) 39 - 117 IU/L   AST 21 0 - 40 IU/L   ALT 19 0 - 32 IU/L  Lipid Panel w/o Chol/HDL Ratio  Result Value Ref Range    Cholesterol, Total 226 (H) 100 - 199 mg/dL   Triglycerides 161 (H) 0 - 149 mg/dL   HDL 50 >39 mg/dL   VLDL Cholesterol Cal 32 5 -  40 mg/dL   LDL Calculated 144 (H) 0 - 99 mg/dL  TSH  Result Value Ref Range   TSH 4.620 (H) 0.450 - 4.500 uIU/mL  Vitamin B12  Result Value Ref Range   Vitamin B-12 294 232 - 1,245 pg/mL  HgB A1c  Result Value Ref Range   Hgb A1c MFr Bld 5.9 (H) 4.8 - 5.6 %   Est. average glucose Bld gHb Est-mCnc 123 mg/dL      Assessment & Plan:   Problem List Items Addressed This Visit      Cardiovascular and Mediastinum   Hypertension - Primary    Chronic, ongoing.  Continues to have readings, mainly SBP, above goal.  Increase Amlodipine to 5 MG daily and continue Losartan-HCTZ dose.  Recommend continuing to check BP daily at home and document.  Discussed goal BP is < 130/90.  Return in 4 weeks for follow-up and labs.      Relevant Medications   amLODipine (NORVASC) 5 MG tablet   Other Relevant Orders   Comprehensive metabolic panel     Other   Hyperlipidemia   Relevant Medications   amLODipine (NORVASC) 5 MG tablet   Other Relevant Orders   Comprehensive metabolic panel   Lipid Panel Piccolo, Waived   Elevated TSH   Relevant Orders   Thyroid Panel With TSH      I discussed the assessment and treatment plan with the patient. The patient was provided an opportunity to ask questions and all were answered. The patient agreed with the plan and demonstrated an understanding of the instructions.   The patient was advised to call back or seek an in-person evaluation if the symptoms worsen or if the condition fails to improve as anticipated.   I provided 15 minutes of time during this encounter.  Follow up plan: Return in about 4 weeks (around 05/23/2019) for HTN, HLD, thyroid.

## 2019-05-26 ENCOUNTER — Other Ambulatory Visit: Payer: Self-pay

## 2019-05-29 ENCOUNTER — Other Ambulatory Visit: Payer: Self-pay

## 2019-05-29 ENCOUNTER — Encounter: Payer: Self-pay | Admitting: Nurse Practitioner

## 2019-05-29 ENCOUNTER — Ambulatory Visit (INDEPENDENT_AMBULATORY_CARE_PROVIDER_SITE_OTHER): Payer: Medicare Other | Admitting: Nurse Practitioner

## 2019-05-29 VITALS — BP 130/81 | HR 75 | Temp 98.5°F | Ht 60.0 in | Wt 181.0 lb

## 2019-05-29 DIAGNOSIS — I10 Essential (primary) hypertension: Secondary | ICD-10-CM

## 2019-05-29 DIAGNOSIS — E538 Deficiency of other specified B group vitamins: Secondary | ICD-10-CM

## 2019-05-29 DIAGNOSIS — R7989 Other specified abnormal findings of blood chemistry: Secondary | ICD-10-CM | POA: Diagnosis not present

## 2019-05-29 DIAGNOSIS — E782 Mixed hyperlipidemia: Secondary | ICD-10-CM | POA: Diagnosis not present

## 2019-05-29 MED ORDER — ATORVASTATIN CALCIUM 10 MG PO TABS: 10.0000 mg | ORAL_TABLET | Freq: Every day | ORAL | 5 refills | Status: DC

## 2019-05-29 NOTE — Assessment & Plan Note (Signed)
Recheck thyroid panel today

## 2019-05-29 NOTE — Progress Notes (Signed)
BP 130/81   Pulse 75   Temp 98.5 F (36.9 C) (Oral)   Ht 5' (1.524 m)   Wt 181 lb (82.1 kg)   LMP  (LMP Unknown)   SpO2 95%   BMI 35.35 kg/m    Subjective:    Patient ID: Lisa Bass, female    DOB: Apr 22, 1951, 68 y.o.   MRN: [id number]  HPI: Lisa Bass is a 68 y.o. female  Chief Complaint  Patient presents with  . Hypertension    f/u   HYPERTENSION / HYPERLIPIDEMIA Amlodipine increased last visit to 5 MG daily and continues on Losartan-HCTZ.  No current statin, never been on medication for this.  Reports she is trying to eat healthier, more salads and less greasy. Satisfied with current treatment? yes Duration of hypertension: chronic BP monitoring frequency: daily BP range: 120-130/80's BP medication side effects: no Duration of hyperlipidemia: chronic Aspirin: yes Recent stressors: no Recurrent headaches: no Visual changes: no Palpitations: no Dyspnea: no Chest pain: no Lower extremity edema: no Dizzy/lightheaded: no   The 10-year ASCVD risk score [name], et al., 2013) is: 11.3%   Values used to calculate the score:     Age: 67 years     Sex: Female     Is Non-Hispanic African American: No     Diabetic: No     Tobacco smoker: No     Systolic Blood Pressure: 496 mmHg     Is BP treated: Yes     HDL Cholesterol: 50 mg/dL     Total Cholesterol: 226 mg/dL   VITAMIN B12 DEFICIENCY: Continues on daily supplement, does not always remember it.  Denies neuropathy pain, mouth sores, or fatigue.    ELEVATED TSH: Last TSH 4.620. Fatigue: no Cold intolerance: no Heat intolerance: no Weight gain: no Weight loss: no Constipation: no Diarrhea/loose stools: no Palpitations: no Lower extremity edema: no Anxiety/depressed mood: no  Relevant past medical, surgical, family and social history reviewed and updated as indicated. Interim medical history since our last visit reviewed. Allergies and medications reviewed and updated.  Review of Systems   Constitutional: Negative for activity change, appetite change, diaphoresis, fatigue and fever.  Respiratory: Negative for cough, chest tightness and shortness of breath.   Cardiovascular: Negative for chest pain, palpitations and leg swelling.  Gastrointestinal: Negative for abdominal distention, abdominal pain, constipation, diarrhea, nausea and vomiting.  Endocrine: Negative for cold intolerance, heat intolerance, polydipsia, polyphagia and polyuria.  Neurological: Negative for dizziness, syncope, weakness, light-headedness, numbness and headaches.  Psychiatric/Behavioral: Negative.     Per HPI unless specifically indicated above     Objective:    BP 130/81   Pulse 75   Temp 98.5 F (36.9 C) (Oral)   Ht 5' (1.524 m)   Wt 181 lb (82.1 kg)   LMP  (LMP Unknown)   SpO2 95%   BMI 35.35 kg/m   Wt Readings from Last 3 Encounters:  05/29/19 181 lb (82.1 kg)  04/25/19 186 lb (84.4 kg)  03/23/19 181 lb (82.1 kg)    Physical Exam Vitals signs and nursing note reviewed.  Constitutional:      General: She is awake. She is not in acute distress.    Appearance: She is well-developed. She is not ill-appearing.  HENT:     Head: Normocephalic.     Right Ear: Hearing normal.     Left Ear: Hearing normal.     Nose: Nose normal.     Mouth/Throat:  Mouth: Mucous membranes are moist.  Eyes:     General: Lids are normal.        Right eye: No discharge.        Left eye: No discharge.     Conjunctiva/sclera: Conjunctivae normal.     Pupils: Pupils are equal, round, and reactive to light.  Neck:     Musculoskeletal: Normal range of motion and neck supple.     Thyroid: No thyromegaly.     Vascular: No carotid bruit.  Cardiovascular:     Rate and Rhythm: Normal rate and regular rhythm.     Heart sounds: Normal heart sounds. No murmur. No gallop.   Pulmonary:     Effort: Pulmonary effort is normal. No accessory muscle usage or respiratory distress.     Breath sounds: Normal breath  sounds.  Abdominal:     General: Bowel sounds are normal.     Palpations: Abdomen is soft. There is no hepatomegaly or splenomegaly.  Musculoskeletal:     Right lower leg: No edema.     Left lower leg: No edema.  Lymphadenopathy:     Cervical: No cervical adenopathy.  Skin:    General: Skin is warm and dry.  Neurological:     Mental Status: She is alert and oriented to person, place, and time.  Psychiatric:        Attention and Perception: Attention normal.        Mood and Affect: Mood normal.        Behavior: Behavior normal. Behavior is cooperative.        Thought Content: Thought content normal.        Judgment: Judgment normal.     Results for orders placed or performed in visit on 02/22/19  CBC with Differential/Platelet  Result Value Ref Range   WBC 8.3 3.4 - 10.8 x10E3/uL   RBC 4.68 3.77 - 5.28 x10E6/uL   Hemoglobin 13.3 11.1 - 15.9 g/dL   Hematocrit 39.1 34.0 - 46.6 %   MCV 84 79 - 97 fL   MCH 28.4 26.6 - 33.0 pg   MCHC 34.0 31.5 - 35.7 g/dL   RDW 13.1 11.7 - 15.4 %   Platelets 226 150 - 450 x10E3/uL   Neutrophils 64 Not Estab. %   Lymphs 23 Not Estab. %   Monocytes 7 Not Estab. %   Eos 4 Not Estab. %   Basos 1 Not Estab. %   Neutrophils Absolute 5.5 1.4 - 7.0 x10E3/uL   Lymphocytes Absolute 1.9 0.7 - 3.1 x10E3/uL   Monocytes Absolute 0.5 0.1 - 0.9 x10E3/uL   EOS (ABSOLUTE) 0.3 0.0 - 0.4 x10E3/uL   Basophils Absolute 0.1 0.0 - 0.2 x10E3/uL   Immature Granulocytes 1 Not Estab. %   Immature Grans (Abs) 0.1 0.0 - 0.1 x10E3/uL  Comprehensive metabolic panel  Result Value Ref Range   Glucose 108 (H) 65 - 99 mg/dL   BUN 10 8 - 27 mg/dL   Creatinine, Ser 1.03 (H) 0.57 - 1.00 mg/dL   GFR calc non Af Amer 56 (L) >59 mL/min/1.73   GFR calc Af Amer 65 >59 mL/min/1.73   BUN/Creatinine Ratio 10 (L) 12 - 28   Sodium 140 134 - 144 mmol/L   Potassium 4.7 3.5 - 5.2 mmol/L   Chloride 102 96 - 106 mmol/L   CO2 23 20 - 29 mmol/L   Calcium 9.8 8.7 - 10.3 mg/dL   Total  Protein 7.3 6.0 - 8.5 g/dL   Albumin 4.3 3.8 -  4.8 g/dL   Globulin, Total 3.0 1.5 - 4.5 g/dL   Albumin/Globulin Ratio 1.4 1.2 - 2.2   Bilirubin Total 0.5 0.0 - 1.2 mg/dL   Alkaline Phosphatase 125 (H) 39 - 117 IU/L   AST 21 0 - 40 IU/L   ALT 19 0 - 32 IU/L  Lipid Panel w/o Chol/HDL Ratio  Result Value Ref Range   Cholesterol, Total 226 (H) 100 - 199 mg/dL   Triglycerides 161 (H) 0 - 149 mg/dL   HDL 50 >39 mg/dL   VLDL Cholesterol Cal 32 5 - 40 mg/dL   LDL Calculated 144 (H) 0 - 99 mg/dL  TSH  Result Value Ref Range   TSH 4.620 (H) 0.450 - 4.500 uIU/mL  Vitamin B12  Result Value Ref Range   Vitamin B-12 294 232 - 1,245 pg/mL  HgB A1c  Result Value Ref Range   Hgb A1c MFr Bld 5.9 (H) 4.8 - 5.6 %   Est. average glucose Bld gHb Est-mCnc 123 mg/dL      Assessment & Plan:   Problem List Items Addressed This Visit      Cardiovascular and Mediastinum   Hypertension - Primary    Chronic, stable with improvement noted in BP.  BP at goal.  Continue current medication regimen and adjust as needed.  Recommend continue to monitor BP daily at home.  CMP today.        Relevant Medications   atorvastatin (LIPITOR) 10 MG tablet     Other   Hyperlipidemia    Chronic, ongoing with ASCVD 11.3%.  Discussed initiation of statin medication, which patient agrees with trial of starting.  Discussed risks/benefits with patient + side effects to monitor for and advise provider if present.  Will start with Lipitor 10 MG and increase if tolerated in 4 weeks.  Return in 4 weeks for f/u and labs.      Relevant Medications   atorvastatin (LIPITOR) 10 MG tablet   Other Relevant Orders   Comprehensive metabolic panel   Lipid Panel w/o Chol/HDL Ratio   B12 deficiency    Continue supplement and recheck B12 level today.      Relevant Orders   Vitamin B12   Elevated TSH    Recheck thyroid panel today      Relevant Orders   Thyroid Panel With TSH       Follow up plan: Return in about 4 weeks  (around 06/26/2019) for HLD (lab recheck).

## 2019-05-29 NOTE — Assessment & Plan Note (Addendum)
Chronic, stable with improvement noted in BP.  BP at goal.  Continue current medication regimen and adjust as needed.  Recommend continue to monitor BP daily at home.  CMP today.

## 2019-05-29 NOTE — Assessment & Plan Note (Signed)
Continue supplement and recheck B12 level today. 

## 2019-05-29 NOTE — Addendum Note (Signed)
Addended by: [name] T on: 05/29/2019 10:09 AM   Modules accepted: Orders

## 2019-05-29 NOTE — Patient Instructions (Addendum)
Atorvastatin tablets What is this medicine? ATORVASTATIN (a TORE va sta tin) is known as a HMG-CoA reductase inhibitor or 'statin'. It lowers the level of cholesterol and triglycerides in the blood. This drug may also reduce the risk of heart attack, stroke, or other health problems in patients with risk factors for heart disease. Diet and lifestyle changes are often used with this drug. This medicine may be used for other purposes; ask your health care provider or pharmacist if you have questions. COMMON BRAND NAME(S): Lipitor What should I tell my health care provider before I take this medicine? They need to know if you have any of these conditions: -diabetes -if you often drink alcohol -history of stroke -kidney disease -liver disease -muscle aches or weakness -thyroid disease -an unusual or allergic reaction to atorvastatin, other medicines, foods, dyes, or preservatives -pregnant or trying to get pregnant -breast-feeding How should I use this medicine? Take this medicine by mouth with a glass of water. Follow the directions on the prescription label. You can take it with or without food. If it upsets your stomach, take it with food. Do not take with grapefruit juice. Take your medicine at regular intervals. Do not take it more often than directed. Do not stop taking except on your doctor's advice. Talk to your pediatrician regarding the use of this medicine in children. While this drug may be prescribed for children as young as 10 for selected conditions, precautions do apply. Overdosage: If you think you have taken too much of this medicine contact a poison control center or emergency room at once. NOTE: This medicine is only for you. Do not share this medicine with others. What if I miss a dose? If you miss a dose, take it as soon as you can. If your next dose is to be taken in less than 12 hours, then do not take the missed dose. Take the next dose at your regular time. Do not take  double or extra doses. What may interact with this medicine? Do not take this medicine with any of the following medications: -dasabuvir; ombitasvir; paritaprevir; ritonavir -ombitasvir; paritaprevir; ritonavir -posaconazole -red yeast rice This medicine may also interact with the following medications: -alcohol -birth control pills -certain antibiotics like erythromycin and clarithromycin -certain antivirals for HIV or hepatitis -certain medicines for cholesterol like fenofibrate, gemfibrozil, and niacin -certain medicines for fungal infections like ketoconazole and itraconazole -colchicine -cyclosporine -digoxin -grapefruit juice -rifampin This list may not describe all possible interactions. Give your health care provider a list of all the medicines, herbs, non-prescription drugs, or dietary supplements you use. Also tell them if you smoke, drink alcohol, or use illegal drugs. Some items may interact with your medicine. What should I watch for while using this medicine? Visit your doctor or health care professional for regular check-ups. You may need regular tests to make sure your liver is working properly. Your health care professional may tell you to stop taking this medicine if you develop muscle problems. If your muscle problems do not go away after stopping this medicine, contact your health care professional. Do not become pregnant while taking this medicine. Women should inform their health care professional if they wish to become pregnant or think they might be pregnant. There is a potential for serious side effects to an unborn child. Talk to your health care professional or pharmacist for more information. Do not breast-feed an infant while taking this medicine. This medicine may affect blood sugar levels. If you have diabetes, check  with your doctor or health care professional before you change your diet or the dose of your diabetic medicine. If you are going to need surgery  or other procedure, tell your doctor that you are using this medicine. This drug is only part of a total heart-health program. Your doctor or a dietician can suggest a low-cholesterol and low-fat diet to help. Avoid alcohol and smoking, and keep a proper exercise schedule. This medicine may cause a decrease in Co-Enzyme Q-10. You should make sure that you get enough Co-Enzyme Q-10 while you are taking this medicine. Discuss the foods you eat and the vitamins you take with your health care professional. What side effects may I notice from receiving this medicine? Side effects that you should report to your doctor or health care professional as soon as possible: -allergic reactions like skin rash, itching or hives, swelling of the face, lips, or tongue -fever -joint pain -loss of memory -redness, blistering, peeling or loosening of the skin, including inside the mouth -signs and symptoms of liver injury like dark yellow or brown urine; general ill feeling or flu-like symptoms; light-belly pain; unusually weak or tired; yellowing of the eyes or skin -signs and symptoms of muscle injury like dark urine; trouble passing urine or change in the amount of urine; unusually weak or tired; muscle pain or side or back pain Side effects that usually do not require medical attention (report to your doctor or health care professional if they continue or are bothersome): -diarrhea -nausea -stomach pain -trouble sleeping -upset stomach This list may not describe all possible side effects. Call your doctor for medical advice about side effects. You may report side effects to FDA at 1-800-FDA-1088. Where should I keep my medicine? Keep out of the reach of children. Store between 20 and 25 degrees C (68 and 77 degrees F). Throw away any unused medicine after the expiration date. NOTE: This sheet is a summary. It may not cover all possible information. If you have questions about this medicine, talk to your doctor,  pharmacist, or health care provider.  2019 Elsevier/Gold Standard (2018-04-08 11:36:48)   Cholesterol  Cholesterol is a fat. Your body needs a small amount of cholesterol. Cholesterol (plaque) may build up in your blood vessels (arteries). That makes you more likely to have a heart attack or stroke. You cannot feel your cholesterol level. Having a blood test is the only way to find out if your level is high. Keep your test results. Work with your doctor to keep your cholesterol at a good level. What do the results mean?  Total cholesterol is how much cholesterol is in your blood.  LDL is bad cholesterol. This is the type that can build up. Try to have low LDL.  HDL is good cholesterol. It cleans your blood vessels and carries LDL away. Try to have high HDL.  Triglycerides are fat that the body can store or burn for energy. What are good levels of cholesterol?  Total cholesterol below 200.  LDL below 100 is good for people who have health risks. LDL below 70 is good for people who have very high risks.  HDL above 40 is good. It is best to have HDL of 60 or higher.  Triglycerides below 150. How can I lower my cholesterol? Diet Follow your diet program as told by your doctor.  Choose fish, white meat chicken, or [place] that is roasted or baked. Try not to eat red meat, fried foods, sausage, or lunch meats.  Eat lots of fresh fruits and vegetables.  Choose whole grains, beans, pasta, potatoes, and cereals.  Choose olive oil, corn oil, or canola oil. Only use small amounts.  Try not to eat butter, mayonnaise, shortening, or palm kernel oils.  Try not to eat foods with trans fats.  Choose low-fat or nonfat dairy foods. ? Drink skim or nonfat milk. ? Eat low-fat or nonfat yogurt and cheeses. ? Try not to drink whole milk or cream. ? Try not to eat ice cream, egg yolks, or full-fat cheeses.  Healthy desserts include angel food cake, ginger snaps, animal crackers, hard candy,  popsicles, and low-fat or nonfat frozen yogurt. Try not to eat pastries, cakes, pies, and cookies.  Exercise Follow your exercise program as told by your doctor.  Be more active. Try gardening, walking, and taking the stairs.  Ask your doctor about ways that you can be more active. Medicine  Take over-the-counter and prescription medicines only as told by your doctor. This information is not intended to replace advice given to you by your health care provider. Make sure you discuss any questions you have with your health care provider. Document Released: 03/05/2009 Document Revised: 07/08/2016 Document Reviewed: 06/18/2016 Elsevier Interactive Patient Education  2019 [place] DASH stands for "Dietary Approaches to Stop Hypertension." The DASH eating plan is a healthy eating plan that has been shown to reduce high blood pressure (hypertension). It may also reduce your risk for type 2 diabetes, heart disease, and stroke. The DASH eating plan may also help with weight loss. What are tips for following this plan?  General guidelines  Avoid eating more than 2,300 mg (milligrams) of salt (sodium) a day. If you have hypertension, you may need to reduce your sodium intake to 1,500 mg a day.  Limit alcohol intake to no more than 1 drink a day for nonpregnant women and 2 drinks a day for men. One drink equals 12 oz of beer, 5 oz of wine, or 1 oz of hard liquor.  Work with your health care provider to maintain a healthy body weight or to lose weight. Ask what an ideal weight is for you.  Get at least 30 minutes of exercise that causes your heart to beat faster (aerobic exercise) most days of the week. Activities may include walking, swimming, or biking.  Work with your health care provider or diet and nutrition specialist (dietitian) to adjust your eating plan to your individual calorie needs. Reading food labels   Check food labels for the amount of sodium per  serving. Choose foods with less than 5 percent of the Daily Value of sodium. Generally, foods with less than 300 mg of sodium per serving fit into this eating plan.  To find whole grains, look for the word "whole" as the first word in the ingredient list. Shopping  Buy products labeled as "low-sodium" or "no salt added."  Buy fresh foods. Avoid canned foods and premade or frozen meals. Cooking  Avoid adding salt when cooking. Use salt-free seasonings or herbs instead of table salt or sea salt. Check with your health care provider or pharmacist before using salt substitutes.  Do not fry foods. Cook foods using healthy methods such as baking, boiling, grilling, and broiling instead.  Cook with heart-healthy oils, such as olive, canola, soybean, or sunflower oil. Meal planning  Eat a balanced diet that includes: ? 5 or more servings of fruits and vegetables each day. At each meal,  try to fill half of your plate with fruits and vegetables. ? Up to 6-8 servings of whole grains each day. ? Less than 6 oz of lean meat, poultry, or fish each day. A 3-oz serving of meat is about the same size as a deck of cards. One egg equals 1 oz. ? 2 servings of low-fat dairy each day. ? A serving of nuts, seeds, or beans 5 times each week. ? Heart-healthy fats. Healthy fats called Omega-3 fatty acids are found in foods such as flaxseeds and coldwater fish, like sardines, salmon, and mackerel.  Limit how much you eat of the following: ? Canned or prepackaged foods. ? Food that is high in trans fat, such as fried foods. ? Food that is high in saturated fat, such as fatty meat. ? Sweets, desserts, sugary drinks, and other foods with added sugar. ? Full-fat dairy products.  Do not salt foods before eating.  Try to eat at least 2 vegetarian meals each week.  Eat more home-cooked food and less restaurant, buffet, and fast food.  When eating at a restaurant, ask that your food be prepared with less salt or  no salt, if possible. What foods are recommended? The items listed may not be a complete list. Talk with your dietitian about what dietary choices are best for you. Grains Whole-grain or whole-wheat bread. Whole-grain or whole-wheat pasta. Brown rice. [place]. Bulgur. Whole-grain and low-sodium cereals. Pita bread. Low-fat, low-sodium crackers. Whole-wheat flour tortillas. Vegetables Fresh or frozen vegetables (raw, steamed, roasted, or grilled). Low-sodium or reduced-sodium tomato and vegetable juice. Low-sodium or reduced-sodium tomato sauce and tomato paste. Low-sodium or reduced-sodium canned vegetables. Fruits All fresh, dried, or frozen fruit. Canned fruit in natural juice (without added sugar). Meat and other protein foods Skinless chicken or [place]. Ground chicken or [place]. Pork with fat trimmed off. Fish and seafood. Egg whites. Dried beans, peas, or lentils. Unsalted nuts, nut butters, and seeds. Unsalted canned beans. Lean cuts of beef with fat trimmed off. Low-sodium, lean deli meat. Dairy Low-fat (1%) or fat-free (skim) milk. Fat-free, low-fat, or reduced-fat cheeses. Nonfat, low-sodium ricotta or cottage cheese. Low-fat or nonfat yogurt. Low-fat, low-sodium cheese. Fats and oils Soft margarine without trans fats. Vegetable oil. Low-fat, reduced-fat, or light mayonnaise and salad dressings (reduced-sodium). Canola, safflower, olive, soybean, and sunflower oils. Avocado. Seasoning and other foods Herbs. Spices. Seasoning mixes without salt. Unsalted popcorn and pretzels. Fat-free sweets. What foods are not recommended? The items listed may not be a complete list. Talk with your dietitian about what dietary choices are best for you. Grains Baked goods made with fat, such as croissants, muffins, or some breads. Dry pasta or rice meal packs. Vegetables Creamed or fried vegetables. Vegetables in a cheese sauce. Regular canned vegetables (not low-sodium or reduced-sodium).  Regular canned tomato sauce and paste (not low-sodium or reduced-sodium). Regular tomato and vegetable juice (not low-sodium or reduced-sodium). Angie Fava. Olives. Fruits Canned fruit in a light or heavy syrup. Fried fruit. Fruit in cream or butter sauce. Meat and other protein foods Fatty cuts of meat. Ribs. Fried meat. Berniece Salines. Sausage. Bologna and other processed lunch meats. Salami. Fatback. Hotdogs. Bratwurst. Salted nuts and seeds. Canned beans with added salt. Canned or smoked fish. Whole eggs or egg yolks. Chicken or [place] with skin. Dairy Whole or 2% milk, cream, and half-and-half. Whole or full-fat cream cheese. Whole-fat or sweetened yogurt. Full-fat cheese. Nondairy creamers. Whipped toppings. Processed cheese and cheese spreads. Fats and oils Butter. Stick margarine. Lard. Shortening. Ghee.  Bacon fat. Tropical oils, such as coconut, palm kernel, or palm oil. Seasoning and other foods Salted popcorn and pretzels. Onion salt, garlic salt, seasoned salt, table salt, and sea salt. Worcestershire sauce. Tartar sauce. Barbecue sauce. Teriyaki sauce. Soy sauce, including reduced-sodium. Steak sauce. Canned and packaged gravies. Fish sauce. Oyster sauce. Cocktail sauce. Horseradish that you find on the shelf. Ketchup. Mustard. Meat flavorings and tenderizers. Bouillon cubes. Hot sauce and Tabasco sauce. Premade or packaged marinades. Premade or packaged taco seasonings. Relishes. Regular salad dressings. Where to find more information:  National Heart, Lung, and [place]: https://wilson-eaton.com/  American Heart Association: www.heart.org Summary  The DASH eating plan is a healthy eating plan that has been shown to reduce high blood pressure (hypertension). It may also reduce your risk for type 2 diabetes, heart disease, and stroke.  With the DASH eating plan, you should limit salt (sodium) intake to 2,300 mg a day. If you have hypertension, you may need to reduce your sodium intake to 1,500 mg  a day.  When on the DASH eating plan, aim to eat more fresh fruits and vegetables, whole grains, lean proteins, low-fat dairy, and heart-healthy fats.  Work with your health care provider or diet and nutrition specialist (dietitian) to adjust your eating plan to your individual calorie needs. This information is not intended to replace advice given to you by your health care provider. Make sure you discuss any questions you have with your health care provider. Document Released: 11/26/2011 Document Revised: 11/30/2016 Document Reviewed: 11/30/2016 Elsevier Interactive Patient Education  2019 [employer].

## 2019-05-29 NOTE — Assessment & Plan Note (Signed)
Chronic, ongoing with ASCVD 11.3%.  Discussed initiation of statin medication, which patient agrees with trial of starting.  Discussed risks/benefits with patient + side effects to monitor for and advise provider if present.  Will start with Lipitor 10 MG and increase if tolerated in 4 weeks.  Return in 4 weeks for f/u and labs.

## 2019-05-30 ENCOUNTER — Ambulatory Visit
Admission: RE | Admit: 2019-05-30 | Discharge: 2019-05-30 | Disposition: A | Discharge status: Home / Self Care | Payer: Medicare Other | Source: Ambulatory Visit | Attending: Nurse Practitioner | Admitting: Nurse Practitioner

## 2019-05-30 DIAGNOSIS — Z780 Asymptomatic menopausal state: Secondary | ICD-10-CM

## 2019-05-30 DIAGNOSIS — M85852 Other specified disorders of bone density and structure, left thigh: Secondary | ICD-10-CM | POA: Diagnosis not present

## 2019-05-30 LAB — COMPREHENSIVE METABOLIC PANEL
ALT: 19 IU/L (ref 0–32)
AST: 22 IU/L (ref 0–40)
Albumin/Globulin Ratio: 1.2 (ref 1.2–2.2)
Albumin: 4.2 g/dL (ref 3.8–4.8)
Alkaline Phosphatase: 120 IU/L — ABNORMAL HIGH (ref 39–117)
BUN/Creatinine Ratio: 14 (ref 12–28)
BUN: 15 mg/dL (ref 8–27)
Bilirubin Total: 0.3 mg/dL (ref 0.0–1.2)
CO2: 26 mmol/L (ref 20–29)
Calcium: 9.8 mg/dL (ref 8.7–10.3)
Chloride: 103 mmol/L (ref 96–106)
Creatinine, Ser: 1.1 mg/dL — ABNORMAL HIGH (ref 0.57–1.00)
GFR calc Af Amer: 60 mL/min/{1.73_m2} (ref >59)
GFR calc non Af Amer: 52 mL/min/{1.73_m2} — ABNORMAL LOW (ref >59)
Globulin, Total: 3.6 g/dL (ref 1.5–4.5)
Glucose: 107 mg/dL — ABNORMAL HIGH (ref 65–99)
Potassium: 4.6 mmol/L (ref 3.5–5.2)
Sodium: 140 mmol/L (ref 134–144)
Total Protein: 7.8 g/dL (ref 6.0–8.5)

## 2019-05-30 LAB — VITAMIN B12: Vitamin B-12: 290 pg/mL (ref 232–1245)

## 2019-05-30 LAB — THYROID PANEL WITH TSH
Free Thyroxine Index: 1.4 (ref 1.2–4.9)
T3 Uptake Ratio: 21 % — ABNORMAL LOW (ref 24–39)
T4, Total: 6.7 ug/dL (ref 4.5–12.0)
TSH: 4.16 u[IU]/mL (ref 0.450–4.500)

## 2019-05-30 LAB — LIPID PANEL W/O CHOL/HDL RATIO
Cholesterol, Total: 229 mg/dL — ABNORMAL HIGH (ref 100–199)
HDL: 47 mg/dL (ref >39)
LDL Calculated: 155 mg/dL — ABNORMAL HIGH (ref 0–99)
Triglycerides: 135 mg/dL (ref 0–149)
VLDL Cholesterol Cal: 27 mg/dL (ref 5–40)

## 2019-06-06 ENCOUNTER — Telehealth: Payer: Self-pay

## 2019-06-20 ENCOUNTER — Ambulatory Visit (INDEPENDENT_AMBULATORY_CARE_PROVIDER_SITE_OTHER): Payer: Medicare Other | Admitting: Pharmacist

## 2019-06-20 DIAGNOSIS — I10 Essential (primary) hypertension: Secondary | ICD-10-CM

## 2019-06-20 NOTE — Patient Instructions (Signed)
Visit Information  Goals Addressed            This Visit's Progress     Patient Stated   . "I have a lot of medications" (pt-stated)       Current Barriers:  . Polypharmacy, and patient confusion about her medication regimen, purpose of each agent, monitoring parameters . Patient notes some episodes of loose stools in the few days after her last appointment with [name], NP, and she wonders if it could have been related to any of her medications. Denies any recurrent diarrhea or fecal accidents since that episode o HTN: losartan 100-25 mg, amlodipine 5 mg; notes that she has been unable to get her BP cuff to work  o Depression: paroxetine 20 mg QAM o CV Risk Reduction: atorvastatin 10 mg QPM, aspirin 81 mg QPM o Osteopenia: Vitamin D3 (believes it is 1000 units), Calcium + Vitamin D (also reads off 1000 units, unsure calcium component); notes that she does not regularly consume other calcium sources on a regular basis o Low B12 - Vitamin B12 1000 units daily   Pharmacist Clinical Goal(s):  Marland Kitchen Over the next 90 days, patient will work with PharmD and primary care provider to address needs related to optimized medication management  Interventions: . Comprehensive medication review performed. . Discussed goal calcium and Vitamin D daily intake recommendations for osteopenia.  . Patient has an appointment with primary care provider next week. Will meet with patient to review her OTC purchases to ensure appropriate dosing and administration. Patient notes that she uses an AM/PM pill box; will likely plan to provide with list of which medications to put in each slot  . Patient will also bring BP cuff for troubleshooting help   Patient Self Care Activities:  . Self administers medications as prescribed . Calls provider office for new concerns or questions  Initial goal documentation        Ms. Narducci was given information about Chronic Care Management services today including:   1. CCM service includes personalized support from designated clinical staff supervised by her physician, including individualized plan of care and coordination with other care providers 2. 24/7 contact phone numbers for assistance for urgent and routine care needs. 3. Service will only be billed when office clinical staff spend 20 minutes or more in a month to coordinate care. 4. Only one practitioner may furnish and bill the service in a calendar month. 5. The patient may stop CCM services at any time (effective at the end of the month) by phone call to the office staff. 6. The patient will be responsible for cost sharing (co-pay) of up to 20% of the service fee (after annual deductible is met).  Patient agreed to services and verbal consent obtained.   The patient verbalized understanding of instructions provided today and declined a print copy of patient instruction materials.   Plan: - Will meet with patient face to face next week with primary care provider.   Catie Darnelle Maffucci, PharmD Clinical Pharmacist [place] [phone]

## 2019-06-20 NOTE — Chronic Care Management (AMB) (Signed)
Chronic Care Management   Note  06/20/2019 Name: Lisa Bass MRN: [id number] DOB: 13-Nov-1951   Subjective:  Lisa Bass is a 68 y.o. year old female who is a primary care patient of Cannady, Barbaraann Faster, NP. The CCM team was consulted for assistance with chronic disease management and care coordination needs.    Received referral from primary care provider for medication adherence and education support.    Lisa Bass was given information about Chronic Care Management services today including:  1. CCM service includes personalized support from designated clinical staff supervised by her physician, including individualized plan of care and coordination with other care providers 2. 24/7 contact phone numbers for assistance for urgent and routine care needs. 3. Service will only be billed when office clinical staff spend 20 minutes or more in a month to coordinate care. 4. Only one practitioner may furnish and bill the service in a calendar month. 5. The patient may stop CCM services at any time (effective at the end of the month) by phone call to the office staff. 6. The patient will be responsible for cost sharing (co-pay) of up to 20% of the service fee (after annual deductible is met).  Patient agreed to services and verbal consent obtained.   Review of patient status, including review of consultants reports, laboratory and other test data, was performed as part of comprehensive evaluation and provision of chronic care management services.   Objective:  Lab Results  Component Value Date   CREATININE 1.10 (H) 05/29/2019   CREATININE 1.03 (H) 02/22/2019   CREATININE 0.85 02/11/2018    Lab Results  Component Value Date   HGBA1C 5.9 (H) 02/22/2019       Component Value Date/Time   CHOL 229 (H) 05/29/2019 0958   TRIG 135 05/29/2019 0958   HDL 47 05/29/2019 0958   LDLCALC 155 (H) 05/29/2019 0958    Clinical ASCVD: No  The 10-year ASCVD risk score [name], et al.,  2013) is: 11.6%   Values used to calculate the score:     Age: 22 years     Sex: Female     Is Non-Hispanic African American: No     Diabetic: No     Tobacco smoker: No     Systolic Blood Pressure: 782 mmHg     Is BP treated: Yes     HDL Cholesterol: 47 mg/dL     Total Cholesterol: 229 mg/dL    BP Readings from Last 3 Encounters:  05/29/19 130/81  04/25/19 (!) 155/74  03/23/19 (!) 149/78    Allergies  Allergen Reactions  . Penicillins Anaphylaxis  . Biaxin [Clarithromycin] Other (See Comments)    GI issues  . Lisinopril Diarrhea    Medications Reviewed Today    Reviewed by De Hollingshead, [facility] (Pharmacist) on 06/20/19 at 1245  Med List Status: <None>  Medication Order Taking? Sig Documenting Provider Last Dose Status Informant  amLODipine (NORVASC) 5 MG tablet [id number] Yes Take 1 tablet (5 mg total) by mouth daily. [name] T, NP Taking Active   aspirin 81 MG tablet [id number] Yes Take 81 mg by mouth daily. [provider] Taking Active            Med Note ([place] Jun 20, 2019 12:32 PM) QAM  atorvastatin (LIPITOR) 10 MG tablet [id number] Yes Take 1 tablet (10 mg total) by mouth daily at 6 PM. Venita Lick, NP Taking Active   Calcium Carb-Cholecalciferol (  CALCIUM 1000 + D PO) [id number] Yes Take by mouth. [provider] Taking Active   cholecalciferol (VITAMIN D3) 25 MCG (1000 UT) tablet [id number] Yes Take 1,000 Units by mouth daily. [provider] Taking Active   Cyanocobalamin (VITAMIN B 12 PO) [id number] Yes Take by mouth daily. [provider] Taking Active Self  losartan-hydrochlorothiazide (HYZAAR) 100-25 MG tablet [id number] Yes Take 1 tablet by mouth daily. [name] T, NP Taking Active   PARoxetine (PAXIL) 40 MG tablet [id number] Yes Take 0.5 tablets (20 mg total) by mouth daily. Take 1/2 dose Cannady, Barbaraann Faster, NP Taking Active            Assessment:   Goals Addressed            This  Visit's Progress     Patient Stated   . "I have a lot of medications" (pt-stated)       Current Barriers:  . Polypharmacy, and patient confusion about her medication regimen, purpose of each agent, monitoring parameters . Patient notes some episodes of loose stools in the few days after her last appointment with [name], NP, and she wonders if it could have been related to any of her medications. Denies any recurrent diarrhea or fecal accidents since that episode o HTN: losartan 100-25 mg, amlodipine 5 mg; notes that she has been unable to get her BP cuff to work  o Depression: paroxetine 20 mg QAM o CV Risk Reduction: atorvastatin 10 mg QPM, aspirin 81 mg QPM o Osteopenia: Vitamin D3 (believes it is 1000 units), Calcium + Vitamin D (also reads off 1000 units, unsure calcium component); notes that she does not regularly consume other calcium sources on a regular basis o Low B12 - Vitamin B12 1000 units daily   Pharmacist Clinical Goal(s):  Marland Kitchen Over the next 90 days, patient will work with PharmD and primary care provider to address needs related to optimized medication management  Interventions: . Comprehensive medication review performed. . Discussed goal calcium and Vitamin D daily intake recommendations for osteopenia.  . Patient has an appointment with primary care provider next week. Will meet with patient to review her OTC purchases to ensure appropriate dosing and administration. Patient notes that she uses an AM/PM pill box; will likely plan to provide with list of which medications to put in each slot  . Patient will also bring BP cuff for troubleshooting help   Patient Self Care Activities:  . Self administers medications as prescribed . Calls provider office for new concerns or questions  Initial goal documentation        Plan: - Will meet with patient face to face next week with primary care provider.   Catie Darnelle Maffucci, PharmD Clinical Pharmacist [place] [phone]

## 2019-06-21 ENCOUNTER — Telehealth: Payer: Self-pay

## 2019-06-27 ENCOUNTER — Ambulatory Visit (INDEPENDENT_AMBULATORY_CARE_PROVIDER_SITE_OTHER): Payer: Medicare Other | Admitting: Nurse Practitioner

## 2019-06-27 ENCOUNTER — Other Ambulatory Visit: Payer: Self-pay

## 2019-06-27 ENCOUNTER — Ambulatory Visit: Payer: Medicare Other

## 2019-06-27 ENCOUNTER — Encounter: Payer: Self-pay | Admitting: Nurse Practitioner

## 2019-06-27 DIAGNOSIS — E782 Mixed hyperlipidemia: Secondary | ICD-10-CM | POA: Diagnosis not present

## 2019-06-27 MED ORDER — ROSUVASTATIN CALCIUM 10 MG PO TABS: ORAL_TABLET | ORAL | 3 refills | Status: DC

## 2019-06-27 NOTE — Progress Notes (Signed)
BP 121/66 Comment: pt reported  Pulse 66 Comment: pt reported  Wt 182 lb (82.6 kg) Comment: pt reported  LMP  (LMP Unknown)   BMI 35.54 kg/m    Subjective:    Patient ID: Lisa Bass, female    DOB: Oct 28, 1951, 68 y.o.   MRN: [id number]  HPI: Lisa Bass is a 68 y.o. female  Chief Complaint  Patient presents with  . Hyperlipidemia    4 week f/up    . This visit was completed via telephone due to the restrictions of the COVID-19 pandemic. All issues as above were discussed and addressed but no physical exam was performed. If it was felt that the patient should be evaluated in the office, they were directed there. The patient verbally consented to this visit. Patient was unable to complete an audio/visual visit due to Technical difficulties,Lack of internet. Due to the catastrophic nature of the COVID-19 pandemic, this visit was done through audio contact only. . Location of the patient: home . Location of the provider: work . Those involved with this call:  . Provider: [name], DNP . CMA: Merilyn Baba, CMA . Front Desk/Registration: Jill Side  . Time spent on call: 15 minutes on the phone discussing health concerns. 5 minutes total spent in review of patient's record and preparation of their chart. I verified patient identity using two factors (patient name and date of birth). Patient consents verbally to being seen via telemedicine visit today.   HYPERLIPIDEMIA Started Lipitor last visit, since starting it has been having diarrhea and GI upset.   Hyperlipidemia status: good compliance Satisfied with current treatment?  yes Side effects:  yes Medication compliance: good compliance Past cholesterol meds: atorvastain (lipitor) Supplements: none Aspirin:  yes The 10-year ASCVD risk score [name], et al., 2013) is: 10.1%   Values used to calculate the score:     Age: 70 years     Sex: Female     Is Non-Hispanic African American: No     Diabetic: No      Tobacco smoker: No     Systolic Blood Pressure: 258 mmHg     Is BP treated: Yes     HDL Cholesterol: 47 mg/dL     Total Cholesterol: 229 mg/dL Chest pain:  no Coronary artery disease:  no Family history CAD:  no Family history early CAD:  no   Relevant past medical, surgical, family and social history reviewed and updated as indicated. Interim medical history since our last visit reviewed. Allergies and medications reviewed and updated.  Review of Systems  Constitutional: Negative for activity change, appetite change, diaphoresis, fatigue and fever.  Respiratory: Negative for cough, chest tightness and shortness of breath.   Cardiovascular: Negative for chest pain, palpitations and leg swelling.  Gastrointestinal: Positive for diarrhea. Negative for abdominal distention, abdominal pain, constipation, nausea and vomiting.  Neurological: Negative for dizziness, syncope, weakness, light-headedness, numbness and headaches.  Psychiatric/Behavioral: Negative.     Per HPI unless specifically indicated above     Objective:    BP 121/66 Comment: pt reported  Pulse 66 Comment: pt reported  Wt 182 lb (82.6 kg) Comment: pt reported  LMP  (LMP Unknown)   BMI 35.54 kg/m   Wt Readings from Last 3 Encounters:  06/27/19 182 lb (82.6 kg)  05/29/19 181 lb (82.1 kg)  04/25/19 186 lb (84.4 kg)    Physical Exam   Unable to perform due to telephone only visit  Results for orders placed  or performed in visit on 05/29/19  Comprehensive metabolic panel  Result Value Ref Range   Glucose 107 (H) 65 - 99 mg/dL   BUN 15 8 - 27 mg/dL   Creatinine, Ser 1.10 (H) 0.57 - 1.00 mg/dL   GFR calc non Af Amer 52 (L) >59 mL/min/1.73   GFR calc Af Amer 60 >59 mL/min/1.73   BUN/Creatinine Ratio 14 12 - 28   Sodium 140 134 - 144 mmol/L   Potassium 4.6 3.5 - 5.2 mmol/L   Chloride 103 96 - 106 mmol/L   CO2 26 20 - 29 mmol/L   Calcium 9.8 8.7 - 10.3 mg/dL   Total Protein 7.8 6.0 - 8.5 g/dL   Albumin 4.2  3.8 - 4.8 g/dL   Globulin, Total 3.6 1.5 - 4.5 g/dL   Albumin/Globulin Ratio 1.2 1.2 - 2.2   Bilirubin Total 0.3 0.0 - 1.2 mg/dL   Alkaline Phosphatase 120 (H) 39 - 117 IU/L   AST 22 0 - 40 IU/L   ALT 19 0 - 32 IU/L  Lipid Panel w/o Chol/HDL Ratio  Result Value Ref Range   Cholesterol, Total 229 (H) 100 - 199 mg/dL   Triglycerides 135 0 - 149 mg/dL   HDL 47 >39 mg/dL   VLDL Cholesterol Cal 27 5 - 40 mg/dL   LDL Calculated 155 (H) 0 - 99 mg/dL  Thyroid Panel With TSH  Result Value Ref Range   TSH 4.160 0.450 - 4.500 uIU/mL   T4, Total 6.7 4.5 - 12.0 ug/dL   T3 Uptake Ratio 21 (L) 24 - 39 %   Free Thyroxine Index 1.4 1.2 - 4.9  Vitamin B12  Result Value Ref Range   Vitamin B-12 290 232 - 1,245 pg/mL      Assessment & Plan:   Problem List Items Addressed This Visit      Other   Hyperlipidemia    Chronic, will discontinue Lipitor due to side effects.  Trial Crestor 10 MG Monday/Wednesday/Friday schedule.  Return for in office visit in 4 weeks for labs and visit with provider.  She is to notify provider if ongoing side effects.      Relevant Medications   rosuvastatin (CRESTOR) 10 MG tablet      I discussed the assessment and treatment plan with the patient. The patient was provided an opportunity to ask questions and all were answered. The patient agreed with the plan and demonstrated an understanding of the instructions.   The patient was advised to call back or seek an in-person evaluation if the symptoms worsen or if the condition fails to improve as anticipated.   I provided 15 minutes of time during this encounter.  Follow up plan: Return in about 4 weeks (around 07/25/2019) for HTN/HLD.

## 2019-06-27 NOTE — Assessment & Plan Note (Signed)
Chronic, will discontinue Lipitor due to side effects.  Trial Crestor 10 MG Monday/Wednesday/Friday schedule.  Return for in office visit in 4 weeks for labs and visit with provider.  She is to notify provider if ongoing side effects.

## 2019-06-27 NOTE — Patient Instructions (Signed)
Fat and Cholesterol Restricted Eating Plan Getting too much fat and cholesterol in your diet may cause health problems. Choosing the right foods helps keep your fat and cholesterol at normal levels. This can keep you from getting certain diseases. Your doctor may recommend an eating plan that includes:  Total fat: ______% or less of total calories a day.  Saturated fat: ______% or less of total calories a day.  Cholesterol: less than _________mg a day.  Fiber: ______g a day. What are tips for following this plan? Meal planning  At meals, divide your plate into four equal parts: ? Fill one-half of your plate with vegetables and green salads. ? Fill one-fourth of your plate with whole grains. ? Fill one-fourth of your plate with low-fat (lean) protein foods.  Eat fish that is high in omega-3 fats at least two times a week. This includes mackerel, tuna, sardines, and salmon.  Eat foods that are high in fiber, such as whole grains, beans, apples, broccoli, carrots, peas, and barley. General tips   Work with your doctor to lose weight if you need to.  Avoid: ? Foods with added sugar. ? Fried foods. ? Foods with partially hydrogenated oils.  Limit alcohol intake to no more than 1 drink a day for nonpregnant women and 2 drinks a day for men. One drink equals 12 oz of beer, 5 oz of wine, or 1 oz of hard liquor. Reading food labels  Check food labels for: ? Trans fats. ? Partially hydrogenated oils. ? Saturated fat (g) in each serving. ? Cholesterol (mg) in each serving. ? Fiber (g) in each serving.  Choose foods with healthy fats, such as: ? Monounsaturated fats. ? Polyunsaturated fats. ? Omega-3 fats.  Choose grain products that have whole grains. Look for the word "whole" as the first word in the ingredient list. Cooking  Cook foods using low-fat methods. These include baking, boiling, grilling, and broiling.  Eat more home-cooked foods. Eat at restaurants and buffets  less often.  Avoid cooking using saturated fats, such as butter, cream, palm oil, palm kernel oil, and coconut oil. Recommended foods  Fruits  All fresh, canned (in natural juice), or frozen fruits. Vegetables  Fresh or frozen vegetables (raw, steamed, roasted, or grilled). Green salads. Grains  Whole grains, such as whole wheat or whole grain breads, crackers, cereals, and pasta. Unsweetened oatmeal, bulgur, barley, quinoa, or brown rice. Corn or whole wheat flour tortillas. Meats and other protein foods  Ground beef (85% or leaner), grass-fed beef, or beef trimmed of fat. Skinless chicken or turkey. Ground chicken or turkey. Pork trimmed of fat. All fish and seafood. Egg whites. Dried beans, peas, or lentils. Unsalted nuts or seeds. Unsalted canned beans. Nut butters without added sugar or oil. Dairy  Low-fat or nonfat dairy products, such as skim or 1% milk, 2% or reduced-fat cheeses, low-fat and fat-free ricotta or cottage cheese, or plain low-fat and nonfat yogurt. Fats and oils  Tub margarine without trans fats. Light or reduced-fat mayonnaise and salad dressings. Avocado. Olive, canola, sesame, or safflower oils. The items listed above may not be a complete list of foods and beverages you can eat. Contact a dietitian for more information. Foods to avoid Fruits  Canned fruit in heavy syrup. Fruit in cream or butter sauce. Fried fruit. Vegetables  Vegetables cooked in cheese, cream, or butter sauce. Fried vegetables. Grains  White bread. White pasta. White rice. Cornbread. Bagels, pastries, and croissants. Crackers and snack foods that contain trans fat   and hydrogenated oils. Meats and other protein foods  Fatty cuts of meat. Ribs, chicken wings, bacon, sausage, bologna, salami, chitterlings, fatback, hot dogs, bratwurst, and packaged lunch meats. Liver and organ meats. Whole eggs and egg yolks. Chicken and turkey with skin. Fried meat. Dairy  Whole or 2% milk, cream,  half-and-half, and cream cheese. Whole milk cheeses. Whole-fat or sweetened yogurt. Full-fat cheeses. Nondairy creamers and whipped toppings. Processed cheese, cheese spreads, and cheese curds. Beverages  Alcohol. Sugar-sweetened drinks such as sodas, lemonade, and fruit drinks. Fats and oils  Butter, stick margarine, lard, shortening, ghee, or bacon fat. Coconut, palm kernel, and palm oils. Sweets and desserts  Corn syrup, sugars, honey, and molasses. Candy. Jam and jelly. Syrup. Sweetened cereals. Cookies, pies, cakes, donuts, muffins, and ice cream. The items listed above may not be a complete list of foods and beverages you should avoid. Contact a dietitian for more information. Summary  Choosing the right foods helps keep your fat and cholesterol at normal levels. This can keep you from getting certain diseases.  At meals, fill one-half of your plate with vegetables and green salads.  Eat high-fiber foods, like whole grains, beans, apples, carrots, peas, and barley.  Limit added sugar, saturated fats, alcohol, and fried foods. This information is not intended to replace advice given to you by your health care provider. Make sure you discuss any questions you have with your health care provider. Document Released: 06/07/2012 Document Revised: 08/10/2018 Document Reviewed: 08/24/2017 Elsevier Patient Education  2020 Elsevier Inc.  

## 2019-06-30 ENCOUNTER — Ambulatory Visit (INDEPENDENT_AMBULATORY_CARE_PROVIDER_SITE_OTHER): Payer: Medicare Other | Admitting: Pharmacist

## 2019-06-30 ENCOUNTER — Encounter: Payer: Self-pay | Admitting: Pharmacist

## 2019-06-30 DIAGNOSIS — E782 Mixed hyperlipidemia: Secondary | ICD-10-CM

## 2019-06-30 DIAGNOSIS — I10 Essential (primary) hypertension: Secondary | ICD-10-CM | POA: Diagnosis not present

## 2019-06-30 DIAGNOSIS — E538 Deficiency of other specified B group vitamins: Secondary | ICD-10-CM

## 2019-06-30 NOTE — Patient Instructions (Signed)
Visit Information  Goals Addressed            This Visit's Progress     Patient Stated   . "I have a lot of medications" (pt-stated)       Current Barriers:  . Polypharmacy, and patient confusion about her medication regimen, purpose of each agent, monitoring parameters . At office visit with [name], NP this week, atorvastatin changed to TIW rosuvastatin, to see if this would help improve the diarrhea that patient has been experiencing for a few weeks. Initiation of diarrhea coincided with initiation of atorvastatin therapy.  o HTN: losartan/HCTZ100-25 mg, amlodipine 5 mg; notes that she purchased a new blood pressure cuff and has been checking regularly; SBP generally in the 120s. o Depression: paroxetine 20 mg QAM o CV Risk Reduction: rosuvastatin 20 mg TID, aspirin 81 mg QPM o Osteopenia: Vitamin D3, Calcium + Vitamin D. o Low B12 - Vitamin B12 1000 units daily   Pharmacist Clinical Goal(s):  Marland Kitchen Over the next 90 days, patient will work with PharmD and primary care provider to address needs related to optimized medication management  Interventions: . Comprehensive medication review performed. . Congratulated on checking BP regularly and showing that she is at goal BP.  Marland Kitchen Discussed purpose and monitoring for each medication. She requests a list of which medication to take at which time of the day - will provide via MyChart o Discussed moving amlodipine to HS administration for CV risk reduction. She was amenable to this.    Patient Self Care Activities:  . Self administers medications as prescribed . Calls provider office for new concerns or questions  Please see past updates related to this goal by clicking on the "Past Updates" button in the selected goal         The patient verbalized understanding of instructions provided today and declined a print copy of patient instruction materials.  Plan:  - Will outreach patient in 3-5 weeks for continued medication  management support  Catie Darnelle Maffucci, PharmD Clinical Pharmacist [place] [phone]

## 2019-06-30 NOTE — Chronic Care Management (AMB) (Signed)
  Chronic Care Management   Follow Up Note   06/30/2019 Name: Lisa Bass MRN: [id number] DOB: [birth year]  Referred by: Venita Lick, NP Reason for referral : Chronic Care Management (Medication Management)   Lisa Bass is a 68 y.o. year old female who is a primary care patient of Cannady, Barbaraann Faster, NP. The CCM team was consulted for assistance with chronic disease management and care coordination needs.    Contacted patient to follow up on medication management. Had planned to meet face to face during her provider visit earlier this week, but due to diarrhea, patient's earlier visit was conducted virtually.   Review of patient status, including review of consultants reports, relevant laboratory and other test results, and collaboration with appropriate care team members and the patient's provider was performed as part of comprehensive patient evaluation and provision of chronic care management services.    Goals Addressed            This Visit's Progress     Patient Stated   . "I have a lot of medications" (pt-stated)       Current Barriers:  . Polypharmacy, and patient confusion about her medication regimen, purpose of each agent, monitoring parameters . At office visit with [name], NP this week, atorvastatin changed to TIW rosuvastatin, to see if this would help improve the diarrhea that patient has been experiencing for a few weeks. Initiation of diarrhea coincided with initiation of atorvastatin therapy.  o HTN: losartan/HCTZ100-25 mg, amlodipine 5 mg; notes that she purchased a new blood pressure cuff and has been checking regularly; SBP generally in the 120s. o Depression: paroxetine 20 mg QAM o CV Risk Reduction: rosuvastatin 20 mg TID, aspirin 81 mg QPM o Osteopenia: Vitamin D3, Calcium + Vitamin D. o Low B12 - Vitamin B12 1000 units daily   Pharmacist Clinical Goal(s):  Marland Kitchen Over the next 90 days, patient will work with PharmD and primary care provider to  address needs related to optimized medication management  Interventions: . Comprehensive medication review performed. . Congratulated on checking BP regularly and showing that she is at goal BP.  Marland Kitchen Discussed purpose and monitoring for each medication. She requests a list of which medication to take at which time of the day - will provide via MyChart o Discussed moving amlodipine to HS administration for CV risk reduction. She was amenable to this.    Patient Self Care Activities:  . Self administers medications as prescribed . Calls provider office for new concerns or questions  Please see past updates related to this goal by clicking on the "Past Updates" button in the selected goal          Plan:  - Will outreach patient in 3-5 weeks for continued medication management support  Catie Darnelle Maffucci, PharmD Clinical Pharmacist [place] [phone]

## 2019-07-06 ENCOUNTER — Other Ambulatory Visit: Payer: Self-pay

## 2019-07-06 NOTE — Patient Outreach (Signed)
[facility]) Care Management  07/06/2019  JAYLAH GOODLOW 03/05/1951 [id number]   Medication Adherence call to Mrs. Sandra Cockayne HIPPA Compliant Voice message left with a call back number. Mrs. Milligan is showing past due on Losartan 100 mg under [place].   [facility] Direct Dial [phone]  Fax [phone] [email or web address]

## 2019-07-18 ENCOUNTER — Encounter: Payer: Self-pay | Admitting: Nurse Practitioner

## 2019-07-18 ENCOUNTER — Other Ambulatory Visit: Payer: Self-pay

## 2019-07-18 ENCOUNTER — Ambulatory Visit (INDEPENDENT_AMBULATORY_CARE_PROVIDER_SITE_OTHER): Payer: Medicare Other | Admitting: Nurse Practitioner

## 2019-07-18 VITALS — BP 133/81 | Temp 97.2°F | Wt 180.0 lb

## 2019-07-18 DIAGNOSIS — Z20828 Contact with and (suspected) exposure to other viral communicable diseases: Secondary | ICD-10-CM | POA: Insufficient documentation

## 2019-07-18 DIAGNOSIS — Z20822 Contact with and (suspected) exposure to covid-19: Secondary | ICD-10-CM | POA: Insufficient documentation

## 2019-07-18 NOTE — Progress Notes (Signed)
BP 133/81 Comment: pt reported  Temp (!) 97.2 F (36.2 C) (Oral) Comment: pt reported  Wt 180 lb (81.6 kg) Comment: pt reported  LMP  (LMP Unknown)   BMI 35.15 kg/m    Subjective:    Patient ID: Lisa Bass, female    DOB: 12/19/1951, 68 y.o.   MRN: [id number]  HPI: Lisa Bass is a 68 y.o. female  Chief Complaint  Patient presents with  . COVID    pt states she was exposed to her son in law who tested positive last week     . This visit was completed via telephone due to the restrictions of the COVID-19 pandemic. All issues as above were discussed and addressed but no physical exam was performed. If it was felt that the patient should be evaluated in the office, they were directed there. The patient verbally consented to this visit. Patient was unable to complete an audio/visual visit due to Lack of equipment. Due to the catastrophic nature of the COVID-19 pandemic, this visit was done through audio contact only. . Location of the patient: home . Location of the provider: work . Those involved with this call:  . Provider: [name], DNP . CMA: Yvonna Alanis, CMA . Front Desk/Registration: Jill Side  . Time spent on call: 15 minutes on the phone discussing health concerns. 10 minutes total spent in review of patient's record and preparation of their chart.  . I verified patient identity using two factors (patient name and date of birth). Patient consents verbally to being seen via telemedicine visit today.    COVID EXPOSURE: Her son-in-law was diagnosed Tuesday with Covid and she had been around him one week ago.  She denies any symptoms.  Denies loss of taste or smell.   Fever: no Cough: no Shortness of breath: no Wheezing: no Chest pain: no Chest tightness: no Chest congestion: no Nasal congestion: no Runny nose: no Post nasal drip: no Sneezing: no Sore throat: no Swollen glands: no Sinus pressure: no Headache: no Face pain: no Toothache: no  Ear pain: none Ear pressure:none Eyes red/itching:no Eye drainage/crusting: no  Vomiting: no Rash: no Fatigue: no Sick contacts: no   Relevant past medical, surgical, family and social history reviewed and updated as indicated. Interim medical history since our last visit reviewed. Allergies and medications reviewed and updated.  Review of Systems  Constitutional: Negative for activity change, appetite change, diaphoresis, fatigue and fever.  HENT: Negative.   Respiratory: Negative for cough, chest tightness and shortness of breath.   Cardiovascular: Negative for chest pain, palpitations and leg swelling.  Gastrointestinal: Negative for abdominal distention, abdominal pain, constipation, diarrhea, nausea and vomiting.  Neurological: Negative for dizziness, syncope, weakness, light-headedness, numbness and headaches.  Psychiatric/Behavioral: Negative.     Per HPI unless specifically indicated above     Objective:    BP 133/81 Comment: pt reported  Temp (!) 97.2 F (36.2 C) (Oral) Comment: pt reported  Wt 180 lb (81.6 kg) Comment: pt reported  LMP  (LMP Unknown)   BMI 35.15 kg/m   Wt Readings from Last 3 Encounters:  07/18/19 180 lb (81.6 kg)  06/27/19 182 lb (82.6 kg)  05/29/19 181 lb (82.1 kg)    Physical Exam   Unable to perform due to technical difficulties.  Results for orders placed or performed in visit on 05/29/19  Comprehensive metabolic panel  Result Value Ref Range   Glucose 107 (H) 65 - 99 mg/dL   BUN 15 8 -  27 mg/dL   Creatinine, Ser 1.10 (H) 0.57 - 1.00 mg/dL   GFR calc non Af Amer 52 (L) >59 mL/min/1.73   GFR calc Af Amer 60 >59 mL/min/1.73   BUN/Creatinine Ratio 14 12 - 28   Sodium 140 134 - 144 mmol/L   Potassium 4.6 3.5 - 5.2 mmol/L   Chloride 103 96 - 106 mmol/L   CO2 26 20 - 29 mmol/L   Calcium 9.8 8.7 - 10.3 mg/dL   Total Protein 7.8 6.0 - 8.5 g/dL   Albumin 4.2 3.8 - 4.8 g/dL   Globulin, Total 3.6 1.5 - 4.5 g/dL   Albumin/Globulin  Ratio 1.2 1.2 - 2.2   Bilirubin Total 0.3 0.0 - 1.2 mg/dL   Alkaline Phosphatase 120 (H) 39 - 117 IU/L   AST 22 0 - 40 IU/L   ALT 19 0 - 32 IU/L  Lipid Panel w/o Chol/HDL Ratio  Result Value Ref Range   Cholesterol, Total 229 (H) 100 - 199 mg/dL   Triglycerides 135 0 - 149 mg/dL   HDL 47 >39 mg/dL   VLDL Cholesterol Cal 27 5 - 40 mg/dL   LDL Calculated 155 (H) 0 - 99 mg/dL  Thyroid Panel With TSH  Result Value Ref Range   TSH 4.160 0.450 - 4.500 uIU/mL   T4, Total 6.7 4.5 - 12.0 ug/dL   T3 Uptake Ratio 21 (L) 24 - 39 %   Free Thyroxine Index 1.4 1.2 - 4.9  Vitamin B12  Result Value Ref Range   Vitamin B-12 290 232 - 1,245 pg/mL      Assessment & Plan:   Problem List Items Addressed This Visit      Other   Close Exposure to Covid-19 Virus - Primary    Testing ordered. Have recommended maintaining a 14 day at home quarantine at this time, if testing returns negative and no symptoms present may discontinued quarantine, but if testing positive must maintain quarantine.  Patient agrees with this POC and is able to verbalize back.  They will return in 2 weeks for follow-up.      Relevant Orders   Novel Coronavirus, NAA ([facility])      I discussed the assessment and treatment plan with the patient. The patient was provided an opportunity to ask questions and all were answered. The patient agreed with the plan and demonstrated an understanding of the instructions.   The patient was advised to call back or seek an in-person evaluation if the symptoms worsen or if the condition fails to improve as anticipated.   I provided 15 minutes of time during this encounter.  Follow up plan: Return in about 2 weeks (around 08/01/2019) for Covid follow-up.

## 2019-07-18 NOTE — Patient Instructions (Signed)

## 2019-07-18 NOTE — Assessment & Plan Note (Signed)
Testing ordered. Have recommended maintaining a 14 day at home quarantine at this time, if testing returns negative and no symptoms present may discontinued quarantine, but if testing positive must maintain quarantine.  Patient agrees with this POC and is able to verbalize back.  They will return in 2 weeks for follow-up.

## 2019-07-19 ENCOUNTER — Other Ambulatory Visit: Payer: Self-pay

## 2019-07-19 DIAGNOSIS — Z20822 Contact with and (suspected) exposure to covid-19: Secondary | ICD-10-CM

## 2019-07-19 DIAGNOSIS — R6889 Other general symptoms and signs: Secondary | ICD-10-CM | POA: Diagnosis not present

## 2019-07-21 LAB — NOVEL CORONAVIRUS, NAA: SARS-CoV-2, NAA: NOT DETECTED

## 2019-08-03 ENCOUNTER — Other Ambulatory Visit: Payer: Self-pay

## 2019-08-03 ENCOUNTER — Ambulatory Visit (INDEPENDENT_AMBULATORY_CARE_PROVIDER_SITE_OTHER): Payer: Medicare Other | Admitting: Nurse Practitioner

## 2019-08-03 ENCOUNTER — Encounter: Payer: Self-pay | Admitting: Nurse Practitioner

## 2019-08-03 DIAGNOSIS — Z20828 Contact with and (suspected) exposure to other viral communicable diseases: Secondary | ICD-10-CM | POA: Diagnosis not present

## 2019-08-03 DIAGNOSIS — Z20822 Contact with and (suspected) exposure to covid-19: Secondary | ICD-10-CM

## 2019-08-03 NOTE — Assessment & Plan Note (Signed)
Covid testing returned negative, she did maintain a 14 day quarantine for safety purposes.  No current symptoms.  Have recommended she can return to daily ADLs, but to maintain guidelines (wear mask when out, wash hands, and maintain social distancing).  If symptoms present she is to notify provider immediately and return to strict quarantine.

## 2019-08-03 NOTE — Patient Instructions (Signed)

## 2019-08-03 NOTE — Progress Notes (Signed)
LMP  (LMP Unknown)    Subjective:    Patient ID: Lisa Bass, female    DOB: October 02, 1951, 68 y.o.   MRN: [id number]  HPI: Lisa Bass is a 68 y.o. female  Chief Complaint  Patient presents with  . Follow-up    pt states she was around her son in law who tested positive over the weekend     . This visit was completed via telephone due to the restrictions of the COVID-19 pandemic. All issues as above were discussed and addressed but no physical exam was performed. If it was felt that the patient should be evaluated in the office, they were directed there. The patient verbally consented to this visit. Patient was unable to complete an audio/visual visit due to telephone only. Due to the catastrophic nature of the COVID-19 pandemic, this visit was done through audio contact only. . Location of the patient: home . Location of the provider: home . Those involved with this call:  . Provider: [name], DNP . CMA: Yvonna Alanis, CMA . Front Desk/Registration: Jill Side  . Time spent on call: 15 minutes on the phone discussing health concerns. 10 minutes total spent in review of patient's record and preparation of their chart.  . I verified patient identity using two factors (patient name and date of birth). Patient consents verbally to being seen via telemedicine visit today.    COVID FOLLOW-UP: Was tested for Covid on 07/19/2019, which returned negative.  She had been around her son-in-law who was positive on testing. She reports no current symptoms.  States her son-in-law tested positive again on Sunday, after he started having symptoms return.  Reports she had not been in close proximity to him and they had all been self quarantining.    At this time she continues to report no symptoms and overall feeling well.  Denies fever, sore throat, SOB, loss of taste/smell, cough, or sinus congestion.  Relevant past medical, surgical, family and social history reviewed and updated as  indicated. Interim medical history since our last visit reviewed. Allergies and medications reviewed and updated.  Review of Systems  Constitutional: Negative for activity change, appetite change, chills, diaphoresis, fatigue and fever.  HENT: Negative.   Respiratory: Negative for cough, chest tightness, shortness of breath and wheezing.   Cardiovascular: Negative for chest pain, palpitations and leg swelling.  Gastrointestinal: Negative for abdominal distention, abdominal pain, constipation, diarrhea, nausea and vomiting.  Musculoskeletal: Negative for myalgias.  Neurological: Negative for dizziness, syncope, weakness, light-headedness, numbness and headaches.  Psychiatric/Behavioral: Negative.     Per HPI unless specifically indicated above     Objective:    LMP  (LMP Unknown)   Wt Readings from Last 3 Encounters:  07/18/19 180 lb (81.6 kg)  06/27/19 182 lb (82.6 kg)  05/29/19 181 lb (82.1 kg)    Physical Exam   Unable to perform due to telephone visit only  Results for orders placed or performed in visit on 07/19/19  Novel Coronavirus, NAA ([facility])  Result Value Ref Range   SARS-CoV-2, NAA Not Detected Not Detected      Assessment & Plan:   Problem List Items Addressed This Visit      Other   Close Exposure to Covid-19 Virus    Covid testing returned negative, she did maintain a 14 day quarantine for safety purposes.  No current symptoms.  Have recommended she can return to daily ADLs, but to maintain guidelines (wear mask when out, wash hands, and  maintain social distancing).  If symptoms present she is to notify provider immediately and return to strict quarantine.         I discussed the assessment and treatment plan with the patient. The patient was provided an opportunity to ask questions and all were answered. The patient agreed with the plan and demonstrated an understanding of the instructions.   The patient was advised to call back or seek an in-person  evaluation if the symptoms worsen or if the condition fails to improve as anticipated.   I provided 15 minutes of time during this encounter.  Follow up plan: Return if symptoms worsen or fail to improve.

## 2019-08-11 ENCOUNTER — Ambulatory Visit (INDEPENDENT_AMBULATORY_CARE_PROVIDER_SITE_OTHER): Payer: Medicare Other | Admitting: Pharmacist

## 2019-08-11 DIAGNOSIS — E782 Mixed hyperlipidemia: Secondary | ICD-10-CM | POA: Diagnosis not present

## 2019-08-11 DIAGNOSIS — I10 Essential (primary) hypertension: Secondary | ICD-10-CM

## 2019-08-11 NOTE — Patient Instructions (Signed)
Visit Information  Goals Addressed            This Visit's Progress     Patient Stated   . "I have a lot of medications" (pt-stated)       Current Barriers:  . Polypharmacy, and patient confusion about her medication regimen, purpose of each agent, monitoring parameters . At last office visit with [facility], atorvastatin stopped and switched to rosuvastatin; patient had started having diarrhea since starting atorvastatin.  . Patient confused today about which statin medication she is supposed to be on. . Today, she notes diarrhea has stopped; denies any tolerability concerns with rosuvastatin TIW o HTN: losartan/HCTZ 100-25 mg, amlodipine 5 mg; continues to check regularly; takes both medications midday o Depression: paroxetine 20 mg QAM o CV Risk Reduction: rosuvastatin 20 mg TIW, aspirin 81 mg QPM o Osteopenia: Vitamin D3, multivitamin o Low B12 - Vitamin B12 1000 units daily   Pharmacist Clinical Goal(s):  Marland Kitchen Over the next 90 days, patient will work with PharmD and primary care provider to address needs related to optimized medication management  Interventions: . Congratulated patient on continued commitment to medication adherence.  . After medication review, patient noted she was going to throw away the atorvastatin so that she didn't get confused. Agreed with this . Encouraged to continue to check BP regularly and document.  . Patient due for f/u with Jolene Cannady s/p rosuvastatin start and BP med adjustment. Will collaborate with Jolene to see if she wants virtual vs physical appointment (though likely will want to check lipid panel), and collaborate with office staff for outreaching the patient to schedule   Patient Self Care Activities:  . Self administers medications as prescribed . Calls provider office for new concerns or questions  Please see past updates related to this goal by clicking on the "Past Updates" button in the selected goal         The patient  verbalized understanding of instructions provided today and declined a print copy of patient instruction materials.   Plan:  - Will outreach patient in 4-5 weeks for continued medication management support  Catie Darnelle Maffucci, PharmD Clinical Pharmacist [place] [phone]

## 2019-08-11 NOTE — Chronic Care Management (AMB) (Signed)
Chronic Care Management   Follow Up Note   08/11/2019 Name: Lisa Bass MRN: [id number] DOB: 11-04-1951  Referred by: Venita Lick, NP Reason for referral : Chronic Care Management (Medication Management)   Lisa Bass is a 68 y.o. year old female who is a primary care patient of Cannady, Barbaraann Faster, NP. The CCM team was consulted for assistance with chronic disease management and care coordination needs.   Contacted patient to follow up on medication management.   Review of patient status, including review of consultants reports, relevant laboratory and other test results, and collaboration with appropriate care team members and the patient's provider was performed as part of comprehensive patient evaluation and provision of chronic care management services.    SDOH (Social Determinants of Health) screening performed today: None. See Care Plan for related entries.   Outpatient Encounter Medications as of 08/11/2019  Medication Sig Note  . amLODipine (NORVASC) 5 MG tablet Take 1 tablet (5 mg total) by mouth daily. 08/11/2019: Midday  . aspirin 81 MG tablet Take 81 mg by mouth daily. 06/20/2019: QAM  . cholecalciferol (VITAMIN D3) 25 MCG (1000 UT) tablet Take 1,000 Units by mouth daily.   . Cyanocobalamin (VITAMIN B 12 PO) Take by mouth daily.   Marland Kitchen losartan-hydrochlorothiazide (HYZAAR) 100-25 MG tablet Take 1 tablet by mouth daily.   . Multiple Vitamin (MULTIVITAMIN) tablet Take 1 tablet by mouth daily.   Marland Kitchen PARoxetine (PAXIL) 40 MG tablet Take 0.5 tablets (20 mg total) by mouth daily. Take 1/2 dose   . rosuvastatin (CRESTOR) 10 MG tablet Take one tablet (10 MG) on Monday, Wednesday, and Friday at bedtime.   . [DISCONTINUED] Calcium Carb-Cholecalciferol (CALCIUM 1000 + D PO) Take by mouth daily.     No facility-administered encounter medications on file as of 08/11/2019.      Goals Addressed            This Visit's Progress     Patient Stated   . "I have a lot of  medications" (pt-stated)       Current Barriers:  . Polypharmacy, and patient confusion about her medication regimen, purpose of each agent, monitoring parameters . At last office visit with [facility], atorvastatin stopped and switched to rosuvastatin; patient had started having diarrhea since starting atorvastatin.  . Patient confused today about which statin medication she is supposed to be on. . Today, she notes diarrhea has stopped; denies any tolerability concerns with rosuvastatin TIW o HTN: losartan/HCTZ 100-25 mg, amlodipine 5 mg; continues to check regularly; takes both medications midday o Depression: paroxetine 20 mg QAM o CV Risk Reduction: rosuvastatin 20 mg TIW, aspirin 81 mg QPM o Osteopenia: Vitamin D3, multivitamin o Low B12 - Vitamin B12 1000 units daily   Pharmacist Clinical Goal(s):  Marland Kitchen Over the next 90 days, patient will work with PharmD and primary care provider to address needs related to optimized medication management  Interventions: . Congratulated patient on continued commitment to medication adherence.  . After medication review, patient noted she was going to throw away the atorvastatin so that she didn't get confused. Agreed with this . Encouraged to continue to check BP regularly and document.  . Patient due for f/u with Jolene Cannady s/p rosuvastatin start and BP med adjustment. Will collaborate with Jolene to see if she wants virtual vs physical appointment (though likely will want to check lipid panel), and collaborate with office staff for outreaching the patient to schedule   Patient Self Care Activities:  .  Self administers medications as prescribed . Calls provider office for new concerns or questions  Please see past updates related to this goal by clicking on the "Past Updates" button in the selected goal         Plan:  - Will outreach patient in 4-5 weeks for continued medication management support  Catie Darnelle Maffucci, PharmD Clinical  Pharmacist [place] [phone]

## 2019-09-19 ENCOUNTER — Ambulatory Visit: Payer: Self-pay | Admitting: Pharmacist

## 2019-09-19 ENCOUNTER — Telehealth: Payer: Self-pay

## 2019-09-19 NOTE — Chronic Care Management (AMB) (Signed)
  Chronic Care Management   Note  09/19/2019 Name: BAYLEIGH LASPISA MRN: [id number] DOB: 01/30/1951  LORAIN ZENS is a 68 y.o. year old female who is a primary care patient of Cannady, Barbaraann Faster, NP. The CCM team was consulted for assistance with chronic disease management and care coordination needs.    Attempted to contact patient for medication management support; left HIPAA compliant message for patient to return my call at her convenience.   Reviewed fill histories - patient is up to date on fills for rosuvastatin, losartan/HCTZ, and paroxetine, but appears to be behind on amlodipine - last filled 04/25/2019 for a 90 day supply, so she should have refilled in early August.   Plan:  - If I do not hear back, will plan to outreach patient again in the next 4-6 weeks for continued medication management support  Catie Darnelle Maffucci, PharmD Clinical Pharmacist [place] [phone]

## 2019-10-05 ENCOUNTER — Other Ambulatory Visit: Payer: Self-pay

## 2019-10-05 NOTE — Patient Outreach (Signed)
[facility]) Care Management  10/05/2019  HARVETTA SOHMER [birth year] [id number]   Medication Adherence call to Mrs. Sandra Cockayne HIPPA Compliant Voice message left with a call back number. Mrs. Bushell is showing past due on Rosuvastatin 10 mg under [place].   [place] Management Direct Dial [phone]  Fax [phone] [email or web address]

## 2019-10-20 ENCOUNTER — Ambulatory Visit: Payer: Self-pay | Admitting: Pharmacist

## 2019-10-20 ENCOUNTER — Telehealth: Payer: Self-pay

## 2019-10-20 NOTE — Chronic Care Management (AMB) (Signed)
  Chronic Care Management   Note  10/20/2019 Name: Lisa Bass MRN: [id number] DOB: 1951-07-08  Lisa Bass is a 68 y.o. year old female who is a primary care patient of Cannady, Barbaraann Faster, NP. The CCM team was consulted for assistance with chronic disease management and care coordination needs.    Attempted to contact patient to discuss medication management concerns. Left HIPAA compliant message for patient to return my call at her convenience.   Follow up plan: - If I do not hear back, will outreach patient again in the next 4-6 weeks  Catie Darnelle Maffucci, PharmD Clinical Pharmacist [place] [phone]

## 2019-11-22 ENCOUNTER — Ambulatory Visit: Payer: Self-pay | Admitting: Pharmacist

## 2019-11-22 ENCOUNTER — Telehealth: Payer: Self-pay

## 2019-11-22 NOTE — Chronic Care Management (AMB) (Signed)
  Chronic Care Management   Note  11/22/2019 Name: Lisa Bass MRN: [id number] DOB: Aug 31, 1951  Lisa Bass is a 68 y.o. year old female who is a primary care patient of Cannady, Barbaraann Faster, NP. The CCM team was consulted for assistance with chronic disease management and care coordination needs.    Third consecutive outreach attempt to patient to discuss medication management needs. Left HIPAA compliant message for patient to return my call if she was still interested in working together.   Follow up plan: - CCM team will be happy to re-engage with patient in the future with a new referral or if she returns my call  Catie Darnelle Maffucci, PharmD, [place] [phone]

## 2019-12-25 ENCOUNTER — Telehealth: Payer: Self-pay | Admitting: Nurse Practitioner

## 2019-12-25 NOTE — Chronic Care Management (AMB) (Signed)
  Care Management   Note  12/25/2019 Name: Lisa Bass MRN: [id number] DOB: 1951-08-11  [name] is a 69 y.o. year old female who is a primary care patient of Venita Lick, NP and is actively engaged with the care management team. I reached out to [name] by phone today to assist with re-scheduling a follow up appointment with the Pharmacist  Follow up plan: The care management team will reach out to the patient again over the next 7 days. , A HIPPA compliant phone message was left for the patient providing contact information and requesting a return call.  and If patient returns call to provider office, please advise to call [place] at [place], [place] Management  [place], [place] [address] Direct Dial: [email or web address]  Website: [email or web address]

## 2020-01-03 NOTE — Chronic Care Management (AMB) (Signed)
  Care Management   Note  01/03/2020 Name: Lisa Bass MRN: [id number] DOB: 11-07-1951  [name] is a 69 y.o. year old female who is a primary care patient of Venita Lick, NP and is actively engaged with the care management team. I reached out to [name] by phone today to assist with re-scheduling a follow up appointment with the Pharmacist  Follow up plan: The care management team will reach out to the patient again over the next 7 days. , A HIPPA compliant phone message was left for the patient providing contact information and requesting a return call.  and If patient returns call to provider office, please advise to call [place] at [place], [place] [facility]  [place], [place] [address] Direct Dial: [email or web address]  Website: [email or web address]

## 2020-01-11 NOTE — Chronic Care Management (AMB) (Signed)
  Care Management   Note  01/11/2020 Name: Lisa Bass MRN: [id number] DOB: Mar 23, 1951  Lisa Bass is a 69 y.o. year old female who is a primary care patient of Venita Lick, NP and is actively engaged with the care management team. I reached out to [name] by phone today to assist with re-scheduling a follow up visit with the Pharmacist  Follow up plan: Unable to make contact on outreach attempts x 3. PCP Lenard Lance, NP notified via routed documentation in medical record.  The care management team is available to follow up with the patient after provider conversation with the patient regarding recommendation for care management engagement and subsequent re-referral to the care management team.   [place], [place], [place] [address] Direct Dial: [email or web address]  Website: [email or web address]

## 2020-04-18 ENCOUNTER — Telehealth: Payer: Self-pay | Admitting: Nurse Practitioner

## 2020-04-18 NOTE — Telephone Encounter (Signed)
lvm to make this apt.  

## 2020-04-18 NOTE — Telephone Encounter (Signed)
-----   Message from Venita Lick, NP sent at 04/18/2020  7:24 AM EDT ----- In need for physical or office visit, whichever patient prefers, just noted she has not been seen since July and last physical was March 2020.  Thanks

## 2020-04-22 NOTE — Telephone Encounter (Signed)
Lvm to make this apt. 

## 2020-04-23 ENCOUNTER — Encounter: Payer: Self-pay | Admitting: Nurse Practitioner

## 2020-04-23 NOTE — Telephone Encounter (Signed)
Lvm to make this apt. Sent letter.  

## 2020-04-28 ENCOUNTER — Emergency Department: Payer: Medicare Other

## 2020-04-28 ENCOUNTER — Encounter: Payer: Self-pay | Admitting: Emergency Medicine

## 2020-04-28 ENCOUNTER — Other Ambulatory Visit: Payer: Self-pay

## 2020-04-28 ENCOUNTER — Emergency Department
Admission: EM | Admit: 2020-04-28 | Discharge: 2020-04-28 | Disposition: A | Discharge status: Home / Self Care | Payer: Medicare Other | Attending: Emergency Medicine | Admitting: Emergency Medicine

## 2020-04-28 DIAGNOSIS — R112 Nausea with vomiting, unspecified: Secondary | ICD-10-CM | POA: Insufficient documentation

## 2020-04-28 DIAGNOSIS — R000 Tachycardia, unspecified: Secondary | ICD-10-CM | POA: Diagnosis not present

## 2020-04-28 DIAGNOSIS — Z20822 Contact with and (suspected) exposure to covid-19: Secondary | ICD-10-CM | POA: Insufficient documentation

## 2020-04-28 DIAGNOSIS — R1110 Vomiting, unspecified: Secondary | ICD-10-CM | POA: Diagnosis not present

## 2020-04-28 DIAGNOSIS — Z7982 Long term (current) use of aspirin: Secondary | ICD-10-CM | POA: Diagnosis not present

## 2020-04-28 DIAGNOSIS — Z79899 Other long term (current) drug therapy: Secondary | ICD-10-CM | POA: Insufficient documentation

## 2020-04-28 DIAGNOSIS — Z8616 Personal history of COVID-19: Secondary | ICD-10-CM | POA: Diagnosis not present

## 2020-04-28 DIAGNOSIS — R109 Unspecified abdominal pain: Secondary | ICD-10-CM | POA: Diagnosis not present

## 2020-04-28 DIAGNOSIS — I509 Heart failure, unspecified: Secondary | ICD-10-CM | POA: Insufficient documentation

## 2020-04-28 DIAGNOSIS — R42 Dizziness and giddiness: Secondary | ICD-10-CM | POA: Diagnosis not present

## 2020-04-28 DIAGNOSIS — R110 Nausea: Secondary | ICD-10-CM | POA: Diagnosis not present

## 2020-04-28 DIAGNOSIS — R0902 Hypoxemia: Secondary | ICD-10-CM | POA: Diagnosis not present

## 2020-04-28 DIAGNOSIS — I110 Hypertensive heart disease with heart failure: Secondary | ICD-10-CM | POA: Insufficient documentation

## 2020-04-28 DIAGNOSIS — R531 Weakness: Secondary | ICD-10-CM | POA: Diagnosis not present

## 2020-04-28 DIAGNOSIS — J690 Pneumonitis due to inhalation of food and vomit: Secondary | ICD-10-CM | POA: Diagnosis not present

## 2020-04-28 DIAGNOSIS — R05 Cough: Secondary | ICD-10-CM | POA: Diagnosis not present

## 2020-04-28 DIAGNOSIS — Z743 Need for continuous supervision: Secondary | ICD-10-CM | POA: Diagnosis not present

## 2020-04-28 LAB — URINALYSIS, COMPLETE (UACMP) WITH MICROSCOPIC
Bilirubin Urine: NEGATIVE
Glucose, UA: NEGATIVE mg/dL
Hgb urine dipstick: NEGATIVE
Ketones, ur: 5 mg/dL — AB
Nitrite: NEGATIVE
Protein, ur: NEGATIVE mg/dL
Specific Gravity, Urine: >1.046 — ABNORMAL HIGH (ref 1.005–1.030)
pH: 6 (ref 5.0–8.0)

## 2020-04-28 LAB — COMPREHENSIVE METABOLIC PANEL
ALT: 21 U/L (ref 0–44)
AST: 26 U/L (ref 15–41)
Albumin: 4 g/dL (ref 3.5–5.0)
Alkaline Phosphatase: 104 U/L (ref 38–126)
Anion gap: 9 (ref 5–15)
BUN: 14 mg/dL (ref 8–23)
CO2: 25 mmol/L (ref 22–32)
Calcium: 9.3 mg/dL (ref 8.9–10.3)
Chloride: 106 mmol/L (ref 98–111)
Creatinine, Ser: 1.13 mg/dL — ABNORMAL HIGH (ref 0.44–1.00)
GFR calc Af Amer: 57 mL/min — ABNORMAL LOW (ref >60)
GFR calc non Af Amer: 50 mL/min — ABNORMAL LOW (ref >60)
Glucose, Bld: 132 mg/dL — ABNORMAL HIGH (ref 70–99)
Potassium: 3.7 mmol/L (ref 3.5–5.1)
Sodium: 140 mmol/L (ref 135–145)
Total Bilirubin: 0.9 mg/dL (ref 0.3–1.2)
Total Protein: 7.6 g/dL (ref 6.5–8.1)

## 2020-04-28 LAB — CBC WITH DIFFERENTIAL/PLATELET
Abs Immature Granulocytes: 0.07 10*3/uL (ref 0.00–0.07)
Basophils Absolute: 0.1 10*3/uL (ref 0.0–0.1)
Basophils Relative: 1 %
Eosinophils Absolute: 0.2 10*3/uL (ref 0.0–0.5)
Eosinophils Relative: 3 %
HCT: 39.3 % (ref 36.0–46.0)
Hemoglobin: 13 g/dL (ref 12.0–15.0)
Immature Granulocytes: 1 %
Lymphocytes Relative: 30 %
Lymphs Abs: 2.6 10*3/uL (ref 0.7–4.0)
MCH: 27.5 pg (ref 26.0–34.0)
MCHC: 33.1 g/dL (ref 30.0–36.0)
MCV: 83.1 fL (ref 80.0–100.0)
Monocytes Absolute: 0.5 10*3/uL (ref 0.1–1.0)
Monocytes Relative: 6 %
Neutro Abs: 5.3 10*3/uL (ref 1.7–7.7)
Neutrophils Relative %: 59 %
Platelets: 253 10*3/uL (ref 150–400)
RBC: 4.73 MIL/uL (ref 3.87–5.11)
RDW: 13.2 % (ref 11.5–15.5)
WBC: 8.9 10*3/uL (ref 4.0–10.5)
nRBC: 0 % (ref 0.0–0.2)

## 2020-04-28 LAB — MAGNESIUM: Magnesium: 2.3 mg/dL (ref 1.7–2.4)

## 2020-04-28 LAB — TSH: TSH: 5.964 u[IU]/mL — ABNORMAL HIGH (ref 0.350–4.500)

## 2020-04-28 LAB — RESPIRATORY PANEL BY RT PCR (FLU A&B, COVID)
Influenza A by PCR: NEGATIVE
Influenza B by PCR: NEGATIVE
SARS Coronavirus 2 by RT PCR: NEGATIVE

## 2020-04-28 LAB — TROPONIN I (HIGH SENSITIVITY)
Troponin I (High Sensitivity): 3 ng/L (ref <18)
Troponin I (High Sensitivity): 4 ng/L (ref <18)

## 2020-04-28 LAB — LIPASE, BLOOD: Lipase: 37 U/L (ref 11–51)

## 2020-04-28 LAB — T4, FREE: Free T4: 0.92 ng/dL (ref 0.61–1.12)

## 2020-04-28 MED ORDER — IOHEXOL 300 MG/ML  SOLN
100.0000 mL | Freq: Once | INTRAMUSCULAR | Status: AC | PRN
  Administered 2020-04-28: 100 mL via INTRAVENOUS

## 2020-04-28 MED ORDER — ONDANSETRON 4 MG PO TBDP: 4.0000 mg | ORAL_TABLET | Freq: Three times a day (TID) | ORAL | 0 refills | Status: DC | PRN

## 2020-04-28 MED ORDER — CLINDAMYCIN HCL 300 MG PO CAPS: 300.0000 mg | ORAL_CAPSULE | Freq: Three times a day (TID) | ORAL | 0 refills | Status: AC

## 2020-04-28 MED ORDER — ONDANSETRON HCL 4 MG/2ML IJ SOLN
4.0000 mg | Freq: Once | INTRAMUSCULAR | Status: AC
  Administered 2020-04-28: 4 mg via INTRAVENOUS
  Filled 2020-04-28: qty 2

## 2020-04-28 MED ORDER — SODIUM CHLORIDE 0.9 % IV BOLUS
1000.0000 mL | Freq: Once | INTRAVENOUS | Status: AC
  Administered 2020-04-28: 1000 mL via INTRAVENOUS

## 2020-04-28 NOTE — ED Provider Notes (Signed)
[facility] Emergency Department Provider Note  ____________________________________________   First MD Initiated Contact with Patient 04/28/20 1226     (approximate)  I have reviewed the triage vital signs and the nursing notes.   HISTORY  Chief Complaint Nausea    HPI Lisa Bass is a 69 y.o. female with CHF on diuretics, depression, hyperlipidemia who comes in from church for nausea and vomiting.  Patient stated that while she was at church she started feeling hot all over.  She walked outside and sat down.  She started having some nonnausea vomiting is nonbloody nonbilious.  Patient was orthostatic with EMS.  Patient reports feeling weak and slightly nauseous right now, constant, nothing makes better, nothing makes it worse.  Stated that she felt fine this morning.  Initially patient was denied any abdominal pain.          Past Medical History:  Diagnosis Date  . Allergy   . Asthma   . Cataract   . CHF (congestive heart failure) ([place])   . Depression   . Hyperlipidemia   . Hypertension   . OCD (obsessive compulsive disorder)   . Palpitations   . Perimenopause     Patient Active Problem List   Diagnosis Date Noted  . Close exposure to COVID-19 virus 07/18/2019  . Elevated TSH 02/23/2019  . Lipoma of neck 09/21/2017  . B12 deficiency 09/21/2017  . Prediabetes 09/21/2017  . Mild cognitive impairment 09/15/2017  . Positive colorectal cancer screening using Cologuard test 08/02/2017  . Tremor 07/27/2017  . Myxoma of heart 09/23/2015  . Benign neoplasm of heart 09/23/2015  . Major depression, chronic 08/09/2015  . OCD (obsessive compulsive disorder) 08/08/2015  . Hypertension 08/08/2015  . Hyperlipidemia 08/08/2015    Past Surgical History:  Procedure Laterality Date  . CHOLECYSTECTOMY    . COLONOSCOPY WITH PROPOFOL N/A 09/07/2017   Procedure: COLONOSCOPY WITH PROPOFOL;  Surgeon: [name], MD;  Location: [facility] ENDOSCOPY;   Service: Endoscopy;  Laterality: N/A;  . open heart surgery    . ROOT CANAL     x2    Prior to Admission medications   Medication Sig Start Date End Date Taking? Authorizing Provider  amLODipine (NORVASC) 5 MG tablet Take 1 tablet (5 mg total) by mouth daily. 04/25/19   [name] T, NP  aspirin 81 MG tablet Take 81 mg by mouth daily.    [provider]  cholecalciferol (VITAMIN D3) 25 MCG (1000 UT) tablet Take 1,000 Units by mouth daily.    [provider]  Cyanocobalamin (VITAMIN B 12 PO) Take by mouth daily.    [provider]  losartan-hydrochlorothiazide (HYZAAR) 100-25 MG tablet Take 1 tablet by mouth daily. 02/22/19   [name] T, NP  Multiple Vitamin (MULTIVITAMIN) tablet Take 1 tablet by mouth daily.    [provider]  PARoxetine (PAXIL) 40 MG tablet Take 0.5 tablets (20 mg total) by mouth daily. Take 1/2 dose 02/22/19   [name] T, NP  rosuvastatin (CRESTOR) 10 MG tablet Take one tablet (10 MG) on Monday, Wednesday, and Friday at bedtime. 06/27/19   [name] T, NP    Allergies Penicillins, Biaxin [clarithromycin], and Lisinopril  Family History  Problem Relation Age of Onset  . Hypertension Mother   . Dementia Father   . Hypertension Father   . Diabetes Father   . Depression Father   . GI problems Daughter   . Heart disease Maternal Grandfather   . Depression Sister  Social History Social History   Tobacco Use  . Smoking status: Never Smoker  . Smokeless tobacco: Never Used  Substance Use Topics  . Alcohol use: No    Alcohol/week: 0.0 standard drinks  . Drug use: No      Review of Systems Constitutional: No fever/chills positive lightheadedness Eyes: No visual changes. ENT: No sore throat. Cardiovascular: Denies chest pain. Respiratory: Denies shortness of breath. Gastrointestinal: No abdominal pain.  Positive nausea and vomiting no diarrhea.  No constipation. Genitourinary: Negative for  dysuria. Musculoskeletal: Negative for back pain. Skin: Negative for rash. Neurological: Negative for headaches, focal weakness or numbness. All other ROS negative ____________________________________________   PHYSICAL EXAM:  VITAL SIGNS: ED Triage Vitals  Enc Vitals Group     BP 04/28/20 1233 132/81     Pulse Rate 04/28/20 1233 71     Resp 04/28/20 1233 16     Temp 04/28/20 1233 97.6 F (36.4 C)     Temp Source 04/28/20 1233 Oral     SpO2 04/28/20 1233 92 %     Weight 04/28/20 1236 180 lb (81.6 kg)     Height 04/28/20 1236 5\' 2"  (1.575 m)     Head Circumference --      Peak Flow --      Pain Score 04/28/20 1235 0     Pain Loc --      Pain Edu? --      Excl. in [place]? --     Constitutional: Alert and oriented. Well appearing and in no acute distress. Eyes: Conjunctivae are normal. EOMI. Head: Atraumatic. Nose: No congestion/rhinnorhea. Mouth/Throat: Mucous membranes are moist.   Neck: No stridor. Trachea Midline. FROM Cardiovascular: Normal rate, regular rhythm. Grossly normal heart sounds.  Good peripheral circulation. Respiratory: Normal respiratory effort.  No retractions. Lungs CTAB. Gastrointestinal: Soft and nontender. No distention. No abdominal bruits.  Musculoskeletal: No lower extremity tenderness nor edema.  No joint effusions. Neurologic:  Normal speech and language. No gross focal neurologic deficits are appreciated.  Skin:  Skin is warm, dry and intact. No rash noted. Psychiatric: Mood and affect are normal. Speech and behavior are normal. GU: Deferred   ____________________________________________   LABS (all labs ordered are listed, but only abnormal results are displayed)  Labs Reviewed  COMPREHENSIVE METABOLIC PANEL - Abnormal; Notable for the following components:      Result Value   Glucose, Bld 132 (*)    Creatinine, Ser 1.13 (*)    GFR calc non Af Amer 50 (*)    GFR calc Af Amer 57 (*)    All other components within normal limits  TSH -  Abnormal; Notable for the following components:   TSH 5.964 (*)    All other components within normal limits  RESPIRATORY PANEL BY RT PCR (FLU A&B, COVID)  CBC WITH DIFFERENTIAL/PLATELET  LIPASE, BLOOD  MAGNESIUM  T4, FREE  URINALYSIS, COMPLETE (UACMP) WITH MICROSCOPIC  TROPONIN I (HIGH SENSITIVITY)  TROPONIN I (HIGH SENSITIVITY)   ____________________________________________   ED ECG REPORT I, Vanessa , the attending physician, personally viewed and interpreted this ECG.  EKG is sinus rate of 72, no ST elevation, no T wave inversions, normal intervals ____________________________________________  RADIOLOGY Robert Bellow, personally viewed and evaluated these images (plain radiographs) as part of my medical decision making, as well as reviewing the written report by the radiologist.  ED MD interpretation: Subtle opacities in the right lung base Official radiology report(s): DG Chest Portable 1 View  Result  Date: 04/28/2020 CLINICAL DATA:  Cough. Additional history provided: New onset nausea today, near syncope, productive cough for 2 days, history of asthma and CHF. EXAM: PORTABLE CHEST 1 VIEW COMPARISON:  Chest CT 12/03/2007, chest radiograph 12/02/2007 FINDINGS: Prior median sternotomy. Unchanged cardiomegaly. There is central pulmonary vascular congestion. There are subtle interstitial opacities at the right lung base. The left lung is clear. No evidence of pleural effusion or pneumothorax. No acute bony abnormality identified. IMPRESSION: Cardiomegaly with central pulmonary vascular congestion. Subtle interstitial opacities at the right lung base which may reflect atelectasis or interstitial edema. Atypical/viral pneumonia is difficult to exclude. Electronically Signed   By: Kellie Simmering DO   On: 04/28/2020 14:20    ____________________________________________   PROCEDURES  Procedure(s) performed (including Critical  Care):  Procedures   ____________________________________________   INITIAL IMPRESSION / ASSESSMENT AND PLAN / ED COURSE  SUKHLEEN KIESER was evaluated in Emergency Department on 04/28/2020 for the symptoms described in the history of present illness. She was evaluated in the context of the global COVID-19 pandemic, which necessitated consideration that the patient might be at risk for infection with the SARS-CoV-2 virus that causes COVID-19. Institutional protocols and algorithms that pertain to the evaluation of patients at risk for COVID-19 are in a state of rapid change based on information released by regulatory bodies including the CDC and federal and state organizations. These policies and algorithms were followed during the patient's care in the ED.    Patient is a 69 year old who comes in with some nausea and vomiting.  Patient was in church when this happened.  Possibly vasovagal in nature.  Will get labs to evaluate for Electra abnormalities, AKI, UTI.  Patient's abdomen exam at this time appears to be benign but will continue to closely monitor.   Patient stated that after the vomiting she started to have some coughing.  Chest x-ray concerning for possible atelectasis in the right lower lung recommended Covid testing.  Patient never had Covid has not been vaccinated.  Will send Covid testing.  Patient ambulatory sat was normal.  Patient does report 1 week of cough congestion feeling like her sinuses have been plugged up.  Discussed with patient that we cover her with a course of Augmentin for possible prolonged sinusitis versus possible aspiration pneumonia.  Patient felt comfortable with this plan.  Patient now reporting more distention in her abdomen and is feeling some continued nausea.  Patient has had significant abdominal surgeries in the past.  Will get CT scan to make sure no evidence of SBO.   Patient handed off to oncoming team pending CT scan.  If negative and patient is feeling  better suspect patient will be discharged home  Patient has allergy to Augmentin so will give some clindamycin for possible aspiration      ____________________________________________   FINAL CLINICAL IMPRESSION(S) / ED DIAGNOSES   Final diagnoses:  Nausea and vomiting, intractability of vomiting not specified, unspecified vomiting type  Aspiration pneumonia, unspecified aspiration pneumonia type, unspecified laterality, unspecified part of lung ([place])      MEDICATIONS GIVEN DURING THIS VISIT:  Medications  iohexol (OMNIPAQUE) 300 MG/ML solution 100 mL (has no administration in time range)  sodium chloride 0.9 % bolus 1,000 mL (0 mLs Intravenous Stopped 04/28/20 1417)  ondansetron (ZOFRAN) injection 4 mg (4 mg Intravenous Given 04/28/20 1249)     ED Discharge Orders         Ordered    clindamycin (CLEOCIN) 300 MG capsule  3 times  daily     04/28/20 1528           Note:  This document was prepared using Dragon voice recognition software and may include unintentional dictation errors.   Vanessa [place], MD 04/28/20 [phone]

## 2020-04-28 NOTE — ED Triage Notes (Signed)
Pt via EMS from church. Pt c/o sudden onset of feeling hot and NV. States one episode of vomiting. Per EMS, pt did vomit en route. Per EMS, pt initial BP 157/65 but upon standing pt dropped to 97/58. On arrival, pt A&Ox4 and NAD, denies pain. Just states she is mildly nauseous and feels weak.

## 2020-04-28 NOTE — ED Provider Notes (Signed)
-----------------------------------------   5:55 PM on 04/28/2020 -----------------------------------------  CT scan essentially negative.  Covid test has resulted negative.  Given the questionable pneumonia with signs of sinusitis we will cover with a course of Augmentin per Dr. Maryelizabeth Kaufmann discharge instructions.  Patient was finally able to provide a urine sample.  We will send a urinalysis.  If normal we will discharge home.  Patient agreeable plan of care.  Feels well currently.  Urinalysis is negative for acute abnormality.  We will discharge.   Harvest Dark, MD 04/28/20 [phone]

## 2020-04-28 NOTE — Discharge Instructions (Addendum)
We have prescribed you some antibiotics for possible pneumonia.  Your Covid test is pending.  IMPRESSION: Cardiomegaly with central pulmonary vascular congestion.   Subtle interstitial opacities at the right lung base which may reflect atelectasis or interstitial edema. Atypical/viral pneumonia is difficult to exclude.

## 2020-04-28 NOTE — ED Notes (Signed)
Pt soiled pants, given clean pants, linen and pre-moistened wipes.  No acute changets noted.  Updated on plan of care/verbalized understanding.  Awaiting dispo.  Will continue to monitor/reassess.

## 2020-04-29 ENCOUNTER — Telehealth: Payer: Self-pay | Admitting: Nurse Practitioner

## 2020-04-29 NOTE — Telephone Encounter (Signed)
lvm to make this hosp f.u apt.

## 2020-04-29 NOTE — Telephone Encounter (Signed)
-----   Message from Venita Lick, NP sent at 04/29/2020  7:59 AM EDT ----- Needs ER follow-up

## 2020-05-03 ENCOUNTER — Other Ambulatory Visit: Payer: Self-pay

## 2020-05-03 ENCOUNTER — Encounter: Payer: Self-pay | Admitting: Emergency Medicine

## 2020-05-03 ENCOUNTER — Ambulatory Visit: Payer: Self-pay | Admitting: *Deleted

## 2020-05-03 ENCOUNTER — Ambulatory Visit
Admission: EM | Admit: 2020-05-03 | Discharge: 2020-05-03 | Disposition: A | Discharge status: Home / Self Care | Payer: Medicare Other | Attending: Family Medicine | Admitting: Family Medicine

## 2020-05-03 ENCOUNTER — Ambulatory Visit: Payer: Medicare Other | Admitting: Family Medicine

## 2020-05-03 DIAGNOSIS — J40 Bronchitis, not specified as acute or chronic: Secondary | ICD-10-CM

## 2020-05-03 NOTE — Telephone Encounter (Signed)
Called pt and relayed Jolene's message, pt verbalized understanding by saying ok

## 2020-05-03 NOTE — Telephone Encounter (Signed)
Please advise 

## 2020-05-03 NOTE — ED Triage Notes (Signed)
Patient in today c/o cough and nasal congestion x 5 days. Patient states she had syncope at church on 04/28/20 and was taken to [facility] ED. Patient states covid test was negative. CXR and CT abd/pelvis were also done. Patient was prescribed Clindamycin.

## 2020-05-03 NOTE — Telephone Encounter (Signed)
Pt called in c/o wheezing.   She is also coughing up thick clear mucus.   The cough is really bad at night making it difficult to sleep.   No pulmonary history.   She has had a CABG. She went to the ED Sunday because she passed out at church after getting real hot.  EMS brought her in    They could not r/o pneumonia per pt   They sent her home on an antibiotic.  No other meds.  She has a hospital f/u appt with Jolene on Monday but she was concerned about waiting over the weekend since she is wheezing.  [name], NP did not have any openings today.   The COVID-19 questionnaire completed indicating a virtual visit.   She was tested for COVID-19 in ED last Sunday and it was negative.  I'm sending a high priority note to [facility] to see if they can work her in today with Jolene.  She was agreeable to this as she prefers to see Jolene.   I let her know someone would be calling her back after the office opens at 8:00.  She can be reached on her cell number which is correct in the chart.  I let her know to go to the ED if her breathing/symptoms become worse. Protocol is to be seen within 4 hours so note sent high priority.  I sent these notes high priority to the office.   Reason for Disposition . [1] Continuous (nonstop) coughing AND [2] keeps from working or sleeping  Answer Assessment - Initial Assessment Questions 1. RESPIRATORY STATUS: "Describe your breathing?" (e.g., wheezing, shortness of breath, unable to speak, severe coughing)      I'm wheezing this morning and I'm coughing up thick clear mucus.   I went to the ED.   They could not r/o pneumonia.   Can I be seen today? 2. ONSET: "When did this breathing problem begin?"      This has been going on since I sent to the ED.   I cough bad at night.   Sunday is when I was I the ED.   I'm Clindamycin. 3. PATTERN "Does the difficult breathing come and go, or has it been constant since it started?"      Coughing bad at  night. 4. SEVERITY: "How bad is your breathing?" (e.g., mild, moderate, severe)    - MILD: No SOB at rest, mild SOB with walking, speaks normally in sentences, can lay down, no retractions, pulse < 100.    - MODERATE: SOB at rest, SOB with minimal exertion and prefers to sit, cannot lie down flat, speaks in phrases, mild retractions, audible wheezing, pulse 100-120.    - SEVERE: Very SOB at rest, speaks in single words, struggling to breathe, sitting hunched forward, retractions, pulse > 120      Wheezing.    I'm coughing bad at night.  5. RECURRENT SYMPTOM: "Have you had difficulty breathing before?" If so, ask: "When was the last time?" and "What happened that time?"      I was in the ED Sunday because I passed out after church.  EMS took me to the ED. 6. CARDIAC HISTORY: "Do you have any history of heart disease?" (e.g., heart attack, angina, bypass surgery, angioplasty)      Had CABG. 7. LUNG HISTORY: "Do you have any history of lung disease?"  (e.g., pulmonary embolus, asthma, emphysema)     No 8. CAUSE: "What do you think  is causing the breathing problem?"      Maybe pneumonia 9. OTHER SYMPTOMS: "Do you have any other symptoms? (e.g., dizziness, runny nose, cough, chest pain, fever)     Nasal congestion.  It feels different. 10. PREGNANCY: "Is there any chance you are pregnant?" "When was your last menstrual period?"       N/A due to age 81. TRAVEL: "Have you traveled out of the country in the last month?" (e.g., travel history, exposures)       Tested for COVID-19 in ED on Sunday   Negative.  Protocols used: BREATHING DIFFICULTY-A-AH

## 2020-05-03 NOTE — ED Provider Notes (Signed)
[facility]    CSN: [id number] Arrival date & time: 05/03/20  1351      History   Chief Complaint Chief Complaint  Patient presents with  . Cough  . Nasal Congestion    HPI Lisa Bass is a 69 y.o. female.   69 yo female with a c/o cough, congestion and wheezing at night. States she was seen at [facility] ED last Sunday (5 days ago) and told possible pneumonia. States she was given clindamycin and has been taking it. Still coughing and wheezing intermittently at night, however patient states mainly concerned that she doesn't have "sepsis". Overall, however states she's doing better than last Sunday. Denies any fevers, chills, chest pain or shortness of breath. States she doesn't use inhalers because of side effects.    Cough   Past Medical History:  Diagnosis Date  . Allergy   . Asthma   . Cataract   . CHF (congestive heart failure) ([place])   . Depression   . Hyperlipidemia   . Hypertension   . OCD (obsessive compulsive disorder)   . Palpitations   . Perimenopause     Patient Active Problem List   Diagnosis Date Noted  . Close exposure to COVID-19 virus 07/18/2019  . Elevated TSH 02/23/2019  . Lipoma of neck 09/21/2017  . B12 deficiency 09/21/2017  . Prediabetes 09/21/2017  . Mild cognitive impairment 09/15/2017  . Positive colorectal cancer screening using Cologuard test 08/02/2017  . Tremor 07/27/2017  . Myxoma of heart 09/23/2015  . Benign neoplasm of heart 09/23/2015  . Major depression, chronic 08/09/2015  . OCD (obsessive compulsive disorder) 08/08/2015  . Hypertension 08/08/2015  . Hyperlipidemia 08/08/2015    Past Surgical History:  Procedure Laterality Date  . CHOLECYSTECTOMY    . COLONOSCOPY WITH PROPOFOL N/A 09/07/2017   Procedure: COLONOSCOPY WITH PROPOFOL;  Surgeon: [name], MD;  Location: [facility] ENDOSCOPY;  Service: Endoscopy;  Laterality: N/A;  . open heart surgery    . ROOT CANAL     x2    OB History   No obstetric history  on file.      Home Medications    Prior to Admission medications   Medication Sig Start Date End Date Taking? Authorizing Provider  aspirin 81 MG tablet Take 81 mg by mouth daily.   Yes [provider]  clindamycin (CLEOCIN) 300 MG capsule Take 1 capsule (300 mg total) by mouth 3 (three) times daily for 5 days. 04/28/20 05/03/20 Yes Vanessa Winfield, MD  losartan-hydrochlorothiazide (HYZAAR) 100-25 MG tablet Take 1 tablet by mouth daily. 02/22/19  Yes Cannady, Henrine Screws T, NP  Multiple Vitamin (MULTIVITAMIN) tablet Take 1 tablet by mouth daily.   Yes [provider]  ondansetron (ZOFRAN ODT) 4 MG disintegrating tablet Take 1 tablet (4 mg total) by mouth every 8 (eight) hours as needed for nausea or vomiting. 04/28/20  Yes Vanessa Kendall, MD  PARoxetine (PAXIL) 40 MG tablet Take 0.5 tablets (20 mg total) by mouth daily. Take 1/2 dose 02/22/19  Yes Cannady, Jolene T, NP  rosuvastatin (CRESTOR) 10 MG tablet Take one tablet (10 MG) on Monday, Wednesday, and Friday at bedtime. 06/27/19  Yes Cannady, Jolene T, NP  amLODipine (NORVASC) 5 MG tablet Take 1 tablet (5 mg total) by mouth daily. 04/25/19   Cannady, Henrine Screws T, NP  cholecalciferol (VITAMIN D3) 25 MCG (1000 UT) tablet Take 1,000 Units by mouth daily.    [provider]  Cyanocobalamin (VITAMIN B 12 PO) Take by  mouth daily.    [provider]    Family History Family History  Problem Relation Age of Onset  . Hypertension Mother   . Dementia Father   . Hypertension Father   . Diabetes Father   . Depression Father   . GI problems Daughter   . Heart disease Maternal Grandfather   . Depression Sister     Social History Social History   Tobacco Use  . Smoking status: Never Smoker  . Smokeless tobacco: Never Used  Substance Use Topics  . Alcohol use: No    Alcohol/week: 0.0 standard drinks  . Drug use: No     Allergies   Penicillins, Biaxin [clarithromycin], and Lisinopril   Review of Systems Review of  Systems  Respiratory: Positive for cough.      Physical Exam Triage Vital Signs ED Triage Vitals  Enc Vitals Group     BP 05/03/20 1409 (!) 172/86     Pulse Rate 05/03/20 1409 75     Resp 05/03/20 1409 18     Temp 05/03/20 1409 98.1 F (36.7 C)     Temp Source 05/03/20 1409 Oral     SpO2 05/03/20 1409 100 %     Weight 05/03/20 1409 175 lb (79.4 kg)     Height 05/03/20 1409 5\' 1"  (1.549 m)     Head Circumference --      Peak Flow --      Pain Score 05/03/20 1408 0     Pain Loc --      Pain Edu? --      Excl. in [place]? --    No data found.  Updated Vital Signs BP (!) 172/86 (BP Location: Left Arm)   Pulse 75   Temp 98.1 F (36.7 C) (Oral)   Resp 18   Ht 5\' 1"  (1.549 m)   Wt 79.4 kg   LMP  (LMP Unknown)   SpO2 100%   BMI 33.07 kg/m   Visual Acuity Right Eye Distance:   Left Eye Distance:   Bilateral Distance:    Right Eye Near:   Left Eye Near:    Bilateral Near:     Physical Exam Vitals and nursing note reviewed.  Constitutional:      General: She is not in acute distress.    Appearance: She is not toxic-appearing or diaphoretic.  HENT:     Right Ear: Tympanic membrane normal.     Left Ear: Tympanic membrane normal.     Nose: No congestion or rhinorrhea.  Cardiovascular:     Heart sounds: Normal heart sounds.  Pulmonary:     Effort: Pulmonary effort is normal. No respiratory distress.     Breath sounds: Normal breath sounds. No stridor. No wheezing, rhonchi or rales.  Musculoskeletal:     Cervical back: Normal range of motion and neck supple.  Skin:    Findings: No rash.  Neurological:     Mental Status: She is alert.      UC Treatments / Results  Labs (all labs ordered are listed, but only abnormal results are displayed) Labs Reviewed - No data to display  EKG   Radiology No results found.  Procedures Procedures (including critical care time)  Medications Ordered in UC Medications - No data to display  Initial Impression /  Assessment and Plan / UC Course  I have reviewed the triage vital signs and the nursing notes.  Pertinent labs & imaging results that were available during my care of the  patient were reviewed by me and considered in my medical decision making (see chart for details).      Final Clinical Impressions(s) / UC Diagnoses   Final diagnoses:  Bronchitis     Discharge Instructions     Continue current antibiotic and other current medications Follow up on Monday with PCP as scheduled    ED Prescriptions    None      1. diagnosis reviewed with patient; reassured patient 2. Recommend continue current medications 3. Follow-up prn if symptoms worsen or don't improve  PDMP not reviewed this encounter.   Norval Gable, MD 05/03/20 1513

## 2020-05-03 NOTE — Telephone Encounter (Signed)
Do to her current symptoms I recommend she head to urgent care or ER today for further imaging and labs.

## 2020-05-03 NOTE — Discharge Instructions (Addendum)
Continue current antibiotic and other current medications Follow up on Monday with PCP as scheduled

## 2020-05-06 ENCOUNTER — Ambulatory Visit: Payer: Medicare Other

## 2020-05-06 ENCOUNTER — Telehealth: Payer: Self-pay

## 2020-05-06 ENCOUNTER — Ambulatory Visit: Payer: Medicare Other | Admitting: Nurse Practitioner

## 2020-05-06 NOTE — Telephone Encounter (Signed)
Called pt to scheduled appt per [facility], she states that she will need to check with her husband about transportation being that she is unable to drive. Pt states that she will contact us back to schedule.

## 2020-05-06 NOTE — Telephone Encounter (Signed)
Thank you Lisa Bass.  Admin crew can you please call patient and alert her provider would like follow-up in office this week to see how she is doing, this is important to ensure improvement after her recent acute bronchitis.

## 2020-05-06 NOTE — Telephone Encounter (Signed)
Called patient to see if she still wanted to complete her AWV today as her appt with Jolene was cancelled and they were scheduled for same day.  Patient states she did not cancel her appt and was told by ED to follow up today. Informed her I could put her back on Jolene's schedule and she declined stating she did not want any appts today that this is the 2nd time this has happened to her and she wanted all her appts cancelled. Offered reschedule for AWV, she declined that as well.  Will Fwd to [facility] for review.

## 2020-05-08 ENCOUNTER — Encounter: Payer: Self-pay | Admitting: Nurse Practitioner

## 2020-05-08 ENCOUNTER — Ambulatory Visit (INDEPENDENT_AMBULATORY_CARE_PROVIDER_SITE_OTHER): Payer: Medicare Other | Admitting: Nurse Practitioner

## 2020-05-08 ENCOUNTER — Other Ambulatory Visit: Payer: Self-pay

## 2020-05-08 VITALS — BP 136/83 | HR 79 | Temp 98.1°F | Ht 61.02 in | Wt 181.2 lb

## 2020-05-08 DIAGNOSIS — I10 Essential (primary) hypertension: Secondary | ICD-10-CM

## 2020-05-08 DIAGNOSIS — F329 Major depressive disorder, single episode, unspecified: Secondary | ICD-10-CM

## 2020-05-08 DIAGNOSIS — R7303 Prediabetes: Secondary | ICD-10-CM | POA: Diagnosis not present

## 2020-05-08 DIAGNOSIS — R7989 Other specified abnormal findings of blood chemistry: Secondary | ICD-10-CM

## 2020-05-08 DIAGNOSIS — E782 Mixed hyperlipidemia: Secondary | ICD-10-CM | POA: Diagnosis not present

## 2020-05-08 DIAGNOSIS — J129 Viral pneumonia, unspecified: Secondary | ICD-10-CM | POA: Diagnosis not present

## 2020-05-08 MED ORDER — PAROXETINE HCL 40 MG PO TABS: 20.0000 mg | ORAL_TABLET | Freq: Every day | ORAL | 4 refills | Status: DC

## 2020-05-08 NOTE — Assessment & Plan Note (Signed)
Chronic, stable with initial BP slightly elevated and repeat closer to goal.  Continue current medication regimen and adjust as needed.  Recommend continue to monitor BP daily at home.  Will check CMP on outpatient labs.  Recommend focus on DASH diet and modest weight loss.

## 2020-05-08 NOTE — Assessment & Plan Note (Signed)
Chronic, ongoing.  Continue Crestor 10 MG Monday/Wednesday/Friday schedule.  Obtain outpatient fasting lipid panel.  Plan to return in 6 months for follow-up.

## 2020-05-08 NOTE — Progress Notes (Signed)
BP 136/83 (BP Location: Left Arm)   Pulse 79   Temp 98.1 F (36.7 C) (Oral)   Ht 5' 1.02" (1.55 m)   Wt 181 lb 3.2 oz (82.2 kg)   LMP  (LMP Unknown)   SpO2 97%   BMI 34.21 kg/m    Subjective:    Patient ID: Lisa Bass, female    DOB: 1951/01/15, 69 y.o.   MRN: [id number]  HPI: Lisa Bass is a 69 y.o. female  Chief Complaint  Patient presents with  . ER follow up   ER FOLLOW UP Seen 04/28/2020 in ER for nausea and vomiting + passed out at church.  Was sent home with Clindamycin, CT abdomen was normal and CXR imaging showed "difficult to exclude atypical/viral pneumonia" -- Covid testing was negative.  Then saw urgent care on 05/03/20 and no changes made, recommended she continue Clindamycin.  Denies SOB, CP, cough, fever.  Reports overall feeling better at this time. Time since discharge: 04/28/20 Hospital/facility: [facility] Diagnosis: nausea and vomiting Procedures/tests: CT scan abdomen, CXR, labs -- TSH was slightly elevated 5.964, T4 0.92, GFR 50, CRT 1.13, Glucose 132 Consultants: none New medications: Clindamycin Discharge instructions:  Follow-up with PCP Status: better  HYPERTENSION / HYPERLIPIDEMIA Continues on Losartan-HCTZ and Amlodipine, continues on Rosuvastatin 10 MG three days a week -- just ate chicken nuggets and french fries. Satisfied with current treatment? yes Duration of hypertension: chronic BP monitoring frequency: not checking BP range:  BP medication side effects: no Duration of hyperlipidemia: chronic Cholesterol medication side effects: no Cholesterol supplements: none Medication compliance: good compliance Aspirin: yes Recent stressors: no Recurrent headaches: no Visual changes: no Palpitations: no Dyspnea: no Chest pain: no Lower extremity edema: no Dizzy/lightheaded: no   ELEVATED TSH Recent labs in ER noted TSH 5.964 and T4 0.92. Has not had A1C checked since March 2020 and at that time was 5.9%.  She reports having some recent  hair loss she is concerned about. Fatigue: no Cold intolerance: no Heat intolerance: no Weight gain: no Weight loss: no Constipation: no Diarrhea/loose stools: no Palpitations: no Lower extremity edema: no Anxiety/depressed mood: no  DEPRESSION Continues on Paxil 40 MG Mood status: stable Satisfied with current treatment?: yes Symptom severity: mild  Duration of current treatment : chronic Side effects: no Medication compliance: good compliance Psychotherapy/counseling: none Depressed mood: no Anxious mood: no Anhedonia: no Significant weight loss or gain: no Insomnia: none Fatigue: no Feelings of worthlessness or guilt: no Impaired concentration/indecisiveness: no Suicidal ideations: no Hopelessness: no Crying spells: no Depression screen [facility] 2/9 05/08/2020 02/22/2019 02/13/2019 02/11/2018 01/14/2018  Decreased Interest 1 0 0 0 0  Down, Depressed, Hopeless 0 0 0 0 1  PHQ - 2 Score 1 0 0 0 1  Altered sleeping 0 1 - 0 0  Tired, decreased energy 0 0 - 0 0  Change in appetite 0 1 - 0 0  Feeling bad or failure about yourself  0 1 - 0 0  Trouble concentrating 0 0 - 0 0  Moving slowly or fidgety/restless 0 1 - 0 1  Suicidal thoughts 0 0 - 0 0  PHQ-9 Score 1 4 - 0 2  Difficult doing work/chores Not difficult at all Not difficult at all - - -  Some recent data might be hidden    Relevant past medical, surgical, family and social history reviewed and updated as indicated. Interim medical history since our last visit reviewed. Allergies and medications reviewed and updated.  Review of  Systems  Constitutional: Negative for activity change, appetite change, chills, diaphoresis, fatigue and fever.  HENT: Negative.   Respiratory: Negative for cough, chest tightness, shortness of breath and wheezing.   Cardiovascular: Negative for chest pain, palpitations and leg swelling.  Gastrointestinal: Negative.   Neurological: Negative.   Psychiatric/Behavioral: Negative.     Per HPI  unless specifically indicated above     Objective:    BP 136/83 (BP Location: Left Arm)   Pulse 79   Temp 98.1 F (36.7 C) (Oral)   Ht 5' 1.02" (1.55 m)   Wt 181 lb 3.2 oz (82.2 kg)   LMP  (LMP Unknown)   SpO2 97%   BMI 34.21 kg/m   Wt Readings from Last 3 Encounters:  05/08/20 181 lb 3.2 oz (82.2 kg)  05/03/20 175 lb (79.4 kg)  04/28/20 180 lb (81.6 kg)    Physical Exam Vitals and nursing note reviewed.  Constitutional:      General: She is awake. She is not in acute distress.    Appearance: She is well-developed and well-groomed. She is obese. She is not ill-appearing.  HENT:     Head: Normocephalic.     Comments: Slight diminished hair pattern scalp.    Right Ear: Hearing normal.     Left Ear: Hearing normal.  Eyes:     General: Lids are normal.        Right eye: No discharge.        Left eye: No discharge.     Conjunctiva/sclera: Conjunctivae normal.     Pupils: Pupils are equal, round, and reactive to light.  Neck:     Thyroid: No thyromegaly.     Vascular: No carotid bruit.  Cardiovascular:     Rate and Rhythm: Normal rate and regular rhythm.     Heart sounds: Normal heart sounds. No murmur. No gallop.   Pulmonary:     Effort: Pulmonary effort is normal. No accessory muscle usage or respiratory distress.     Breath sounds: Normal breath sounds.  Abdominal:     General: Bowel sounds are normal.     Palpations: Abdomen is soft.  Musculoskeletal:     Cervical back: Normal range of motion and neck supple.     Right lower leg: No edema.     Left lower leg: No edema.  Skin:    General: Skin is warm and dry.  Neurological:     Mental Status: She is alert and oriented to person, place, and time.  Psychiatric:        Attention and Perception: Attention normal.        Mood and Affect: Mood normal.        Speech: Speech normal.        Behavior: Behavior normal. Behavior is cooperative.        Thought Content: Thought content normal.    Results for orders  placed or performed during the hospital encounter of 04/28/20  Respiratory Panel by RT PCR (Flu A&B, Covid) - Nasopharyngeal Swab   Specimen: Nasopharyngeal Swab  Result Value Ref Range   SARS Coronavirus 2 by RT PCR NEGATIVE NEGATIVE   Influenza A by PCR NEGATIVE NEGATIVE   Influenza B by PCR NEGATIVE NEGATIVE  CBC with Differential  Result Value Ref Range   WBC 8.9 4.0 - 10.5 K/uL   RBC 4.73 3.87 - 5.11 MIL/uL   Hemoglobin 13.0 12.0 - 15.0 g/dL   HCT 39.3 36.0 - 46.0 %   MCV 83.1 80.0 -  100.0 fL   MCH 27.5 26.0 - 34.0 pg   MCHC 33.1 30.0 - 36.0 g/dL   RDW 13.2 11.5 - 15.5 %   Platelets 253 150 - 400 K/uL   nRBC 0.0 0.0 - 0.2 %   Neutrophils Relative % 59 %   Neutro Abs 5.3 1.7 - 7.7 K/uL   Lymphocytes Relative 30 %   Lymphs Abs 2.6 0.7 - 4.0 K/uL   Monocytes Relative 6 %   Monocytes Absolute 0.5 0.1 - 1.0 K/uL   Eosinophils Relative 3 %   Eosinophils Absolute 0.2 0.0 - 0.5 K/uL   Basophils Relative 1 %   Basophils Absolute 0.1 0.0 - 0.1 K/uL   Immature Granulocytes 1 %   Abs Immature Granulocytes 0.07 0.00 - 0.07 K/uL  Comprehensive metabolic panel  Result Value Ref Range   Sodium 140 135 - 145 mmol/L   Potassium 3.7 3.5 - 5.1 mmol/L   Chloride 106 98 - 111 mmol/L   CO2 25 22 - 32 mmol/L   Glucose, Bld 132 (H) 70 - 99 mg/dL   BUN 14 8 - 23 mg/dL   Creatinine, Ser 1.13 (H) 0.44 - 1.00 mg/dL   Calcium 9.3 8.9 - 10.3 mg/dL   Total Protein 7.6 6.5 - 8.1 g/dL   Albumin 4.0 3.5 - 5.0 g/dL   AST 26 15 - 41 U/L   ALT 21 0 - 44 U/L   Alkaline Phosphatase 104 38 - 126 U/L   Total Bilirubin 0.9 0.3 - 1.2 mg/dL   GFR calc non Af Amer 50 (L) >60 mL/min   GFR calc Af Amer 57 (L) >60 mL/min   Anion gap 9 5 - 15  Lipase, blood  Result Value Ref Range   Lipase 37 11 - 51 U/L  Magnesium  Result Value Ref Range   Magnesium 2.3 1.7 - 2.4 mg/dL  TSH  Result Value Ref Range   TSH 5.964 (H) 0.350 - 4.500 uIU/mL  T4, free  Result Value Ref Range   Free T4 0.92 0.61 - 1.12 ng/dL   Urinalysis, Complete w Microscopic  Result Value Ref Range   Color, Urine YELLOW (A) YELLOW   APPearance CLEAR (A) CLEAR   Specific Gravity, Urine >1.046 (H) 1.005 - 1.030   pH 6.0 5.0 - 8.0   Glucose, UA NEGATIVE NEGATIVE mg/dL   Hgb urine dipstick NEGATIVE NEGATIVE   Bilirubin Urine NEGATIVE NEGATIVE   Ketones, ur 5 (A) NEGATIVE mg/dL   Protein, ur NEGATIVE NEGATIVE mg/dL   Nitrite NEGATIVE NEGATIVE   Leukocytes,Ua TRACE (A) NEGATIVE   RBC / HPF 0-5 0 - 5 RBC/hpf   WBC, UA 0-5 0 - 5 WBC/hpf   Bacteria, UA RARE (A) NONE SEEN   Squamous Epithelial / LPF 6-10 0 - 5  Troponin I (High Sensitivity)  Result Value Ref Range   Troponin I (High Sensitivity) 3 <18 ng/L  Troponin I (High Sensitivity)  Result Value Ref Range   Troponin I (High Sensitivity) 4 <18 ng/L      Assessment & Plan:   Problem List Items Addressed This Visit      Cardiovascular and Mediastinum   Hypertension    Chronic, stable with initial BP slightly elevated and repeat closer to goal.  Continue current medication regimen and adjust as needed.  Recommend continue to monitor BP daily at home.  Will check CMP on outpatient labs.  Recommend focus on DASH diet and modest weight loss.  Relevant Orders   Comprehensive metabolic panel   Microalbumin, Urine Waived   Thyroid Panel With TSH     Respiratory   Pneumonia, viral - Primary    Treated recently with Clindamycin with improvement in symptoms at this time.  Plan on repeat CXR in 6 weeks, around June 17th, instructed patient on this and where to have performed.  Provided written information on address.  She was able to verbalize this plan back and will return to office if worsening symptoms.  Obtain outpatient CBC.      Relevant Orders   CBC with Differential/Platelet   DG Chest 2 View     Other   Hyperlipidemia    Chronic, ongoing.  Continue Crestor 10 MG Monday/Wednesday/Friday schedule.  Obtain outpatient fasting lipid panel.  Plan to return in 6  months for follow-up.      Relevant Orders   Lipid Panel Piccolo, Waived   Major depression, chronic    Chronic, well controlled. Continue current medication regimen and adjust as needed.  Due to age would benefit from change to Zoloft or alternate SSRI in future and discontinuation of Paxil.  Refills sent. Follow-up in 6 months.       Relevant Medications   PARoxetine (PAXIL) 40 MG tablet   Prediabetes    Labs outpatient. Focus on diet and activity/exercise. Will assess need for further interventions after labs resulted.      Relevant Orders   Bayer DCA Hb A1c Waived   Elevated TSH    Recheck thyroid panel today and initiate medication as needed.      Relevant Orders   Thyroid Panel With TSH       Follow up plan: Return in about 6 months (around 11/08/2020) for HTN/HLD, Prediabetes, MOOD --- also needs a 6 week lab visit only.

## 2020-05-08 NOTE — Patient Instructions (Addendum)
IMAGING === [address], [place], [place] [address] obtain chest x-ray on June 17th    DASH Eating Plan DASH stands for "Dietary Approaches to Stop Hypertension." The DASH eating plan is a healthy eating plan that has been shown to reduce high blood pressure (hypertension). It may also reduce your risk for type 2 diabetes, heart disease, and stroke. The DASH eating plan may also help with weight loss. What are tips for following this plan?  General guidelines  Avoid eating more than 2,300 mg (milligrams) of salt (sodium) a day. If you have hypertension, you may need to reduce your sodium intake to 1,500 mg a day.  Limit alcohol intake to no more than 1 drink a day for nonpregnant women and 2 drinks a day for men. One drink equals 12 oz of beer, 5 oz of wine, or 1 oz of hard liquor.  Work with your health care provider to maintain a healthy body weight or to lose weight. Ask what an ideal weight is for you.  Get at least 30 minutes of exercise that causes your heart to beat faster (aerobic exercise) most days of the week. Activities may include walking, swimming, or biking.  Work with your health care provider or diet and nutrition specialist (dietitian) to adjust your eating plan to your individual calorie needs. Reading food labels   Check food labels for the amount of sodium per serving. Choose foods with less than 5 percent of the Daily Value of sodium. Generally, foods with less than 300 mg of sodium per serving fit into this eating plan.  To find whole grains, look for the word "whole" as the first word in the ingredient list. Shopping  Buy products labeled as "low-sodium" or "no salt added."  Buy fresh foods. Avoid canned foods and premade or frozen meals. Cooking  Avoid adding salt when cooking. Use salt-free seasonings or herbs instead of table salt or sea salt. Check with your health care provider or pharmacist before using salt substitutes.  Do not fry foods. Cook foods using  healthy methods such as baking, boiling, grilling, and broiling instead.  Cook with heart-healthy oils, such as olive, canola, soybean, or sunflower oil. Meal planning  Eat a balanced diet that includes: ? 5 or more servings of fruits and vegetables each day. At each meal, try to fill half of your plate with fruits and vegetables. ? Up to 6-8 servings of whole grains each day. ? Less than 6 oz of lean meat, poultry, or fish each day. A 3-oz serving of meat is about the same size as a deck of cards. One egg equals 1 oz. ? 2 servings of low-fat dairy each day. ? A serving of nuts, seeds, or beans 5 times each week. ? Heart-healthy fats. Healthy fats called Omega-3 fatty acids are found in foods such as flaxseeds and coldwater fish, like sardines, salmon, and mackerel.  Limit how much you eat of the following: ? Canned or prepackaged foods. ? Food that is high in trans fat, such as fried foods. ? Food that is high in saturated fat, such as fatty meat. ? Sweets, desserts, sugary drinks, and other foods with added sugar. ? Full-fat dairy products.  Do not salt foods before eating.  Try to eat at least 2 vegetarian meals each week.  Eat more home-cooked food and less restaurant, buffet, and fast food.  When eating at a restaurant, ask that your food be prepared with less salt or no salt, if possible.  What foods are recommended? The items listed may not be a complete list. Talk with your dietitian about what dietary choices are best for you. Grains Whole-grain or whole-wheat bread. Whole-grain or whole-wheat pasta. Brown rice. [place]. Bulgur. Whole-grain and low-sodium cereals. Pita bread. Low-fat, low-sodium crackers. Whole-wheat flour tortillas. Vegetables Fresh or frozen vegetables (raw, steamed, roasted, or grilled). Low-sodium or reduced-sodium tomato and vegetable juice. Low-sodium or reduced-sodium tomato sauce and tomato paste. Low-sodium or reduced-sodium canned  vegetables. Fruits All fresh, dried, or frozen fruit. Canned fruit in natural juice (without added sugar). Meat and other protein foods Skinless chicken or [place]. Ground chicken or [place]. Pork with fat trimmed off. Fish and seafood. Egg whites. Dried beans, peas, or lentils. Unsalted nuts, nut butters, and seeds. Unsalted canned beans. Lean cuts of beef with fat trimmed off. Low-sodium, lean deli meat. Dairy Low-fat (1%) or fat-free (skim) milk. Fat-free, low-fat, or reduced-fat cheeses. Nonfat, low-sodium ricotta or cottage cheese. Low-fat or nonfat yogurt. Low-fat, low-sodium cheese. Fats and oils Soft margarine without trans fats. Vegetable oil. Low-fat, reduced-fat, or light mayonnaise and salad dressings (reduced-sodium). Canola, safflower, olive, soybean, and sunflower oils. Avocado. Seasoning and other foods Herbs. Spices. Seasoning mixes without salt. Unsalted popcorn and pretzels. Fat-free sweets. What foods are not recommended? The items listed may not be a complete list. Talk with your dietitian about what dietary choices are best for you. Grains Baked goods made with fat, such as croissants, muffins, or some breads. Dry pasta or rice meal packs. Vegetables Creamed or fried vegetables. Vegetables in a cheese sauce. Regular canned vegetables (not low-sodium or reduced-sodium). Regular canned tomato sauce and paste (not low-sodium or reduced-sodium). Regular tomato and vegetable juice (not low-sodium or reduced-sodium). Angie Fava. Olives. Fruits Canned fruit in a light or heavy syrup. Fried fruit. Fruit in cream or butter sauce. Meat and other protein foods Fatty cuts of meat. Ribs. Fried meat. Berniece Salines. Sausage. Bologna and other processed lunch meats. Salami. Fatback. Hotdogs. Bratwurst. Salted nuts and seeds. Canned beans with added salt. Canned or smoked fish. Whole eggs or egg yolks. Chicken or [place] with skin. Dairy Whole or 2% milk, cream, and half-and-half. Whole or full-fat  cream cheese. Whole-fat or sweetened yogurt. Full-fat cheese. Nondairy creamers. Whipped toppings. Processed cheese and cheese spreads. Fats and oils Butter. Stick margarine. Lard. Shortening. Ghee. Bacon fat. Tropical oils, such as coconut, palm kernel, or palm oil. Seasoning and other foods Salted popcorn and pretzels. Onion salt, garlic salt, seasoned salt, table salt, and sea salt. Worcestershire sauce. Tartar sauce. Barbecue sauce. Teriyaki sauce. Soy sauce, including reduced-sodium. Steak sauce. Canned and packaged gravies. Fish sauce. Oyster sauce. Cocktail sauce. Horseradish that you find on the shelf. Ketchup. Mustard. Meat flavorings and tenderizers. Bouillon cubes. Hot sauce and Tabasco sauce. Premade or packaged marinades. Premade or packaged taco seasonings. Relishes. Regular salad dressings. Where to find more information:  National Heart, Lung, and [place]: https://wilson-eaton.com/  American Heart Association: www.heart.org Summary  The DASH eating plan is a healthy eating plan that has been shown to reduce high blood pressure (hypertension). It may also reduce your risk for type 2 diabetes, heart disease, and stroke.  With the DASH eating plan, you should limit salt (sodium) intake to 2,300 mg a day. If you have hypertension, you may need to reduce your sodium intake to 1,500 mg a day.  When on the DASH eating plan, aim to eat more fresh fruits and vegetables, whole grains, lean proteins, low-fat dairy, and heart-healthy fats.  Work  with your health care provider or diet and nutrition specialist (dietitian) to adjust your eating plan to your individual calorie needs. This information is not intended to replace advice given to you by your health care provider. Make sure you discuss any questions you have with your health care provider. Document Revised: 11/19/2017 Document Reviewed: 11/30/2016 Elsevier Patient Education  2020 [employer].

## 2020-05-08 NOTE — Assessment & Plan Note (Signed)
Chronic, well controlled. Continue current medication regimen and adjust as needed.  Due to age would benefit from change to Zoloft or alternate SSRI in future and discontinuation of Paxil.  Refills sent. Follow-up in 6 months.

## 2020-05-08 NOTE — Assessment & Plan Note (Signed)
Labs outpatient. Focus on diet and activity/exercise. Will assess need for further interventions after labs resulted.

## 2020-05-08 NOTE — Assessment & Plan Note (Signed)
Treated recently with Clindamycin with improvement in symptoms at this time.  Plan on repeat CXR in 6 weeks, around June 17th, instructed patient on this and where to have performed.  Provided written information on address.  She was able to verbalize this plan back and will return to office if worsening symptoms.  Obtain outpatient CBC.

## 2020-05-08 NOTE — Assessment & Plan Note (Signed)
Recheck thyroid panel today and initiate medication as needed.

## 2020-05-11 ENCOUNTER — Emergency Department: Payer: Medicare Other

## 2020-05-11 ENCOUNTER — Other Ambulatory Visit: Payer: Self-pay

## 2020-05-11 ENCOUNTER — Emergency Department
Admission: EM | Admit: 2020-05-11 | Discharge: 2020-05-11 | Disposition: A | Discharge status: Home / Self Care | Payer: Medicare Other | Attending: Emergency Medicine | Admitting: Emergency Medicine

## 2020-05-11 DIAGNOSIS — I509 Heart failure, unspecified: Secondary | ICD-10-CM | POA: Insufficient documentation

## 2020-05-11 DIAGNOSIS — Z7982 Long term (current) use of aspirin: Secondary | ICD-10-CM | POA: Diagnosis not present

## 2020-05-11 DIAGNOSIS — Y9389 Activity, other specified: Secondary | ICD-10-CM | POA: Insufficient documentation

## 2020-05-11 DIAGNOSIS — I110 Hypertensive heart disease with heart failure: Secondary | ICD-10-CM | POA: Insufficient documentation

## 2020-05-11 DIAGNOSIS — J45909 Unspecified asthma, uncomplicated: Secondary | ICD-10-CM | POA: Diagnosis not present

## 2020-05-11 DIAGNOSIS — H538 Other visual disturbances: Secondary | ICD-10-CM | POA: Diagnosis not present

## 2020-05-11 DIAGNOSIS — R55 Syncope and collapse: Secondary | ICD-10-CM | POA: Insufficient documentation

## 2020-05-11 DIAGNOSIS — R519 Headache, unspecified: Secondary | ICD-10-CM | POA: Diagnosis not present

## 2020-05-11 DIAGNOSIS — R42 Dizziness and giddiness: Secondary | ICD-10-CM | POA: Diagnosis not present

## 2020-05-11 DIAGNOSIS — I10 Essential (primary) hypertension: Secondary | ICD-10-CM | POA: Diagnosis not present

## 2020-05-11 DIAGNOSIS — Z743 Need for continuous supervision: Secondary | ICD-10-CM | POA: Diagnosis not present

## 2020-05-11 DIAGNOSIS — Z79899 Other long term (current) drug therapy: Secondary | ICD-10-CM | POA: Insufficient documentation

## 2020-05-11 DIAGNOSIS — Y998 Other external cause status: Secondary | ICD-10-CM | POA: Insufficient documentation

## 2020-05-11 DIAGNOSIS — W08XXXA Fall from other furniture, initial encounter: Secondary | ICD-10-CM | POA: Diagnosis not present

## 2020-05-11 DIAGNOSIS — R0902 Hypoxemia: Secondary | ICD-10-CM | POA: Diagnosis not present

## 2020-05-11 DIAGNOSIS — Y9289 Other specified places as the place of occurrence of the external cause: Secondary | ICD-10-CM | POA: Insufficient documentation

## 2020-05-11 DIAGNOSIS — R1111 Vomiting without nausea: Secondary | ICD-10-CM | POA: Diagnosis not present

## 2020-05-11 LAB — COMPREHENSIVE METABOLIC PANEL
ALT: 21 U/L (ref 0–44)
AST: 26 U/L (ref 15–41)
Albumin: 4.2 g/dL (ref 3.5–5.0)
Alkaline Phosphatase: 99 U/L (ref 38–126)
Anion gap: 11 (ref 5–15)
BUN: 14 mg/dL (ref 8–23)
CO2: 27 mmol/L (ref 22–32)
Calcium: 9.7 mg/dL (ref 8.9–10.3)
Chloride: 98 mmol/L (ref 98–111)
Creatinine, Ser: 1.79 mg/dL — ABNORMAL HIGH (ref 0.44–1.00)
GFR calc Af Amer: 33 mL/min — ABNORMAL LOW (ref >60)
GFR calc non Af Amer: 28 mL/min — ABNORMAL LOW (ref >60)
Glucose, Bld: 119 mg/dL — ABNORMAL HIGH (ref 70–99)
Potassium: 3.9 mmol/L (ref 3.5–5.1)
Sodium: 136 mmol/L (ref 135–145)
Total Bilirubin: 0.9 mg/dL (ref 0.3–1.2)
Total Protein: 8.4 g/dL — ABNORMAL HIGH (ref 6.5–8.1)

## 2020-05-11 LAB — CBC
HCT: 40.7 % (ref 36.0–46.0)
Hemoglobin: 13.7 g/dL (ref 12.0–15.0)
MCH: 27.8 pg (ref 26.0–34.0)
MCHC: 33.7 g/dL (ref 30.0–36.0)
MCV: 82.7 fL (ref 80.0–100.0)
Platelets: 304 10*3/uL (ref 150–400)
RBC: 4.92 MIL/uL (ref 3.87–5.11)
RDW: 13.3 % (ref 11.5–15.5)
WBC: 11.4 10*3/uL — ABNORMAL HIGH (ref 4.0–10.5)
nRBC: 0 % (ref 0.0–0.2)

## 2020-05-11 LAB — DIFFERENTIAL
Abs Immature Granulocytes: 0.07 10*3/uL (ref 0.00–0.07)
Basophils Absolute: 0.1 10*3/uL (ref 0.0–0.1)
Basophils Relative: 1 %
Eosinophils Absolute: 0.3 10*3/uL (ref 0.0–0.5)
Eosinophils Relative: 3 %
Immature Granulocytes: 1 %
Lymphocytes Relative: 20 %
Lymphs Abs: 2.3 10*3/uL (ref 0.7–4.0)
Monocytes Absolute: 0.7 10*3/uL (ref 0.1–1.0)
Monocytes Relative: 6 %
Neutro Abs: 7.8 10*3/uL — ABNORMAL HIGH (ref 1.7–7.7)
Neutrophils Relative %: 69 %

## 2020-05-11 LAB — TROPONIN I (HIGH SENSITIVITY)
Troponin I (High Sensitivity): 4 ng/L (ref <18)
Troponin I (High Sensitivity): 3 ng/L (ref <18)

## 2020-05-11 MED ORDER — SODIUM CHLORIDE 0.9 % IV BOLUS
1000.0000 mL | Freq: Once | INTRAVENOUS | Status: AC
  Administered 2020-05-11: 1000 mL via INTRAVENOUS

## 2020-05-11 NOTE — ED Notes (Signed)
Pt able to dress self, gait steady. Pt given dc instructions and wheeled to lobby for discharge.

## 2020-05-11 NOTE — ED Notes (Signed)
Head wound cleaned and dry non stick dressing applied.

## 2020-05-11 NOTE — ED Provider Notes (Signed)
[facility] Emergency Department Provider Note  ____________________________________________   I have reviewed the triage vital signs and the nursing notes.   HISTORY  Chief Complaint Fall, Loss of Consciousness, and Head Injury   History limited by: Not Limited   HPI Lisa Bass is a 69 y.o. female who presents to the emergency department today because of turn for a syncopal episode.  Patient states she was out at some shops with some of her family.  They just eaten lunch.  Is here walking around she started feeling hot.  She states her vision started to get a little blurry.  She sat down on a bench.  At this point it sounds like she did pass out.  She fell forward onto the ground and hit the front of her head.  It is unclear how long she might have been blacked out for.  Patient denies feeling any chest pain or palpitations during this.  She does complain of some headache.  Patient states she had a similar episode a couple of weeks ago and was evaluated in the ER.   Records reviewed. Per medical record review patient has a history of recent ER visit for nausea, vomiting and syncope that [place]. Work up at that time negative save for possible pneumonia.   Past Medical History:  Diagnosis Date  . Allergy   . Asthma   . Cataract   . CHF (congestive heart failure) ([place])   . Depression   . Hyperlipidemia   . Hypertension   . OCD (obsessive compulsive disorder)   . Palpitations   . Perimenopause     Patient Active Problem List   Diagnosis Date Noted  . Pneumonia, viral 05/08/2020  . Elevated TSH 02/23/2019  . Lipoma of neck 09/21/2017  . B12 deficiency 09/21/2017  . Prediabetes 09/21/2017  . Mild cognitive impairment 09/15/2017  . Positive colorectal cancer screening using Cologuard test 08/02/2017  . Tremor 07/27/2017  . Myxoma of heart 09/23/2015  . Benign neoplasm of heart 09/23/2015  . Major depression, chronic 08/09/2015  . OCD  (obsessive compulsive disorder) 08/08/2015  . Hypertension 08/08/2015  . Hyperlipidemia 08/08/2015    Past Surgical History:  Procedure Laterality Date  . CHOLECYSTECTOMY    . COLONOSCOPY WITH PROPOFOL N/A 09/07/2017   Procedure: COLONOSCOPY WITH PROPOFOL;  Surgeon: [name], MD;  Location: [facility] ENDOSCOPY;  Service: Endoscopy;  Laterality: N/A;  . open heart surgery    . ROOT CANAL     x2    Prior to Admission medications   Medication Sig Start Date End Date Taking? Authorizing Provider  amLODipine (NORVASC) 5 MG tablet Take 1 tablet (5 mg total) by mouth daily. 04/25/19   [name] T, NP  aspirin 81 MG tablet Take 81 mg by mouth daily.    [provider]  cholecalciferol (VITAMIN D3) 25 MCG (1000 UT) tablet Take 1,000 Units by mouth daily.    [provider]  Cyanocobalamin (VITAMIN B 12 PO) Take by mouth daily.    [provider]  losartan-hydrochlorothiazide (HYZAAR) 100-25 MG tablet Take 1 tablet by mouth daily. 02/22/19   [name] T, NP  Multiple Vitamin (MULTIVITAMIN) tablet Take 1 tablet by mouth daily.    [provider]  PARoxetine (PAXIL) 40 MG tablet Take 0.5 tablets (20 mg total) by mouth daily. Take 1/2 dose 05/08/20   [name] T, NP  rosuvastatin (CRESTOR) 10 MG tablet Take one tablet (10 MG) on Monday, Wednesday, and  Friday at bedtime. 06/27/19   [name] T, NP    Allergies Penicillins, Biaxin [clarithromycin], and Lisinopril  Family History  Problem Relation Age of Onset  . Hypertension Mother   . Dementia Father   . Hypertension Father   . Diabetes Father   . Depression Father   . GI problems Daughter   . Heart disease Maternal Grandfather   . Depression Sister     Social History Social History   Tobacco Use  . Smoking status: Never Smoker  . Smokeless tobacco: Never Used  Substance Use Topics  . Alcohol use: No    Alcohol/week: 0.0 standard drinks  . Drug use: No    Review of  Systems Constitutional: No fever/chills Eyes: No visual changes. ENT: No sore throat. Cardiovascular: Denies chest pain. Respiratory: Denies shortness of breath. Gastrointestinal: No abdominal pain. Positive for nausea and vomiting.  Genitourinary: Negative for dysuria. Musculoskeletal: Negative for back pain. Skin: Negative for rash. Neurological: Positive for headache. Positive for syncopal episode.  ____________________________________________   PHYSICAL EXAM:  VITAL SIGNS: ED Triage Vitals  Enc Vitals Group     BP 05/11/20 1457 125/65     Pulse Rate 05/11/20 1457 78     Resp 05/11/20 1457 15     Temp 05/11/20 1457 98 F (36.7 C)     Temp Source 05/11/20 1457 Oral     SpO2 05/11/20 1457 95 %     Weight 05/11/20 1449 181 lb 3.1 oz (82.2 kg)     Height 05/11/20 1449 5\' 1"  (1.549 m)     Head Circumference --      Peak Flow --      Pain Score 05/11/20 1449 5   Constitutional: Alert and oriented.  Eyes: Conjunctivae are normal.  ENT      Head: Normocephalic. Abrasions to left forehead.       Nose: No congestion/rhinnorhea.      Mouth/Throat: Mucous membranes are moist.      Neck: No stridor. Hematological/Lymphatic/Immunilogical: No cervical lymphadenopathy. Cardiovascular: Normal rate, regular rhythm.  No murmurs, rubs, or gallops.  Respiratory: Normal respiratory effort without tachypnea nor retractions. Breath sounds are clear and equal bilaterally. No wheezes/rales/rhonchi. Gastrointestinal: Soft and non tender. No rebound. No guarding.  Genitourinary: Deferred Musculoskeletal: Normal range of motion in all extremities. No lower extremity edema. Neurologic:  Normal speech and language. No gross focal neurologic deficits are appreciated.  Skin:  Skin is warm, dry and intact. No rash noted. Psychiatric: Mood and affect are normal. Speech and behavior are normal. Patient exhibits appropriate insight and judgment.  ____________________________________________    LABS  (pertinent positives/negatives)  CBC wbc 11.4, hgb 13.7, plt 304 CMP na 136, k 3.9, glu 119, cr 1.79 Trop hs 4 > 3 ____________________________________________   EKG  I, Nance Pear, attending physician, personally viewed and interpreted this EKG  EKG Time: 1457 Rate: 77 Rhythm: sinus rhythm Axis: normal Intervals: qtc 443 QRS: low voltage precordial leads ST changes: no st elevation Impression: abnormal ekg  ____________________________________________    RADIOLOGY  CT head No acute intracranial abnormality  ____________________________________________   PROCEDURES  Procedures  ____________________________________________   INITIAL IMPRESSION / ASSESSMENT AND PLAN / ED COURSE  Pertinent labs & imaging results that were available during my care of the patient were reviewed by me and considered in my medical decision making (see chart for details).   Patient presented to the emergency department today after a syncopal episode.  Patient's blood work did show slight elevation  of her creatinine which could indicate dehydration.  Troponins were negative x2.  EKG without concerning arrhythmia.  Patient without anemia.  At this time not quite clear etiology.  I do feel however it is safe for patient be discharged home.  I did discuss with patient possibility of intermittent arrhythmias.  Discussed with patient importance of follow-up and possible Holter monitor.  Give patient cardiology follow-up information.   ____________________________________________   FINAL CLINICAL IMPRESSION(S) / ED DIAGNOSES  Final diagnoses:  Syncope, unspecified syncope type     Note: This dictation was prepared with Dragon dictation. Any transcriptional errors that result from this process are unintentional     Nance Pear, MD 05/11/20 [phone]

## 2020-05-11 NOTE — ED Triage Notes (Signed)
Patient arrived via EMS, patient is AOx4, ambulatory. Patient was at [facility] [place] shopping with two other family members when patient began to feel dizzy. Patient sat down on bench, had one episode of emesis, and than had a complete syncopial episode following the emesis. Patient remained unconscious for unknown time.  Patient did hit head and has abrasion on forehead but no other injuries noted at this time.

## 2020-05-13 ENCOUNTER — Telehealth: Payer: Self-pay

## 2020-05-13 NOTE — Telephone Encounter (Signed)
Called and let patient know of Jolene's message.  Patient stated that she is going to see the cardiologist tomorrow. Scheduled a hospital f/u for 05/17/20 at 1:00 pm. Routing to provider as an Kennan

## 2020-05-13 NOTE — Telephone Encounter (Signed)
-----   Message from Venita Lick, NP sent at 05/12/2020  5:22 PM EDT ----- She will need ER follow-up and she missed labs on 05/08/20, was to have them not sure if she left or what, can we schedule ER follow-up and if she refuses then ask if she can present for outpatient labs as would benefit from the repeat TSH and A1C.  Thank you.

## 2020-05-13 NOTE — Telephone Encounter (Signed)
Noted  

## 2020-05-14 ENCOUNTER — Observation Stay: Payer: Medicare Other

## 2020-05-14 ENCOUNTER — Observation Stay
Admission: EM | Admit: 2020-05-14 | Discharge: 2020-05-16 | Disposition: A | Discharge status: Home / Self Care | Payer: Medicare Other | Attending: Hospitalist | Admitting: Hospitalist

## 2020-05-14 ENCOUNTER — Emergency Department: Payer: Medicare Other

## 2020-05-14 ENCOUNTER — Encounter: Payer: Self-pay | Admitting: Emergency Medicine

## 2020-05-14 ENCOUNTER — Emergency Department
Admission: EM | Admit: 2020-05-14 | Discharge: 2020-05-14 | Disposition: A | Discharge status: Home / Self Care | Payer: Medicare Other | Source: Home / Self Care | Attending: Student | Admitting: Student

## 2020-05-14 ENCOUNTER — Other Ambulatory Visit: Payer: Self-pay

## 2020-05-14 DIAGNOSIS — R55 Syncope and collapse: Principal | ICD-10-CM | POA: Diagnosis present

## 2020-05-14 DIAGNOSIS — N179 Acute kidney failure, unspecified: Secondary | ICD-10-CM | POA: Insufficient documentation

## 2020-05-14 DIAGNOSIS — S060X9A Concussion with loss of consciousness of unspecified duration, initial encounter: Secondary | ICD-10-CM | POA: Diagnosis not present

## 2020-05-14 DIAGNOSIS — E782 Mixed hyperlipidemia: Secondary | ICD-10-CM | POA: Diagnosis not present

## 2020-05-14 DIAGNOSIS — R112 Nausea with vomiting, unspecified: Secondary | ICD-10-CM | POA: Insufficient documentation

## 2020-05-14 DIAGNOSIS — I110 Hypertensive heart disease with heart failure: Secondary | ICD-10-CM | POA: Insufficient documentation

## 2020-05-14 DIAGNOSIS — E785 Hyperlipidemia, unspecified: Secondary | ICD-10-CM | POA: Diagnosis not present

## 2020-05-14 DIAGNOSIS — Z880 Allergy status to penicillin: Secondary | ICD-10-CM | POA: Diagnosis not present

## 2020-05-14 DIAGNOSIS — Z888 Allergy status to other drugs, medicaments and biological substances status: Secondary | ICD-10-CM | POA: Insufficient documentation

## 2020-05-14 DIAGNOSIS — Z881 Allergy status to other antibiotic agents status: Secondary | ICD-10-CM | POA: Insufficient documentation

## 2020-05-14 DIAGNOSIS — F329 Major depressive disorder, single episode, unspecified: Secondary | ICD-10-CM | POA: Diagnosis not present

## 2020-05-14 DIAGNOSIS — D219 Benign neoplasm of connective and other soft tissue, unspecified: Secondary | ICD-10-CM | POA: Diagnosis not present

## 2020-05-14 DIAGNOSIS — Z86018 Personal history of other benign neoplasm: Secondary | ICD-10-CM | POA: Diagnosis not present

## 2020-05-14 DIAGNOSIS — R739 Hyperglycemia, unspecified: Secondary | ICD-10-CM | POA: Diagnosis not present

## 2020-05-14 DIAGNOSIS — E669 Obesity, unspecified: Secondary | ICD-10-CM | POA: Insufficient documentation

## 2020-05-14 DIAGNOSIS — R7303 Prediabetes: Secondary | ICD-10-CM | POA: Diagnosis not present

## 2020-05-14 DIAGNOSIS — Z79899 Other long term (current) drug therapy: Secondary | ICD-10-CM | POA: Insufficient documentation

## 2020-05-14 DIAGNOSIS — Z9114 Patient's other noncompliance with medication regimen: Secondary | ICD-10-CM | POA: Insufficient documentation

## 2020-05-14 DIAGNOSIS — Z20822 Contact with and (suspected) exposure to covid-19: Secondary | ICD-10-CM | POA: Diagnosis not present

## 2020-05-14 DIAGNOSIS — D72829 Elevated white blood cell count, unspecified: Secondary | ICD-10-CM | POA: Insufficient documentation

## 2020-05-14 DIAGNOSIS — Z7982 Long term (current) use of aspirin: Secondary | ICD-10-CM | POA: Diagnosis not present

## 2020-05-14 DIAGNOSIS — F429 Obsessive-compulsive disorder, unspecified: Secondary | ICD-10-CM | POA: Insufficient documentation

## 2020-05-14 DIAGNOSIS — I2510 Atherosclerotic heart disease of native coronary artery without angina pectoris: Secondary | ICD-10-CM | POA: Diagnosis not present

## 2020-05-14 DIAGNOSIS — J45909 Unspecified asthma, uncomplicated: Secondary | ICD-10-CM | POA: Diagnosis not present

## 2020-05-14 DIAGNOSIS — I509 Heart failure, unspecified: Secondary | ICD-10-CM | POA: Diagnosis not present

## 2020-05-14 DIAGNOSIS — Z03818 Encounter for observation for suspected exposure to other biological agents ruled out: Secondary | ICD-10-CM | POA: Diagnosis not present

## 2020-05-14 DIAGNOSIS — Z6834 Body mass index (BMI) 34.0-34.9, adult: Secondary | ICD-10-CM | POA: Diagnosis not present

## 2020-05-14 DIAGNOSIS — I6782 Cerebral ischemia: Secondary | ICD-10-CM | POA: Insufficient documentation

## 2020-05-14 DIAGNOSIS — R4020 Unspecified coma: Secondary | ICD-10-CM

## 2020-05-14 DIAGNOSIS — I10 Essential (primary) hypertension: Secondary | ICD-10-CM | POA: Diagnosis not present

## 2020-05-14 LAB — COMPREHENSIVE METABOLIC PANEL
ALT: 28 U/L (ref 0–44)
AST: 34 U/L (ref 15–41)
Albumin: 4.4 g/dL (ref 3.5–5.0)
Alkaline Phosphatase: 95 U/L (ref 38–126)
Anion gap: 10 (ref 5–15)
BUN: 16 mg/dL (ref 8–23)
CO2: 25 mmol/L (ref 22–32)
Calcium: 9.8 mg/dL (ref 8.9–10.3)
Chloride: 103 mmol/L (ref 98–111)
Creatinine, Ser: 1.46 mg/dL — ABNORMAL HIGH (ref 0.44–1.00)
GFR calc Af Amer: 42 mL/min — ABNORMAL LOW (ref >60)
GFR calc non Af Amer: 36 mL/min — ABNORMAL LOW (ref >60)
Glucose, Bld: 142 mg/dL — ABNORMAL HIGH (ref 70–99)
Potassium: 3.6 mmol/L (ref 3.5–5.1)
Sodium: 138 mmol/L (ref 135–145)
Total Bilirubin: 0.9 mg/dL (ref 0.3–1.2)
Total Protein: 8.4 g/dL — ABNORMAL HIGH (ref 6.5–8.1)

## 2020-05-14 LAB — CBC
HCT: 42.6 % (ref 36.0–46.0)
Hemoglobin: 14 g/dL (ref 12.0–15.0)
MCH: 27.9 pg (ref 26.0–34.0)
MCHC: 32.9 g/dL (ref 30.0–36.0)
MCV: 85 fL (ref 80.0–100.0)
Platelets: 355 10*3/uL (ref 150–400)
RBC: 5.01 MIL/uL (ref 3.87–5.11)
RDW: 13.3 % (ref 11.5–15.5)
WBC: 11.5 10*3/uL — ABNORMAL HIGH (ref 4.0–10.5)
nRBC: 0 % (ref 0.0–0.2)

## 2020-05-14 LAB — GLUCOSE, CAPILLARY: Glucose-Capillary: 135 mg/dL — ABNORMAL HIGH (ref 70–99)

## 2020-05-14 LAB — TROPONIN I (HIGH SENSITIVITY)
Troponin I (High Sensitivity): 4 ng/L (ref <18)
Troponin I (High Sensitivity): 4 ng/L (ref <18)

## 2020-05-14 LAB — SARS CORONAVIRUS 2 BY RT PCR (HOSPITAL ORDER, PERFORMED IN ~~LOC~~ HOSPITAL LAB): SARS Coronavirus 2: NEGATIVE

## 2020-05-14 LAB — FIBRIN DERIVATIVES D-DIMER (ARMC ONLY): Fibrin derivatives D-dimer (ARMC): 539.08 ng/mL (FEU) — ABNORMAL HIGH (ref 0.00–499.00)

## 2020-05-14 MED ORDER — ONDANSETRON HCL 4 MG PO TABS: 4.0000 mg | ORAL_TABLET | Freq: Four times a day (QID) | ORAL | Status: DC | PRN

## 2020-05-14 MED ORDER — SODIUM CHLORIDE 0.9% FLUSH
3.0000 mL | Freq: Two times a day (BID) | INTRAVENOUS | Status: DC
  Administered 2020-05-15: 3 mL via INTRAVENOUS

## 2020-05-14 MED ORDER — SODIUM CHLORIDE 0.9 % IV SOLN: INTRAVENOUS | Status: DC

## 2020-05-14 MED ORDER — ONDANSETRON HCL 4 MG/2ML IJ SOLN: 4.0000 mg | Freq: Four times a day (QID) | INTRAMUSCULAR | Status: DC | PRN

## 2020-05-14 MED ORDER — BISACODYL 5 MG PO TBEC: 5.0000 mg | DELAYED_RELEASE_TABLET | Freq: Every day | ORAL | Status: DC | PRN

## 2020-05-14 MED ORDER — IOHEXOL 350 MG/ML SOLN
60.0000 mL | Freq: Once | INTRAVENOUS | Status: AC | PRN
  Administered 2020-05-14: 60 mL via INTRAVENOUS

## 2020-05-14 MED ORDER — SODIUM CHLORIDE 0.9 % IV BOLUS
1000.0000 mL | Freq: Once | INTRAVENOUS | Status: AC
  Administered 2020-05-14: 1000 mL via INTRAVENOUS

## 2020-05-14 MED ORDER — PAROXETINE HCL 20 MG PO TABS
20.0000 mg | ORAL_TABLET | Freq: Every day | ORAL | Status: DC
  Administered 2020-05-14 – 2020-05-16 (×3): 20 mg via ORAL
  Filled 2020-05-14 (×4): qty 1

## 2020-05-14 MED ORDER — TRAMADOL HCL 50 MG PO TABS: 50.0000 mg | ORAL_TABLET | Freq: Three times a day (TID) | ORAL | Status: DC | PRN

## 2020-05-14 MED ORDER — SODIUM CHLORIDE 0.9% FLUSH: 3.0000 mL | Freq: Once | INTRAVENOUS | Status: DC

## 2020-05-14 NOTE — ED Notes (Signed)
ECHO being performed at this time ?

## 2020-05-14 NOTE — ED Notes (Signed)
Pt given dinner tray at this time.  

## 2020-05-14 NOTE — ED Provider Notes (Signed)
[facility] Emergency Department Provider Note  ____________________________________________   First MD Initiated Contact with Patient 05/14/20 1241     (approximate)  I have reviewed the triage vital signs and the nursing notes.  History  Chief Complaint Loss of Consciousness    HPI Lisa Bass is a 69 y.o. female past medical history as below, notable for cardiac myxoma s/p surgical intervention greater than 10 years ago, who presents to the emergency department for recurrent syncope.  Patient has syncopized 3 times in the last 2 weeks.  Most recently was today, when she was in the [facility] clinic.  She was being seen by cardiology for follow-up of an ER visit (5/22) from one of her prior episodes of syncope.  She was sitting down having a Holter monitor placed when she felt hot and flushed, and had a witnessed syncopal episode.  LOC for about 30 seconds before she regained consciousness. No trauma, was seating in a chair.  No shaking, seizure-like activity, or postictal period described.  She did have an episode of vomiting preceding the episode.  Patient states that the symptoms feels similar to when she was diagnosed with her myxoma.  No recent illnesses or infections.  No fevers, cough, chest pain.  No vomiting outside her prodrome.  No diarrhea. No known hx of arrhythmias.    Past Medical Hx Past Medical History:  Diagnosis Date  . Allergy   . Asthma   . Cataract   . CHF (congestive heart failure) ([place])   . Depression   . Hyperlipidemia   . Hypertension   . OCD (obsessive compulsive disorder)   . Palpitations   . Perimenopause     Problem List Patient Active Problem List   Diagnosis Date Noted  . Pneumonia, viral 05/08/2020  . Elevated TSH 02/23/2019  . Lipoma of neck 09/21/2017  . B12 deficiency 09/21/2017  . Prediabetes 09/21/2017  . Mild cognitive impairment 09/15/2017  . Positive colorectal cancer screening using Cologuard test 08/02/2017    . Tremor 07/27/2017  . Myxoma of heart 09/23/2015  . Benign neoplasm of heart 09/23/2015  . Major depression, chronic 08/09/2015  . OCD (obsessive compulsive disorder) 08/08/2015  . Hypertension 08/08/2015  . Hyperlipidemia 08/08/2015    Past Surgical Hx Past Surgical History:  Procedure Laterality Date  . CHOLECYSTECTOMY    . COLONOSCOPY WITH PROPOFOL N/A 09/07/2017   Procedure: COLONOSCOPY WITH PROPOFOL;  Surgeon: [name], MD;  Location: [facility] ENDOSCOPY;  Service: Endoscopy;  Laterality: N/A;  . open heart surgery    . ROOT CANAL     x2    Medications Prior to Admission medications   Medication Sig Start Date End Date Taking? Authorizing Provider  amLODipine (NORVASC) 5 MG tablet Take 1 tablet (5 mg total) by mouth daily. 04/25/19   [name] T, NP  aspirin 81 MG tablet Take 81 mg by mouth daily.    [provider]  cholecalciferol (VITAMIN D3) 25 MCG (1000 UT) tablet Take 1,000 Units by mouth daily.    [provider]  Cyanocobalamin (VITAMIN B 12 PO) Take by mouth daily.    [provider]  losartan-hydrochlorothiazide (HYZAAR) 100-25 MG tablet Take 1 tablet by mouth daily. 02/22/19   [name] T, NP  Multiple Vitamin (MULTIVITAMIN) tablet Take 1 tablet by mouth daily.    [provider]  PARoxetine (PAXIL) 40 MG tablet Take 0.5 tablets (20 mg total) by mouth daily. Take 1/2 dose 05/08/20   Cannady, [employer]  T, NP  rosuvastatin (CRESTOR) 10 MG tablet Take one tablet (10 MG) on Monday, Wednesday, and Friday at bedtime. 06/27/19   [name] T, NP    Allergies Penicillins, Biaxin [clarithromycin], and Lisinopril  Family Hx Family History  Problem Relation Age of Onset  . Hypertension Mother   . Dementia Father   . Hypertension Father   . Diabetes Father   . Depression Father   . GI problems Daughter   . Heart disease Maternal Grandfather   . Depression Sister     Social Hx Social History   Tobacco Use  . Smoking  status: Never Smoker  . Smokeless tobacco: Never Used  Substance Use Topics  . Alcohol use: No    Alcohol/week: 0.0 standard drinks  . Drug use: No     Review of Systems  Constitutional: Negative for fever. Negative for chills. Eyes: Negative for visual changes. ENT: Negative for sore throat. Cardiovascular: Negative for chest pain. Respiratory: Negative for shortness of breath. Gastrointestinal: Negative for nausea. Negative for vomiting.  Genitourinary: Negative for dysuria. Musculoskeletal: Negative for leg swelling. Skin: Negative for rash. Neurological: Negative for headaches. + LOC   Physical Exam  Vital Signs: ED Triage Vitals  Enc Vitals Group     BP 05/14/20 1019 129/68     Pulse Rate 05/14/20 1019 78     Resp 05/14/20 1247 19     Temp 05/14/20 1019 (!) 97.4 F (36.3 C)     Temp Source 05/14/20 1019 Oral     SpO2 05/14/20 1019 99 %     Weight 05/14/20 1017 181 lb 3.1 oz (82.2 kg)     Height 05/14/20 1017 5\' 1"  (1.549 m)     Head Circumference --      Peak Flow --      Pain Score 05/14/20 1017 0     Pain Loc --      Pain Edu? --      Excl. in [place]? --     Constitutional: Alert and oriented. Well appearing. NAD.  Head: Normocephalic. Atraumatic. Eyes: Conjunctivae clear. Sclera anicteric. Pupils equal and symmetric. Nose: No masses or lesions. No congestion or rhinorrhea. Mouth/Throat: Wearing mask.  Neck: No stridor. Trachea midline.  Cardiovascular: Normal rate, regular rhythm. Extremities well perfused. Respiratory: Normal respiratory effort.  Lungs CTAB. Gastrointestinal: Soft. Non-distended. Non-tender.  Genitourinary: Deferred. Musculoskeletal: No lower extremity edema. No deformities. Neurologic:  Normal speech and language. No gross focal or lateralizing neurologic deficits are appreciated.  Skin: Skin is warm, dry and intact. No rash noted. Psychiatric: Mood and affect are appropriate for situation.  EKG  Personally reviewed and interpreted  by myself.   Date: 05/14/20 Time: 1017 Rate: 76 Rhythm: sinus Axis: leftward Intervals: WNL No acute ischemic changes TWI V1, V2 No evidence of acute arrhythmia  No STEMI    Radiology  Personally reviewed available imaging myself.   CXR - IMPRESSION:  Lungs clear. Cardiac silhouette within normal limits.    Procedures  Procedure(s) performed (including critical care):  .1-3 Lead EKG Interpretation Performed by: Lilia Pro., MD Authorized by: Lilia Pro., MD     Interpretation: normal     ECG rate assessment: normal     Rhythm: sinus rhythm   Comments:     Indication: Syncope Impression: NSR     Initial Impression / Assessment and Plan / MDM / ED Course  69 y.o. female who presents to the ED for recurrent episodes of syncope, symptoms similar to when  she was diagnosed with her myxoma greater than 10 years ago.  Ddx: arrhythmia, dehydration/orthostatic syncope, recurrent myxoma, carotid stenosis. No chest pain, SOB, hypoxia, tachypnea, low suspicion for PE, but consider in the differential for syncope, will screen with dimer.   Will plan for labs, imaging, cardiac monitoring. Discussed with [place] who will perform echocardiogram in the ER to evaluate for recurrent myxoma.   EKG w/o evidence of acute arrhythmia.  Troponin and delta troponin negative.  Electrolytes without actionable derangements.  Creatinine 1.46, will give bolus of fluid.  Otherwise, given the persistence of her syncopal episodes, will plan for admission for tele monitoring and further syncope work up.  Patient and partner at bedside voiced understanding and are in agreement. Discussed w/ hospitalist for admission.    _______________________________   As part of my medical decision making I have reviewed available labs, radiology tests, reviewed old records/performed chart review, obtained additional history from family.     Final Clinical Impression(s) / ED Diagnosis  Final diagnoses:    Loss of consciousness [facility])       Note:  This document was prepared using Dragon voice recognition software and may include unintentional dictation errors.   Lilia Pro., MD 05/14/20 [phone]

## 2020-05-14 NOTE — ED Triage Notes (Addendum)
Pt presents to ED via wheelchair from [facility] with c/o syncopal episode, per Gladstone Pih, NP from [facility] pt with hx of Myxoma on the heart and has frequent syncopal episodes. Per NP from [facility] pt was having halter monitor placed when she began to feel hot and had a syncopal episode lasting approx 10 minutes. Pt seen 3 days ago for same. Pt with bandage in place from abrasion on forehead from syncopal episode 3 days ago.    Pt denies any pain at this time. Pt A&O x4. Pt noted to be pale and mildly diaphoretic upon arrival to ED. Pt states she feels much better.

## 2020-05-14 NOTE — Progress Notes (Signed)
*  PRELIMINARY RESULTS* Echocardiogram 2D Echocardiogram has been performed.  Lisa Bass 05/14/2020, 2:42 PM

## 2020-05-14 NOTE — H&P (Addendum)
History and Physical    Lisa Bass [id number] DOB: [birth year] DOA: 05/14/2020  PCP: Venita Lick, NP   Patient coming from: Dr. Etta Quill office    Chief Complaint: syncope  HPI: 69 y/o M w/ PMH of HTN, depression, obesity, myxoma s/p removal (2007) who presents w/ syncope x day of admission. This is the third episode of syncope. Pt was at Dr. Etta Quill office and loss consciousness for approx 30 secs as per pt's family member at bedside. Pt was sent to the ER as pt has hx of myxoma s/p surgical resection in 2007. Prior to the syncopal episode, pt c/o dizziness, nausea, vomiting, feeling hot & shortness of breath. Pt denies any hx of palpitations, a. fib/flutter. Pt currently denies any fevers, chills, sweating, cough, chest pain, abd pain, dysuria, urinary urgency, urinary frequency, diarrhea, or constipation.   Review of Systems: As per HPI otherwise 10 point review of systems negative.    Past Medical History:  Diagnosis Date  . Allergy   . Asthma   . Cataract   . CHF (congestive heart failure) ([place])   . Depression   . Hyperlipidemia   . Hypertension   . OCD (obsessive compulsive disorder)   . Palpitations   . Perimenopause     Past Surgical History:  Procedure Laterality Date  . CHOLECYSTECTOMY    . COLONOSCOPY WITH PROPOFOL N/A 09/07/2017   Procedure: COLONOSCOPY WITH PROPOFOL;  Surgeon: [name], MD;  Location: [facility] ENDOSCOPY;  Service: Endoscopy;  Laterality: N/A;  . open heart surgery    . ROOT CANAL     x2     reports that she has never smoked. She has never used smokeless tobacco. She reports that she does not drink alcohol or use drugs.  Allergies  Allergen Reactions  . Penicillins Anaphylaxis  . Biaxin [Clarithromycin] Other (See Comments)    GI issues  . Lisinopril Diarrhea    Family History  Problem Relation Age of Onset  . Hypertension Mother   . Dementia Father   . Hypertension Father   . Diabetes Father   . Depression Father    . GI problems Daughter   . Heart disease Maternal Grandfather   . Depression Sister      Prior to Admission medications   Medication Sig Start Date End Date Taking? Authorizing Provider  amLODipine (NORVASC) 5 MG tablet Take 1 tablet (5 mg total) by mouth daily. 04/25/19  Yes Cannady, Jolene T, NP  aspirin 81 MG tablet Take 81 mg by mouth daily.   Yes [provider]  cholecalciferol (VITAMIN D3) 25 MCG (1000 UT) tablet Take 1,000 Units by mouth daily.   Yes [provider]  losartan-hydrochlorothiazide (HYZAAR) 100-25 MG tablet Take 1 tablet by mouth daily. 02/22/19  Yes Cannady, Henrine Screws T, NP  Multiple Vitamin (MULTIVITAMIN) tablet Take 1 tablet by mouth daily.   Yes [provider]  PARoxetine (PAXIL) 40 MG tablet Take 0.5 tablets (20 mg total) by mouth daily. Take 1/2 dose 05/08/20  Yes Cannady, Jolene T, NP  vitamin B-12 (CYANOCOBALAMIN) 1000 MCG tablet Take 1,000 mcg by mouth daily.   Yes [provider]    Physical Exam: Vitals:   05/14/20 1017 05/14/20 1019 05/14/20 1247 05/14/20 1430  BP:  129/68 118/67 129/79  Pulse:  78 82   Resp:   19 10  Temp:  (!) 97.4 F (36.3 C)    TempSrc:  Oral    SpO2:  99% 100%   Weight: 82.2  kg     Height: 5\' 1"  (1.549 m)       Constitutional: NAD, calm, comfortable Vitals:   05/14/20 1017 05/14/20 1019 05/14/20 1247 05/14/20 1430  BP:  129/68 118/67 129/79  Pulse:  78 82   Resp:   19 10  Temp:  (!) 97.4 F (36.3 C)    TempSrc:  Oral    SpO2:  99% 100%   Weight: 82.2 kg     Height: 5\' 1"  (1.549 m)      Eyes: PERRL, lids and conjunctivae normal ENMT: Mucous membranes are moist.  Neck: normal, supple Respiratory: clear to auscultation bilaterally, no wheezing, no crackles. Normal respiratory effort.   Cardiovascular: normal S1/S2+. No rubs / gallops. No extremity edema. 2+ pedal pulses. Abdomen: soft, no tenderness, non-distended. Bowel sounds positive.  Musculoskeletal: no cyanosis.  Normal muscle  tone.  Skin: no rashes, lesions, ulcers.  Neurologic: CN 2-12 grossly intact. Moves all 4 extremities Psychiatric: Normal judgment and insight. Alert and oriented x 3. Normal mood.     Labs on Admission: I have personally reviewed following labs and imaging studies  CBC: Recent Labs  Lab 05/11/20 1500 05/14/20 1020  WBC 11.4* 11.5*  NEUTROABS 7.8*  --   HGB 13.7 14.0  HCT 40.7 42.6  MCV 82.7 85.0  PLT 304 [id number]   Basic Metabolic Panel: Recent Labs  Lab 05/11/20 1500 05/14/20 1020  NA 136 138  K 3.9 3.6  CL 98 103  CO2 27 25  GLUCOSE 119* 142*  BUN 14 16  CREATININE 1.79* 1.46*  CALCIUM 9.7 9.8   GFR: Estimated Creatinine Clearance: 35.4 mL/min (A) (by C-G formula based on SCr of 1.46 mg/dL (H)). Liver Function Tests: Recent Labs  Lab 05/11/20 1500 05/14/20 1020  AST 26 34  ALT 21 28  ALKPHOS 99 95  BILITOT 0.9 0.9  PROT 8.4* 8.4*  ALBUMIN 4.2 4.4   No results for input(s): LIPASE, AMYLASE in the last 168 hours. No results for input(s): AMMONIA in the last 168 hours. Coagulation Profile: No results for input(s): INR, PROTIME in the last 168 hours. Cardiac Enzymes: No results for input(s): CKTOTAL, CKMB, CKMBINDEX, TROPONINI in the last 168 hours. BNP (last 3 results) No results for input(s): PROBNP in the last 8760 hours. HbA1C: No results for input(s): HGBA1C in the last 72 hours. CBG: Recent Labs  Lab 05/14/20 1024  GLUCAP 135*   Lipid Profile: No results for input(s): CHOL, HDL, LDLCALC, TRIG, CHOLHDL, LDLDIRECT in the last 72 hours. Thyroid Function Tests: No results for input(s): TSH, T4TOTAL, FREET4, T3FREE, THYROIDAB in the last 72 hours. Anemia Panel: No results for input(s): VITAMINB12, FOLATE, FERRITIN, TIBC, IRON, RETICCTPCT in the last 72 hours. Urine analysis:    Component Value Date/Time   COLORURINE YELLOW (A) 04/28/2020 1417   APPEARANCEUR CLEAR (A) 04/28/2020 1417   APPEARANCEUR HAZY 04/01/2014 1633   LABSPEC >1.046 (H)  04/28/2020 1417   LABSPEC 1.025 04/01/2014 1633   PHURINE 6.0 04/28/2020 1417   GLUCOSEU NEGATIVE 04/28/2020 1417   GLUCOSEU NEGATIVE 04/01/2014 1633   HGBUR NEGATIVE 04/28/2020 1417   BILIRUBINUR NEGATIVE 04/28/2020 1417   BILIRUBINUR NEGATIVE 04/01/2014 1633   KETONESUR 5 (A) 04/28/2020 1417   PROTEINUR NEGATIVE 04/28/2020 1417   NITRITE NEGATIVE 04/28/2020 1417   LEUKOCYTESUR TRACE (A) 04/28/2020 1417   LEUKOCYTESUR NEGATIVE 04/01/2014 1633    Radiological Exams on Admission: DG Chest 2 View  Result Date: 05/14/2020 CLINICAL DATA:  Syncope EXAM: CHEST - 2 VIEW  COMPARISON:  Apr 28, 2020 FINDINGS: The lungs are clear. The heart size and pulmonary vascularity are normal. No adenopathy. Patient is status post median sternotomy. No pneumothorax. No bone lesions. IMPRESSION: Lungs clear.  Cardiac silhouette within normal limits. Electronically Signed   By: Lowella Grip III M.D.   On: 05/14/2020 11:08    EKG: Independently reviewed.   Assessment/Plan Active Problems:   * No active hospital problems. *  Syncope: etiology unclear, possibly cardiogenic vs neurocardiogenic. Orthostatic vitals ordered. CT brain ordered. Echo pending. Continue on tele. Cardio consulted (Dr. Clayborn Bigness). Pt has not taken any of BP meds today. Of note, pt stopped taking all of her BP at home secondary to making her "sick" but did not tell her PCP and then recently tried to restart them   Leukocytosis: likely reactive. Will continue to monitor   AKI: baseline Cr is unknown. Will start IVFs. Will continue to monitor   Hyperglycemia: no hx of DM. Will get HbA1c as pt is obese.   Depression: severity unknown. Will continue on home dose of paroxetine  HTN: will hold home dose of amlodipine, losartan-HCTZ as per is low normal  Obesity: BMI 34.2. Would benefit from weight loss  DVT prophylaxis: SCDs Code Status: full  Family Communication: discussed pt's care w/ pt's family who is bedside and answered their  questions  Disposition Plan:  Consults called: Dr. Clayborn Bigness (cardio) Admission status: obs   Wyvonnia Dusky MD [facility] Pager [phone]   If 7PM-7AM, please contact night-coverage [email or web address]   05/14/2020, 2:55 PM

## 2020-05-15 ENCOUNTER — Observation Stay: Payer: Medicare Other

## 2020-05-15 DIAGNOSIS — N179 Acute kidney failure, unspecified: Secondary | ICD-10-CM | POA: Diagnosis not present

## 2020-05-15 DIAGNOSIS — R55 Syncope and collapse: Secondary | ICD-10-CM | POA: Diagnosis not present

## 2020-05-15 LAB — ECHOCARDIOGRAM COMPLETE
Height: 61 in
Weight: 2899.14 oz

## 2020-05-15 LAB — BASIC METABOLIC PANEL
Anion gap: 6 (ref 5–15)
BUN: 15 mg/dL (ref 8–23)
CO2: 26 mmol/L (ref 22–32)
Calcium: 9 mg/dL (ref 8.9–10.3)
Chloride: 106 mmol/L (ref 98–111)
Creatinine, Ser: 0.97 mg/dL (ref 0.44–1.00)
GFR calc Af Amer: >60 mL/min (ref >60)
GFR calc non Af Amer: 60 mL/min — ABNORMAL LOW (ref >60)
Glucose, Bld: 113 mg/dL — ABNORMAL HIGH (ref 70–99)
Potassium: 3.9 mmol/L (ref 3.5–5.1)
Sodium: 138 mmol/L (ref 135–145)

## 2020-05-15 LAB — CBC
HCT: 36.3 % (ref 36.0–46.0)
Hemoglobin: 12.3 g/dL (ref 12.0–15.0)
MCH: 28.3 pg (ref 26.0–34.0)
MCHC: 33.9 g/dL (ref 30.0–36.0)
MCV: 83.6 fL (ref 80.0–100.0)
Platelets: 220 10*3/uL (ref 150–400)
RBC: 4.34 MIL/uL (ref 3.87–5.11)
RDW: 13.2 % (ref 11.5–15.5)
WBC: 9.4 10*3/uL (ref 4.0–10.5)
nRBC: 0 % (ref 0.0–0.2)

## 2020-05-15 LAB — HEMOGLOBIN A1C
Hgb A1c MFr Bld: 5.9 % — ABNORMAL HIGH (ref 4.8–5.6)
Mean Plasma Glucose: 122.63 mg/dL

## 2020-05-15 LAB — MAGNESIUM: Magnesium: 2.2 mg/dL (ref 1.7–2.4)

## 2020-05-15 MED ORDER — ASPIRIN 81 MG PO CHEW
81.0000 mg | CHEWABLE_TABLET | Freq: Every day | ORAL | Status: DC
  Administered 2020-05-16: 81 mg via ORAL
  Filled 2020-05-15: qty 1

## 2020-05-15 MED ORDER — GADOBUTROL 1 MMOL/ML IV SOLN
7.5000 mL | Freq: Once | INTRAVENOUS | Status: AC | PRN
  Administered 2020-05-15: 7.5 mL via INTRAVENOUS
  Filled 2020-05-15: qty 7.5

## 2020-05-15 MED ORDER — AMLODIPINE BESYLATE 5 MG PO TABS
5.0000 mg | ORAL_TABLET | Freq: Every day | ORAL | Status: DC
  Administered 2020-05-16 (×2): 5 mg via ORAL
  Filled 2020-05-15 (×2): qty 1

## 2020-05-15 NOTE — Progress Notes (Signed)
[facility] Cardiology    SUBJECTIVE: Patient states to feel much better she has been through neurology evaluation EEG and MRI none with any significant abnormalities noted she denies lightheaded dizziness weakness or syncope she states that she had not been as compliant with her blood pressure medications that she should and has been taking her losartan HCTZ sporadically   Vitals:   05/15/20 0401 05/15/20 0538 05/15/20 1145 05/15/20 1329  BP: (!) 162/74 (!) 169/69 (!) 148/68 (!) 150/71  Pulse: 75  73 67  Resp: 16  15 16   Temp:   98 F (36.7 C) 98.1 F (36.7 C)  TempSrc:   Oral Oral  SpO2: 96%  96% 98%  Weight:      Height:         Intake/Output Summary (Last 24 hours) at 05/15/2020 2010 Last data filed at 05/15/2020 1830 Gross per 24 hour  Intake 120 ml  Output --  Net 120 ml      PHYSICAL EXAM  General: Well developed, well nourished, in no acute distress HEENT:  Normocephalic and atramatic Neck:  No JVD.  Lungs: Clear bilaterally to auscultation and percussion. Heart: HRRR . Normal S1 and S2 without gallops or murmurs.  Abdomen: Bowel sounds are positive, abdomen soft and non-tender  Msk:  Back normal, normal gait. Normal strength and tone for age. Extremities: No clubbing, cyanosis or edema.   Neuro: Alert and oriented X 3. Psych:  Good affect, responds appropriately   LABS: Basic Metabolic Panel: Recent Labs    05/14/20 1020 05/15/20 0434  NA 138 138  K 3.6 3.9  CL 103 106  CO2 25 26  GLUCOSE 142* 113*  BUN 16 15  CREATININE 1.46* 0.97  CALCIUM 9.8 9.0  MG  --  2.2   Liver Function Tests: Recent Labs    05/14/20 1020  AST 34  ALT 28  ALKPHOS 95  BILITOT 0.9  PROT 8.4*  ALBUMIN 4.4   No results for input(s): LIPASE, AMYLASE in the last 72 hours. CBC: Recent Labs    05/14/20 1020 05/15/20 0434  WBC 11.5* 9.4  HGB 14.0 12.3  HCT 42.6 36.3  MCV 85.0 83.6  PLT 355 220   Cardiac Enzymes: No results for input(s): CKTOTAL, CKMB, CKMBINDEX,  TROPONINI in the last 72 hours. BNP: Invalid input(s): POCBNP D-Dimer: No results for input(s): DDIMER in the last 72 hours. Hemoglobin A1C: Recent Labs    05/15/20 0434  HGBA1C 5.9*   Fasting Lipid Panel: No results for input(s): CHOL, HDL, LDLCALC, TRIG, CHOLHDL, LDLDIRECT in the last 72 hours. Thyroid Function Tests: No results for input(s): TSH, T4TOTAL, T3FREE, THYROIDAB in the last 72 hours.  Invalid input(s): FREET3 Anemia Panel: No results for input(s): VITAMINB12, FOLATE, FERRITIN, TIBC, IRON, RETICCTPCT in the last 72 hours.  EEG  Result Date: 05/15/2020 Alexis Goodell, MD     05/15/2020  3:24 PM ELECTROENCEPHALOGRAM REPORT Patient: Lisa Bass       Room #: 118A-AA EEG No. ID: [id number] Age: 69 y.o.        Sex: female Requesting Physician: Billie Ruddy Report Date:  05/15/2020       Interpreting Physician: Alexis Goodell History: Lisa Bass is an 69 y.o. female with recurrent syncope Medications: Paxil Conditions of Recording:  This is a 21 channel routine scalp EEG performed with bipolar and monopolar montages arranged in accordance to the international 10/20 system of electrode placement. One channel was dedicated to EKG recording. The patient is in the awake,  drowsy and asleep states. Description:  The waking background activity consists of a low voltage, symmetrical, fairly well organized, 8 Hz alpha activity, seen from the parieto-occipital and posterior temporal regions.  Low voltage fast activity, poorly organized, is seen anteriorly and is at times superimposed on more posterior regions.  A mixture of theta and alpha rhythms are seen from the central and temporal regions. The patient drowses with slowing to irregular, low voltage theta and beta activity.  The patient goes in to a light sleep with symmetrical sleep spindles, vertex central sharp transients and irregular slow activity. No epileptiform activity is noted.  Hyperventilation was not performed.  Intermittent photic  stimulation was performed but failed to illicit any change in the tracing. IMPRESSION: Normal electroencephalogram, awake, asleep and with activation procedures. There are no focal lateralizing or epileptiform features. Alexis Goodell, MD Neurology [phone] 05/15/2020, 3:23 PM   DG Chest 2 View  Result Date: 05/14/2020 CLINICAL DATA:  Syncope EXAM: CHEST - 2 VIEW COMPARISON:  Apr 28, 2020 FINDINGS: The lungs are clear. The heart size and pulmonary vascularity are normal. No adenopathy. Patient is status post median sternotomy. No pneumothorax. No bone lesions. IMPRESSION: Lungs clear.  Cardiac silhouette within normal limits. Electronically Signed   By: Lowella Grip III M.D.   On: 05/14/2020 11:08   CT HEAD WO CONTRAST  Result Date: 05/14/2020 CLINICAL DATA:  Recurrent syncope EXAM: CT HEAD WITHOUT CONTRAST TECHNIQUE: Contiguous axial images were obtained from the base of the skull through the vertex without intravenous contrast. COMPARISON:  05/11/2020 FINDINGS: Brain: There is no acute intracranial hemorrhage, mass effect, or edema. Gray-white differentiation is preserved. There is no extra-axial fluid collection. Ventricles and sulci are stable in size and configuration. Vascular: No new finding. Skull: Calvarium is unremarkable. Sinuses/Orbits: No acute finding. Other: None. IMPRESSION: No acute intracranial abnormality. No significant change since 05/11/2020 Electronically Signed   By: Macy Mis M.D.   On: 05/14/2020 15:42   CT Angio Chest PE W/Cm &/Or Wo Cm  Result Date: 05/14/2020 CLINICAL DATA:  Multiple syncopal episodes. EXAM: CT ANGIOGRAPHY CHEST WITH CONTRAST TECHNIQUE: Multidetector CT imaging of the chest was performed using the standard protocol during bolus administration of intravenous contrast. Multiplanar CT image reconstructions and MIPs were obtained to evaluate the vascular anatomy. CONTRAST:  42mL OMNIPAQUE IOHEXOL 350 MG/ML SOLN COMPARISON:  December 02, 2007  FINDINGS: Cardiovascular: Satisfactory opacification of the pulmonary arteries to the segmental level. No evidence of pulmonary embolism. Normal heart size. No pericardial effusion. Mediastinum/Nodes: No enlarged mediastinal, hilar, or axillary lymph nodes. Thyroid gland, trachea, and esophagus demonstrate no significant findings. Lungs/Pleura: Very mild right apical atelectasis is seen. There is no evidence of a pleural effusion or pneumothorax. Upper Abdomen: Multiple surgical clips are seen within the gallbladder fossa. Musculoskeletal: Multiple sternal wires are present. No acute or significant osseous findings. Review of the MIP images confirms the above findings. IMPRESSION: 1. No evidence of pulmonary embolus or acute cardiopulmonary disease. 2. Very mild right apical atelectasis. 3. Evidence of prior median sternotomy and prior cholecystectomy. Electronically Signed   By: Virgina Norfolk M.D.   On: 05/14/2020 17:47   MR ANGIO HEAD WO CONTRAST  Result Date: 05/15/2020 CLINICAL DATA:  Recurrent syncope EXAM: MRI HEAD WITHOUT CONTRAST MRA HEAD WITHOUT CONTRAST MRA NECK WITHOUT AND WITH CONTRAST TECHNIQUE: Multiplanar, multiecho pulse sequences of the brain and surrounding structures were obtained without and with intravenous contrast. Angiographic images of the Circle of Willis were obtained using MRA technique without  intravenous contrast. Angiographic images of the neck were obtained using MRA technique without and with intravenous contrast. Carotid stenosis measurements (when applicable) are obtained utilizing NASCET criteria, using the distal internal carotid diameter as the denominator. CONTRAST:  MRI brain 2018 COMPARISON:  None. FINDINGS: MRI HEAD Brain: There is no acute infarction or intracranial hemorrhage. There is no intracranial mass, mass effect, or edema. There is no hydrocephalus or extra-axial fluid collection. Patchy foci of T2 hyperintensity in the supratentorial white matter are  nonspecific but may reflect mild chronic microvascular ischemic changes. Ventricles and sulci are slightly prominent compatible with minor generalized parenchymal volume loss. Vascular: Major vessel flow voids at the skull base are preserved. Skull and upper cervical spine: Normal marrow signal is preserved. Sinuses/Orbits: Paranasal sinuses are aerated. Orbits are unremarkable. Other: Sella is unremarkable.  Trace mastoid fluid opacification. MRA HEAD Intracranial internal carotid arteries are patent. Middle and anterior cerebral arteries are patent. Intracranial vertebral arteries, basilar artery, posterior cerebral arteries are patent. There is no significant stenosis or aneurysm. MRA NECK Common, internal, and external carotid arteries are patent. Extracranial vertebral arteries are patent. Left vertebral artery is slightly dominant. There is no hemodynamically significant stenosis or evidence of dissection. IMPRESSION: No evidence of recent infarction, hemorrhage, or mass. Mild chronic microvascular ischemic changes. No large vessel occlusion, hemodynamically significant stenosis, or evidence of dissection. Electronically Signed   By: Macy Mis M.D.   On: 05/15/2020 12:52   MR ANGIO NECK W WO CONTRAST  Result Date: 05/15/2020 CLINICAL DATA:  Recurrent syncope EXAM: MRI HEAD WITHOUT CONTRAST MRA HEAD WITHOUT CONTRAST MRA NECK WITHOUT AND WITH CONTRAST TECHNIQUE: Multiplanar, multiecho pulse sequences of the brain and surrounding structures were obtained without and with intravenous contrast. Angiographic images of the Circle of Willis were obtained using MRA technique without intravenous contrast. Angiographic images of the neck were obtained using MRA technique without and with intravenous contrast. Carotid stenosis measurements (when applicable) are obtained utilizing NASCET criteria, using the distal internal carotid diameter as the denominator. CONTRAST:  MRI brain 2018 COMPARISON:  None. FINDINGS:  MRI HEAD Brain: There is no acute infarction or intracranial hemorrhage. There is no intracranial mass, mass effect, or edema. There is no hydrocephalus or extra-axial fluid collection. Patchy foci of T2 hyperintensity in the supratentorial white matter are nonspecific but may reflect mild chronic microvascular ischemic changes. Ventricles and sulci are slightly prominent compatible with minor generalized parenchymal volume loss. Vascular: Major vessel flow voids at the skull base are preserved. Skull and upper cervical spine: Normal marrow signal is preserved. Sinuses/Orbits: Paranasal sinuses are aerated. Orbits are unremarkable. Other: Sella is unremarkable.  Trace mastoid fluid opacification. MRA HEAD Intracranial internal carotid arteries are patent. Middle and anterior cerebral arteries are patent. Intracranial vertebral arteries, basilar artery, posterior cerebral arteries are patent. There is no significant stenosis or aneurysm. MRA NECK Common, internal, and external carotid arteries are patent. Extracranial vertebral arteries are patent. Left vertebral artery is slightly dominant. There is no hemodynamically significant stenosis or evidence of dissection. IMPRESSION: No evidence of recent infarction, hemorrhage, or mass. Mild chronic microvascular ischemic changes. No large vessel occlusion, hemodynamically significant stenosis, or evidence of dissection. Electronically Signed   By: Macy Mis M.D.   On: 05/15/2020 12:52   MR BRAIN WO CONTRAST  Result Date: 05/15/2020 CLINICAL DATA:  Recurrent syncope EXAM: MRI HEAD WITHOUT CONTRAST MRA HEAD WITHOUT CONTRAST MRA NECK WITHOUT AND WITH CONTRAST TECHNIQUE: Multiplanar, multiecho pulse sequences of the brain and surrounding structures were obtained  without and with intravenous contrast. Angiographic images of the Circle of Willis were obtained using MRA technique without intravenous contrast. Angiographic images of the neck were obtained using MRA  technique without and with intravenous contrast. Carotid stenosis measurements (when applicable) are obtained utilizing NASCET criteria, using the distal internal carotid diameter as the denominator. CONTRAST:  MRI brain 2018 COMPARISON:  None. FINDINGS: MRI HEAD Brain: There is no acute infarction or intracranial hemorrhage. There is no intracranial mass, mass effect, or edema. There is no hydrocephalus or extra-axial fluid collection. Patchy foci of T2 hyperintensity in the supratentorial white matter are nonspecific but may reflect mild chronic microvascular ischemic changes. Ventricles and sulci are slightly prominent compatible with minor generalized parenchymal volume loss. Vascular: Major vessel flow voids at the skull base are preserved. Skull and upper cervical spine: Normal marrow signal is preserved. Sinuses/Orbits: Paranasal sinuses are aerated. Orbits are unremarkable. Other: Sella is unremarkable.  Trace mastoid fluid opacification. MRA HEAD Intracranial internal carotid arteries are patent. Middle and anterior cerebral arteries are patent. Intracranial vertebral arteries, basilar artery, posterior cerebral arteries are patent. There is no significant stenosis or aneurysm. MRA NECK Common, internal, and external carotid arteries are patent. Extracranial vertebral arteries are patent. Left vertebral artery is slightly dominant. There is no hemodynamically significant stenosis or evidence of dissection. IMPRESSION: No evidence of recent infarction, hemorrhage, or mass. Mild chronic microvascular ischemic changes. No large vessel occlusion, hemodynamically significant stenosis, or evidence of dissection. Electronically Signed   By: Macy Mis M.D.   On: 05/15/2020 12:52   ECHOCARDIOGRAM COMPLETE  Result Date: 05/15/2020    ECHOCARDIOGRAM REPORT   Patient Name:   Lisa Bass Date of Exam: 05/14/2020 Medical Rec #:  [id number]      Height:       61.0 in Accession #:    [id number]     Weight:        181.2 lb Date of Birth:  [birth year]      BSA:          1.811 m Patient Age:    69 years       BP:           118/67 mmHg Patient Gender: F              HR:           82 bpm. Exam Location:  [facility] Procedure: 2D Echo, Cardiac Doppler and Color Doppler STAT ECHO Indications:     Syncope 780.2  History:         Patient has no prior history of Echocardiogram examinations.                  CHF; Risk Factors:Hypertension.  Sonographer:     Sherrie Sport RDCS (AE) Referring Phys:  [place] L. MONKS Diagnosing Phys: Yolonda Kida MD  Sonographer Comments: Suboptimal apical window. IMPRESSIONS  1. Left ventricular ejection fraction, by estimation, is 60 to 65%. The left ventricle has normal function. The left ventricle has no regional wall motion abnormalities. Left ventricular diastolic parameters are consistent with Grade I diastolic dysfunction (impaired relaxation).  2. Right ventricular systolic function is normal. The right ventricular size is normal. There is normal pulmonary artery systolic pressure.  3. The mitral valve is grossly normal. Trivial mitral valve regurgitation.  4. The aortic valve is normal in structure. Aortic valve regurgitation is not visualized. FINDINGS  Left Ventricle: Left ventricular ejection fraction, by estimation, is 60 to 65%. The left  ventricle has normal function. The left ventricle has no regional wall motion abnormalities. The left ventricular internal cavity size was normal in size. There is  no left ventricular hypertrophy. Left ventricular diastolic parameters are consistent with Grade I diastolic dysfunction (impaired relaxation). Right Ventricle: The right ventricular size is normal. No increase in right ventricular wall thickness. Right ventricular systolic function is normal. There is normal pulmonary artery systolic pressure. The tricuspid regurgitant velocity is 1.90 m/s, and  with an assumed right atrial pressure of 10 mmHg, the estimated right ventricular systolic pressure  is [id number] mmHg. Left Atrium: Left atrial size was normal in size. Right Atrium: Right atrial size was normal in size. Pericardium: There is no evidence of pericardial effusion. Mitral Valve: The mitral valve is grossly normal. Trivial mitral valve regurgitation. Tricuspid Valve: The tricuspid valve is normal in structure. Tricuspid valve regurgitation is trivial. Aortic Valve: The aortic valve is normal in structure. Aortic valve regurgitation is not visualized. Aortic valve mean gradient measures 6.3 mmHg. Aortic valve peak gradient measures 11.5 mmHg. Aortic valve area, by VTI measures 1.96 cm. Pulmonic Valve: The pulmonic valve was normal in structure. Pulmonic valve regurgitation is not visualized. Aorta: The aortic root is normal in size and structure. IAS/Shunts: No atrial level shunt detected by color flow Doppler.  LEFT VENTRICLE PLAX 2D LVIDd:         3.70 cm  Diastology LVIDs:         2.14 cm  LV e' lateral:   5.22 cm/s LV PW:         1.34 cm  LV E/e' lateral: 15.0 LV IVS:        1.40 cm  LV e' medial:    5.77 cm/s LVOT diam:     2.00 cm  LV E/e' medial:  13.6 LV SV:         59 LV SV Index:   33 LVOT Area:     3.14 cm  RIGHT VENTRICLE RV Basal diam:  2.30 cm LEFT ATRIUM             Index       RIGHT ATRIUM           Index LA diam:        4.30 cm 2.37 cm/m  RA Area:     19.70 cm LA Vol (A2C):   43.2 ml 23.85 ml/m RA Volume:   58.20 ml  32.13 ml/m LA Vol (A4C):   68.2 ml 37.66 ml/m LA Biplane Vol: 55.5 ml 30.64 ml/m  AORTIC VALVE                    PULMONIC VALVE AV Area (Vmax):    1.89 cm     PV Vmax:        0.92 m/s AV Area (Vmean):   1.76 cm     PV Peak grad:   3.4 mmHg AV Area (VTI):     1.96 cm     RVOT Peak grad: 4 mmHg AV Vmax:           169.67 cm/s AV Vmean:          114.667 cm/s AV VTI:            0.303 m AV Peak Grad:      11.5 mmHg AV Mean Grad:      6.3 mmHg LVOT Vmax:         102.00 cm/s LVOT Vmean:  64.100 cm/s LVOT VTI:          0.189 m LVOT/AV VTI ratio: 0.62  AORTA Ao Root  diam: 2.50 cm MITRAL VALVE               TRICUSPID VALVE MV Area (PHT): 4.13 cm    TR Peak grad:   14.4 mmHg MV Decel Time: 184 msec    TR Vmax:        190.00 cm/s MV E velocity: 78.30 cm/s MV A velocity: 96.55 cm/s  SHUNTS MV E/A ratio:  0.81        Systemic VTI:  0.19 m                            Systemic Diam: 2.00 cm Devanshi Califf D Meghan Tiemann MD Electronically signed by Yolonda Kida MD Signature Date/Time: 05/15/2020/3:19:59 PM    Final      Echo normal echocardiogram no evidence of recurrent myxoma 60%  TELEMETRY: Normal sinus rhythm  ASSESSMENT AND PLAN:  Active Problems:   Syncope Obesity Hypertension Acute renal insufficiency Depression Noncompliance . Plan Agree with admit for further evaluation Appreciate neurology's input including EEG and MRI  recommend blood pressure management and control chronically Syncope unclear etiology recommend continue medical therapy including hydration Recommend weight loss exercise portion control for obesity the     Yolonda Kida, MD 05/15/2020 8:10 PM

## 2020-05-15 NOTE — Consult Note (Signed)
CARDIOLOGY CONSULT NOTE               Patient ID: CARLOYN SCHULKE MRN: [id number] DOB/AGE: [birth year] 69 y.o.  Admit date: 05/14/2020 Referring Physician Dr Enzo Bi hospitalist Primary Physician [name] nurse practitioner Primary Cardiologist [facility] Reason for Consultation syncope  HPI: Patient presents after an episode of syncope in the office.  Patient been referred to cardiology for further evaluation she had a few episodes of syncope has actually been seen in the emergency room and had a negative work-up while visiting cardiology today she had a syncopal episode in the office with nausea and vomiting she appeared to be in loss of consciousness for about 30 seconds denies any palpitations or tachycardia or chest pain.  No clear evidence that vital signs changes or heart rate changes.  Patient was transported to the emergency room for acute assessment of her symptoms and further thorough evaluation.  Syncopal episode recently started and she has had 3 or 4 episodes in a very short period of time.  She gives a history of myxoma excision in 2007 mild hypertension mild obesity now being admitted for further evaluation  Review of systems complete and found to be negative unless listed above     Past Medical History:  Diagnosis Date  . Allergy   . Asthma   . Cataract   . CHF (congestive heart failure) ([place])   . Depression   . Hyperlipidemia   . Hypertension   . OCD (obsessive compulsive disorder)   . Palpitations   . Perimenopause     Past Surgical History:  Procedure Laterality Date  . CHOLECYSTECTOMY    . COLONOSCOPY WITH PROPOFOL N/A 09/07/2017   Procedure: COLONOSCOPY WITH PROPOFOL;  Surgeon: [name], MD;  Location: [facility] ENDOSCOPY;  Service: Endoscopy;  Laterality: N/A;  . open heart surgery    . ROOT CANAL     x2    (Not in a hospital admission)  Social History   Socioeconomic History  . Marital status: Married    Spouse name: Not on file  . Number  of children: Not on file  . Years of education: Not on file  . Highest education level: High school graduate  Occupational History  . Occupation: retired   Tobacco Use  . Smoking status: Never Smoker  . Smokeless tobacco: Never Used  Substance and Sexual Activity  . Alcohol use: No    Alcohol/week: 0.0 standard drinks  . Drug use: No  . Sexual activity: Yes  Other Topics Concern  . Not on file  Social History Narrative   Very active in church    Social Determinants of Health   Financial Resource Strain:   . Difficulty of Paying Living Expenses:   Food Insecurity:   . Worried About Charity fundraiser in the Last Year:   . Arboriculturist in the Last Year:   Transportation Needs:   . Film/video editor (Medical):   Marland Kitchen Lack of Transportation (Non-Medical):   Physical Activity:   . Days of Exercise per Week:   . Minutes of Exercise per Session:   Stress:   . Feeling of Stress :   Social Connections:   . Frequency of Communication with Friends and Family:   . Frequency of Social Gatherings with Friends and Family:   . Attends Religious Services:   . Active Member of Clubs or Organizations:   . Attends Archivist Meetings:   Marland Kitchen Marital Status:  Intimate Partner Violence:   . Fear of Current or Ex-Partner:   . Emotionally Abused:   Marland Kitchen Physically Abused:   . Sexually Abused:     Family History  Problem Relation Age of Onset  . Hypertension Mother   . Dementia Father   . Hypertension Father   . Diabetes Father   . Depression Father   . GI problems Daughter   . Heart disease Maternal Grandfather   . Depression Sister       Review of systems complete and found to be negative unless listed above      PHYSICAL EXAM  General: Well developed, well nourished, in no acute distress HEENT:  Normocephalic and atramatic Neck:  No JVD.  Lungs: Clear bilaterally to auscultation and percussion. Heart: HRRR . Normal S1 and S2 without gallops or murmurs.    Abdomen: Bowel sounds are positive, abdomen soft and non-tender  Msk:  Back normal, normal gait. Normal strength and tone for age. Extremities: No clubbing, cyanosis or edema.   Neuro: Alert and oriented X 3. Psych:  Good affect, responds appropriately  Labs:   Lab Results  Component Value Date   WBC 9.4 05/15/2020   HGB 12.3 05/15/2020   HCT 36.3 05/15/2020   MCV 83.6 05/15/2020   PLT 220 05/15/2020    Recent Labs  Lab 05/14/20 1020 05/14/20 1020 05/15/20 0434  NA 138   < > 138  K 3.6   < > 3.9  CL 103   < > 106  CO2 25   < > 26  BUN 16   < > 15  CREATININE 1.46*   < > 0.97  CALCIUM 9.8   < > 9.0  PROT 8.4*  --   --   BILITOT 0.9  --   --   ALKPHOS 95  --   --   ALT 28  --   --   AST 34  --   --   GLUCOSE 142*   < > 113*   < > = values in this interval not displayed.   No results found for: CKTOTAL, CKMB, CKMBINDEX, TROPONINI  Lab Results  Component Value Date   CHOL 229 (H) 05/29/2019   CHOL 226 (H) 02/22/2019   CHOL 184 12/22/2016   Lab Results  Component Value Date   HDL 47 05/29/2019   HDL 50 02/22/2019   HDL 39 (L) 12/22/2016   Lab Results  Component Value Date   LDLCALC 155 (H) 05/29/2019   LDLCALC 144 (H) 02/22/2019   LDLCALC 93 12/22/2016   Lab Results  Component Value Date   TRIG 135 05/29/2019   TRIG 161 (H) 02/22/2019   TRIG 258 (H) 12/22/2016   No results found for: CHOLHDL No results found for: LDLDIRECT    Radiology: DG Chest 2 View  Result Date: 05/14/2020 CLINICAL DATA:  Syncope EXAM: CHEST - 2 VIEW COMPARISON:  Apr 28, 2020 FINDINGS: The lungs are clear. The heart size and pulmonary vascularity are normal. No adenopathy. Patient is status post median sternotomy. No pneumothorax. No bone lesions. IMPRESSION: Lungs clear.  Cardiac silhouette within normal limits. Electronically Signed   By: Lowella Grip III M.D.   On: 05/14/2020 11:08   CT HEAD WO CONTRAST  Result Date: 05/14/2020 CLINICAL DATA:  Recurrent syncope EXAM: CT  HEAD WITHOUT CONTRAST TECHNIQUE: Contiguous axial images were obtained from the base of the skull through the vertex without intravenous contrast. COMPARISON:  05/11/2020 FINDINGS: Brain: There is no acute  intracranial hemorrhage, mass effect, or edema. Gray-white differentiation is preserved. There is no extra-axial fluid collection. Ventricles and sulci are stable in size and configuration. Vascular: No new finding. Skull: Calvarium is unremarkable. Sinuses/Orbits: No acute finding. Other: None. IMPRESSION: No acute intracranial abnormality. No significant change since 05/11/2020 Electronically Signed   By: Macy Mis M.D.   On: 05/14/2020 15:42   CT Head Wo Contrast  Result Date: 05/11/2020 CLINICAL DATA:  Dizziness. Post syncopal episode. EXAM: CT HEAD WITHOUT CONTRAST TECHNIQUE: Contiguous axial images were obtained from the base of the skull through the vertex without intravenous contrast. COMPARISON:  CT of the head April 05, 2009, MRI of the head September 23, 2017 FINDINGS: Brain: No evidence of acute infarction, hemorrhage, hydrocephalus, extra-axial collection or mass lesion/mass effect. Vascular: No hyperdense vessel or unexpected calcification. Skull: Normal. Negative for fracture or focal lesion. Sinuses/Orbits: No acute finding. Other: None. IMPRESSION: No acute intracranial abnormality. Electronically Signed   By: Fidela Salisbury M.D.   On: 05/11/2020 16:46   CT Angio Chest PE W/Cm &/Or Wo Cm  Result Date: 05/14/2020 CLINICAL DATA:  Multiple syncopal episodes. EXAM: CT ANGIOGRAPHY CHEST WITH CONTRAST TECHNIQUE: Multidetector CT imaging of the chest was performed using the standard protocol during bolus administration of intravenous contrast. Multiplanar CT image reconstructions and MIPs were obtained to evaluate the vascular anatomy. CONTRAST:  27mL OMNIPAQUE IOHEXOL 350 MG/ML SOLN COMPARISON:  December 02, 2007 FINDINGS: Cardiovascular: Satisfactory opacification of the pulmonary  arteries to the segmental level. No evidence of pulmonary embolism. Normal heart size. No pericardial effusion. Mediastinum/Nodes: No enlarged mediastinal, hilar, or axillary lymph nodes. Thyroid gland, trachea, and esophagus demonstrate no significant findings. Lungs/Pleura: Very mild right apical atelectasis is seen. There is no evidence of a pleural effusion or pneumothorax. Upper Abdomen: Multiple surgical clips are seen within the gallbladder fossa. Musculoskeletal: Multiple sternal wires are present. No acute or significant osseous findings. Review of the MIP images confirms the above findings. IMPRESSION: 1. No evidence of pulmonary embolus or acute cardiopulmonary disease. 2. Very mild right apical atelectasis. 3. Evidence of prior median sternotomy and prior cholecystectomy. Electronically Signed   By: Virgina Norfolk M.D.   On: 05/14/2020 17:47   CT ABDOMEN PELVIS W CONTRAST  Result Date: 04/28/2020 CLINICAL DATA:  Nausea and vomiting. EXAM: CT ABDOMEN AND PELVIS WITH CONTRAST TECHNIQUE: Multidetector CT imaging of the abdomen and pelvis was performed using the standard protocol following bolus administration of intravenous contrast. CONTRAST:  171mL OMNIPAQUE IOHEXOL 300 MG/ML  SOLN COMPARISON:  April 01, 2014 FINDINGS: Lower chest: No acute abnormality. Hepatobiliary: There is mild diffuse fatty infiltration of the liver parenchyma. No focal liver abnormality is seen. Status post cholecystectomy. No biliary dilatation. Pancreas: Unremarkable. No pancreatic ductal dilatation or surrounding inflammatory changes. Spleen: Normal in size without focal abnormality. Adrenals/Urinary Tract: Adrenal glands are unremarkable. Kidneys are normal, without renal calculi, focal lesion, or hydronephrosis. Bladder is unremarkable. Stomach/Bowel: Stomach is within normal limits. Appendix appears normal. No evidence of bowel wall thickening, distention, or inflammatory changes. Vascular/Lymphatic: No significant  vascular findings are present. No enlarged abdominal or pelvic lymph nodes. Reproductive: Uterus and bilateral adnexa are unremarkable. Other: No abdominal wall hernia or abnormality. No abdominopelvic ascites. Musculoskeletal: No acute or significant osseous findings. IMPRESSION: 1. Fatty liver. 2. Status post cholecystectomy. Electronically Signed   By: Virgina Norfolk M.D.   On: 04/28/2020 16:05   DG Chest Portable 1 View  Result Date: 04/28/2020 CLINICAL DATA:  Cough. Additional history provided: New  onset nausea today, near syncope, productive cough for 2 days, history of asthma and CHF. EXAM: PORTABLE CHEST 1 VIEW COMPARISON:  Chest CT 12/03/2007, chest radiograph 12/02/2007 FINDINGS: Prior median sternotomy. Unchanged cardiomegaly. There is central pulmonary vascular congestion. There are subtle interstitial opacities at the right lung base. The left lung is clear. No evidence of pleural effusion or pneumothorax. No acute bony abnormality identified. IMPRESSION: Cardiomegaly with central pulmonary vascular congestion. Subtle interstitial opacities at the right lung base which may reflect atelectasis or interstitial edema. Atypical/viral pneumonia is difficult to exclude. Electronically Signed   By: Kellie Simmering DO   On: 04/28/2020 14:20    EKG: Normal sinus rhythm nonspecific ST-T wave changes rate of about 80  ASSESSMENT AND PLAN:  Syncope Obesity Nausea History of myxoma 2007 Depression Hypertension Acute renal insufficiency . Plan Agree with admit to telemetry Follow-up EKG troponin Recommend antinausea medications like Zofran Consider neurology evaluation for syncope Follow-up telemetry for evidence of an arrhythmia causing her symptoms At this point is unlikely her syncope is cardiac related Echocardiogram preliminary shows no recurrence of her myxoma normal LV function basically normal echocardiogram Consider outpatient functional study Consider ENT  evaluation   Signed: Yolonda Kida MD, 05/15/2020, 6:46 AM

## 2020-05-15 NOTE — Progress Notes (Signed)
[facility] Cardiology  Patient Description: Mrs. Landesman is a 69 year old female with PMH significant for CAD, h/o myxoma s/p excision (2007), HTN and obesity who was admitted for syncope episode with positive LOC.   SUBJECTIVE: The patient reports to be feeling better on today and denies any complaints at this time. The patient denies having any more syncopal episodes, dizziness or emesis at this time. She is in the ED stretcher, sitting comfortably with her husband at the bedside.   OBJECTIVE: The patient appears well, AOx4, NSR on the monitor and is in no apparent distress. Echocardiogram final results are pending; however, preliminary results are negative for any return of the myxoma. Telemetry and EKG are both unremarkable for any signs of arrhythmia or ischemic changes. Troponins negative and all other blood laboratory work is unremarkable.   Vitals:   05/15/20 0315 05/15/20 0330 05/15/20 0401 05/15/20 0538  BP:   (!) 162/74 (!) 169/69  Pulse: 70 66 75   Resp: 14 15 16    Temp:      TempSrc:      SpO2: 96% 95% 96%   Weight:      Height:         Intake/Output Summary (Last 24 hours) at 05/15/2020 0817 Last data filed at 05/14/2020 1453 Gross per 24 hour  Intake 1000 ml  Output --  Net 1000 ml      PHYSICAL EXAM  General: Well developed, well nourished, in no acute distress HEENT:  Normocephalic and atramatic Neck:  No JVD. Supple. Carotid +2 Lungs: Clear bilaterally to auscultation. Chest expansion symmetrical  Heart: HRRR . Normal S1 and S2 without gallops or murmurs.  Abdomen: Bowel sounds are positive, abdomen soft and non-tender  Msk:  Back normal, normal gait. Normal strength and tone for age. Extremities: No clubbing, cyanosis or edema.   Neuro: Alert and oriented X 4. Cranial nerves II-XII grossly intact.  Psych:  Good affect, responds appropriately   LABS: Basic Metabolic Panel: Recent Labs    05/14/20 1020 05/15/20 0434  NA 138 138  K 3.6 3.9  CL 103 106  CO2 25  26  GLUCOSE 142* 113*  BUN 16 15  CREATININE 1.46* 0.97  CALCIUM 9.8 9.0  MG  --  2.2   Liver Function Tests: Recent Labs    05/14/20 1020  AST 34  ALT 28  ALKPHOS 95  BILITOT 0.9  PROT 8.4*  ALBUMIN 4.4   No results for input(s): LIPASE, AMYLASE in the last 72 hours. CBC: Recent Labs    05/14/20 1020 05/15/20 0434  WBC 11.5* 9.4  HGB 14.0 12.3  HCT 42.6 36.3  MCV 85.0 83.6  PLT 355 220   Cardiac Enzymes: No results for input(s): CKTOTAL, CKMB, CKMBINDEX, TROPONINI in the last 72 hours. BNP: Invalid input(s): POCBNP D-Dimer: No results for input(s): DDIMER in the last 72 hours. Hemoglobin A1C: No results for input(s): HGBA1C in the last 72 hours. Fasting Lipid Panel: No results for input(s): CHOL, HDL, LDLCALC, TRIG, CHOLHDL, LDLDIRECT in the last 72 hours. Thyroid Function Tests: No results for input(s): TSH, T4TOTAL, T3FREE, THYROIDAB in the last 72 hours.  Invalid input(s): FREET3 Anemia Panel: No results for input(s): VITAMINB12, FOLATE, FERRITIN, TIBC, IRON, RETICCTPCT in the last 72 hours.  DG Chest 2 View  Result Date: 05/14/2020 CLINICAL DATA:  Syncope EXAM: CHEST - 2 VIEW COMPARISON:  Apr 28, 2020 FINDINGS: The lungs are clear. The heart size and pulmonary vascularity are normal. No adenopathy. Patient is status post  median sternotomy. No pneumothorax. No bone lesions. IMPRESSION: Lungs clear.  Cardiac silhouette within normal limits. Electronically Signed   By: Lowella Grip III M.D.   On: 05/14/2020 11:08   CT HEAD WO CONTRAST  Result Date: 05/14/2020 CLINICAL DATA:  Recurrent syncope EXAM: CT HEAD WITHOUT CONTRAST TECHNIQUE: Contiguous axial images were obtained from the base of the skull through the vertex without intravenous contrast. COMPARISON:  05/11/2020 FINDINGS: Brain: There is no acute intracranial hemorrhage, mass effect, or edema. Gray-Letita Prentiss differentiation is preserved. There is no extra-axial fluid collection. Ventricles and sulci are  stable in size and configuration. Vascular: No new finding. Skull: Calvarium is unremarkable. Sinuses/Orbits: No acute finding. Other: None. IMPRESSION: No acute intracranial abnormality. No significant change since 05/11/2020 Electronically Signed   By: Macy Mis M.D.   On: 05/14/2020 15:42   CT Angio Chest PE W/Cm &/Or Wo Cm  Result Date: 05/14/2020 CLINICAL DATA:  Multiple syncopal episodes. EXAM: CT ANGIOGRAPHY CHEST WITH CONTRAST TECHNIQUE: Multidetector CT imaging of the chest was performed using the standard protocol during bolus administration of intravenous contrast. Multiplanar CT image reconstructions and MIPs were obtained to evaluate the vascular anatomy. CONTRAST:  68mL OMNIPAQUE IOHEXOL 350 MG/ML SOLN COMPARISON:  December 02, 2007 FINDINGS: Cardiovascular: Satisfactory opacification of the pulmonary arteries to the segmental level. No evidence of pulmonary embolism. Normal heart size. No pericardial effusion. Mediastinum/Nodes: No enlarged mediastinal, hilar, or axillary lymph nodes. Thyroid gland, trachea, and esophagus demonstrate no significant findings. Lungs/Pleura: Very mild right apical atelectasis is seen. There is no evidence of a pleural effusion or pneumothorax. Upper Abdomen: Multiple surgical clips are seen within the gallbladder fossa. Musculoskeletal: Multiple sternal wires are present. No acute or significant osseous findings. Review of the MIP images confirms the above findings. IMPRESSION: 1. No evidence of pulmonary embolus or acute cardiopulmonary disease. 2. Very mild right apical atelectasis. 3. Evidence of prior median sternotomy and prior cholecystectomy. Electronically Signed   By: Virgina Norfolk M.D.   On: 05/14/2020 17:47     Echo: pending   TELEMETRY: NSR   ASSESSMENT AND PLAN:  Active Problems:   Syncope  HTN HLD    PLAN:   1. Syncope, recurrent, stable at this time  -Agree with admit to telemetry with continuous cardiac monitoring to  evaluate for possible arrhythmias.   -Agree with gentle IV hydration.   -Echocardiogram final results are pending; however, preliminary results are negative for any return of the myxoma.   - We will consider outpatient cardiac functional study.   -Recommend Neurology consult for further evaluation.    HTN, stable, patient is normotensive at this time   -Recommend continuing home medications: Amlodipine 5mg  once daily and losartan/hctz 50-12.5mg  once daily.  -Heart health diet   HLD, reasonably controlled  -Recommend continuing crestor 5mg .    Anayla Giannetti, ACNPC-AG  05/15/2020 8:17 AM

## 2020-05-15 NOTE — Procedures (Signed)
ELECTROENCEPHALOGRAM REPORT   Patient: Lisa Bass       Room #: 118A-AA EEG No. ID: [id number] Age: 69 y.o.        Sex: female Requesting Physician: Billie Ruddy Report Date:  05/15/2020        Interpreting Physician: Alexis Goodell  History: [name] is an 69 y.o. female with recurrent syncope  Medications:  Paxil  Conditions of Recording:  This is a 21 channel routine scalp EEG performed with bipolar and monopolar montages arranged in accordance to the international 10/20 system of electrode placement. One channel was dedicated to EKG recording.  The patient is in the awake, drowsy and asleep states.  Description:  The waking background activity consists of a low voltage, symmetrical, fairly well organized, 8 Hz alpha activity, seen from the parieto-occipital and posterior temporal regions.  Low voltage fast activity, poorly organized, is seen anteriorly and is at times superimposed on more posterior regions.  A mixture of theta and alpha rhythms are seen from the central and temporal regions. The patient drowses with slowing to irregular, low voltage theta and beta activity.   The patient goes in to a light sleep with symmetrical sleep spindles, vertex central sharp transients and irregular slow activity.  No epileptiform activity is noted.   Hyperventilation was not performed.  Intermittent photic stimulation was performed but failed to illicit any change in the tracing.   IMPRESSION: Normal electroencephalogram, awake, asleep and with activation procedures. There are no focal lateralizing or epileptiform features.   Alexis Goodell, MD Neurology [phone] 05/15/2020, 3:23 PM

## 2020-05-15 NOTE — Consult Note (Addendum)
Reason for Consult:Recurrent syncope Requesting Physician: Billie Ruddy  CC: Recurrent syncope  I have been asked by Dr. Billie Ruddy to see this patient in consultation for recurrent syncope.  HPI: Lisa Bass is an 69 y.o. female with a history of CHF, HLD, HTN, depression and OCD who presents with recurrent syncopal episodes.  Patient reports that her first was 2 weeks ago and she has had 3 syncopal events during this time.  Husband helps with history.  It appears that the patient has a hot sensation that starts at her feet and moves up her body.  She then loses consciousness.  Husband reports she is out for about 30 seconds.  Has no tonic-clonic movements.  When she comes to it takes a few minutes to come back to herself.  After ward she also has vomiting.  These episodes have always occurred when she is either sitting or standing.  Cardiac work up has been unremarkable.    Past Medical History:  Diagnosis Date  . Allergy   . Asthma   . Cataract   . CHF (congestive heart failure) ([place])   . Depression   . Hyperlipidemia   . Hypertension   . OCD (obsessive compulsive disorder)   . Palpitations   . Perimenopause     Past Surgical History:  Procedure Laterality Date  . CHOLECYSTECTOMY    . COLONOSCOPY WITH PROPOFOL N/A 09/07/2017   Procedure: COLONOSCOPY WITH PROPOFOL;  Surgeon: [name], MD;  Location: [facility] ENDOSCOPY;  Service: Endoscopy;  Laterality: N/A;  . open heart surgery    . ROOT CANAL     x2    Family History  Problem Relation Age of Onset  . Hypertension Mother   . Dementia Father   . Hypertension Father   . Diabetes Father   . Depression Father   . GI problems Daughter   . Heart disease Maternal Grandfather   . Depression Sister     Social History:  reports that she has never smoked. She has never used smokeless tobacco. She reports that she does not drink alcohol or use drugs.  Allergies  Allergen Reactions  . Penicillins Anaphylaxis  . Biaxin [Clarithromycin] Other  (See Comments)    GI issues  . Lisinopril Diarrhea    Medications: I have reviewed the patient's current medications. Prior to Admission medications   Medication Sig Start Date End Date Taking? Authorizing Provider  amLODipine (NORVASC) 5 MG tablet Take 1 tablet (5 mg total) by mouth daily. 04/25/19  Yes Cannady, Jolene T, NP  aspirin 81 MG tablet Take 81 mg by mouth daily.   Yes [provider]  cholecalciferol (VITAMIN D3) 25 MCG (1000 UT) tablet Take 1,000 Units by mouth daily.   Yes [provider]  losartan-hydrochlorothiazide (HYZAAR) 100-25 MG tablet Take 1 tablet by mouth daily. 02/22/19  Yes Cannady, Henrine Screws T, NP  Multiple Vitamin (MULTIVITAMIN) tablet Take 1 tablet by mouth daily.   Yes [provider]  PARoxetine (PAXIL) 40 MG tablet Take 0.5 tablets (20 mg total) by mouth daily. Take 1/2 dose 05/08/20  Yes Cannady, Jolene T, NP  vitamin B-12 (CYANOCOBALAMIN) 1000 MCG tablet Take 1,000 mcg by mouth daily.   Yes [provider]    ROS: History obtained from the patient  General ROS: negative for - chills, fatigue, fever, night sweats, weight gain or weight loss Psychological ROS: negative for - behavioral disorder, hallucinations, memory difficulties, mood swings or suicidal ideation Ophthalmic ROS: negative for - blurry vision, double vision,  eye pain or loss of vision ENT ROS: negative for - epistaxis, nasal discharge, oral lesions, sore throat, tinnitus or vertigo Allergy and Immunology ROS: negative for - hives or itchy/watery eyes Hematological and Lymphatic ROS: negative for - bleeding problems, bruising or swollen lymph nodes Endocrine ROS: negative for - galactorrhea, hair pattern changes, polydipsia/polyuria or temperature intolerance Respiratory ROS: negative for - cough, hemoptysis, shortness of breath or wheezing Cardiovascular ROS: negative for - chest pain, dyspnea on exertion, edema or irregular heartbeat Gastrointestinal ROS: as  noted in HPI Genito-Urinary ROS: negative for - dysuria, hematuria, incontinence or urinary frequency/urgency Musculoskeletal ROS: negative for - joint swelling or muscular weakness Neurological ROS: as noted in HPI Dermatological ROS: negative for rash and skin lesion changes  Physical Examination: Blood pressure (!) 169/69, pulse 75, temperature (!) 97.4 F (36.3 C), temperature source Oral, resp. rate 16, height 5\' 1"  (1.549 m), weight 82.2 kg, SpO2 96 %.  HEENT-  Left periorbital hematoma Cardiovascular- S1, S2 normal, pulses palpable throughout   Lungs- chest clear, no wheezing, rales, normal symmetric air entry Abdomen- soft, non-tender; bowel sounds normal; no masses,  no organomegaly Extremities- mild BLE edema Lymph-no adenopathy palpable Musculoskeletal-no joint tenderness, deformity or swelling Skin-warm and dry, no hyperpigmentation, vitiligo, or suspicious lesions  Neurological Examination   Mental Status: Alert, oriented, thought content appropriate.  Speech fluent without evidence of aphasia.  Able to follow 3 step commands without difficulty. Cranial Nerves: II: Discs flat bilaterally; Visual fields grossly normal, pupils equal, round, reactive to light and accommodation III,IV, VI: ptosis not present, extra-ocular motions intact bilaterally V,VII: smile symmetric, facial light touch sensation normal bilaterally VIII: hearing normal bilaterally IX,X: gag reflex present XI: bilateral shoulder shrug XII: midline tongue extension Motor: Right : Upper extremity   5/5    Left:     Upper extremity   5/5  Lower extremity   5/5     Lower extremity   5/5 Tone and bulk:normal tone throughout; no atrophy noted Sensory: Pinprick and light touch intact throughout, bilaterally Deep Tendon Reflexes: Symmetric throughout Plantars: Right: downgoing   Left: downgoing Cerebellar: Normal finger-to-nose and normal heel-to-shin testing bilaterally Gait: not tested due to safety  concerns    Laboratory Studies:   Basic Metabolic Panel: Recent Labs  Lab 05/11/20 1500 05/14/20 1020 05/15/20 0434  NA 136 138 138  K 3.9 3.6 3.9  CL 98 103 106  CO2 27 25 26   GLUCOSE 119* 142* 113*  BUN 14 16 15   CREATININE 1.79* 1.46* 0.97  CALCIUM 9.7 9.8 9.0  MG  --   --  2.2    Liver Function Tests: Recent Labs  Lab 05/11/20 1500 05/14/20 1020  AST 26 34  ALT 21 28  ALKPHOS 99 95  BILITOT 0.9 0.9  PROT 8.4* 8.4*  ALBUMIN 4.2 4.4   No results for input(s): LIPASE, AMYLASE in the last 168 hours. No results for input(s): AMMONIA in the last 168 hours.  CBC: Recent Labs  Lab 05/11/20 1500 05/14/20 1020 05/15/20 0434  WBC 11.4* 11.5* 9.4  NEUTROABS 7.8*  --   --   HGB 13.7 14.0 12.3  HCT 40.7 42.6 36.3  MCV 82.7 85.0 83.6  PLT 304 355 220    Cardiac Enzymes: No results for input(s): CKTOTAL, CKMB, CKMBINDEX, TROPONINI in the last 168 hours.  BNP: Invalid input(s): POCBNP  CBG: Recent Labs  Lab 05/14/20 1024  GLUCAP 135*    Microbiology: Results for orders placed or performed during the  hospital encounter of 05/14/20  SARS Coronavirus 2 by RT PCR (hospital order, performed in [facility] hospital lab) Nasopharyngeal Nasopharyngeal Swab     Status: None   Collection Time: 05/14/20  2:54 PM   Specimen: Nasopharyngeal Swab  Result Value Ref Range Status   SARS Coronavirus 2 NEGATIVE NEGATIVE Final    Comment: (NOTE) SARS-CoV-2 target nucleic acids are NOT DETECTED. The SARS-CoV-2 RNA is generally detectable in upper and lower respiratory specimens during the acute phase of infection. The lowest concentration of SARS-CoV-2 viral copies this assay can detect is 250 copies / mL. A negative result does not preclude SARS-CoV-2 infection and should not be used as the sole basis for treatment or other patient management decisions.  A negative result may occur with improper specimen collection / handling, submission of specimen other than  nasopharyngeal swab, presence of viral mutation(s) within the areas targeted by this assay, and inadequate number of viral copies (<250 copies / mL). A negative result must be combined with clinical observations, patient history, and epidemiological information. Fact Sheet for Patients:   StrictlyIdeas.no Fact Sheet for Healthcare Providers: BankingDealers.co.za This test is not yet approved or cleared  by the [place] FDA and has been authorized for detection and/or diagnosis of SARS-CoV-2 by FDA under an Emergency Use Authorization (EUA).  This EUA will remain in effect (meaning this test can be used) for the duration of the COVID-19 declaration under Section 564(b)(1) of the Act, 21 U.S.C. section 360bbb-3(b)(1), unless the authorization is terminated or revoked sooner. Performed at [facility], [address] [place], [place] [address]     Coagulation Studies: No results for input(s): LABPROT, INR in the last 72 hours.  Urinalysis: No results for input(s): COLORURINE, LABSPEC, PHURINE, GLUCOSEU, HGBUR, BILIRUBINUR, KETONESUR, PROTEINUR, UROBILINOGEN, NITRITE, LEUKOCYTESUR in the last 168 hours.  Invalid input(s): APPERANCEUR  Lipid Panel:     Component Value Date/Time   CHOL 229 (H) 05/29/2019 0958   TRIG 135 05/29/2019 0958   HDL 47 05/29/2019 0958   LDLCALC 155 (H) 05/29/2019 0958    HgbA1C:  Lab Results  Component Value Date   HGBA1C 5.9 (H) 05/15/2020    Urine Drug Screen:  No results found for: LABOPIA, COCAINSCRNUR, LABBENZ, AMPHETMU, THCU, LABBARB  Alcohol Level: No results for input(s): ETH in the last 168 hours.  Other results: EKG: normal sinus rhythm at 76 bpm.  Imaging: DG Chest 2 View  Result Date: 05/14/2020 CLINICAL DATA:  Syncope EXAM: CHEST - 2 VIEW COMPARISON:  Apr 28, 2020 FINDINGS: The lungs are clear. The heart size and pulmonary vascularity are normal. No adenopathy. Patient is  status post median sternotomy. No pneumothorax. No bone lesions. IMPRESSION: Lungs clear.  Cardiac silhouette within normal limits. Electronically Signed   By: Lowella Grip III M.D.   On: 05/14/2020 11:08   CT HEAD WO CONTRAST  Result Date: 05/14/2020 CLINICAL DATA:  Recurrent syncope EXAM: CT HEAD WITHOUT CONTRAST TECHNIQUE: Contiguous axial images were obtained from the base of the skull through the vertex without intravenous contrast. COMPARISON:  05/11/2020 FINDINGS: Brain: There is no acute intracranial hemorrhage, mass effect, or edema. Gray-white differentiation is preserved. There is no extra-axial fluid collection. Ventricles and sulci are stable in size and configuration. Vascular: No new finding. Skull: Calvarium is unremarkable. Sinuses/Orbits: No acute finding. Other: None. IMPRESSION: No acute intracranial abnormality. No significant change since 05/11/2020 Electronically Signed   By: Macy Mis M.D.   On: 05/14/2020 15:42   CT Angio  Chest PE W/Cm &/Or Wo Cm  Result Date: 05/14/2020 CLINICAL DATA:  Multiple syncopal episodes. EXAM: CT ANGIOGRAPHY CHEST WITH CONTRAST TECHNIQUE: Multidetector CT imaging of the chest was performed using the standard protocol during bolus administration of intravenous contrast. Multiplanar CT image reconstructions and MIPs were obtained to evaluate the vascular anatomy. CONTRAST:  6mL OMNIPAQUE IOHEXOL 350 MG/ML SOLN COMPARISON:  December 02, 2007 FINDINGS: Cardiovascular: Satisfactory opacification of the pulmonary arteries to the segmental level. No evidence of pulmonary embolism. Normal heart size. No pericardial effusion. Mediastinum/Nodes: No enlarged mediastinal, hilar, or axillary lymph nodes. Thyroid gland, trachea, and esophagus demonstrate no significant findings. Lungs/Pleura: Very mild right apical atelectasis is seen. There is no evidence of a pleural effusion or pneumothorax. Upper Abdomen: Multiple surgical clips are seen within the  gallbladder fossa. Musculoskeletal: Multiple sternal wires are present. No acute or significant osseous findings. Review of the MIP images confirms the above findings. IMPRESSION: 1. No evidence of pulmonary embolus or acute cardiopulmonary disease. 2. Very mild right apical atelectasis. 3. Evidence of prior median sternotomy and prior cholecystectomy. Electronically Signed   By: Virgina Norfolk M.D.   On: 05/14/2020 17:47     Assessment/Plan: 69 y.o. female with a history of CHF, HLD, HTN, depression and OCD who presents with recurrent syncopal episodes.  Events preceded by a sensation of heat rising up her body and followed by vomiting.  Cardiac work up to date is unremarkable.  Head CT personally reviewed and shows no acute changes. Differential includes orthostasis, seizure and vertebrobasilar insufficiency.  Further work up recommended.    Recommendations: 1. Orthostatic vitals 2. MRI of the brain, MRA of the head and neck 3. EEG 4. Patient unable to drive   Alexis Goodell, MD Neurology [phone] 05/15/2020, 10:58 AM

## 2020-05-15 NOTE — ED Notes (Signed)
Pt taken to MRI  

## 2020-05-15 NOTE — Progress Notes (Signed)
PROGRESS NOTE    Lisa Bass  [id number] DOB: October 17, 1951 DOA: 05/14/2020 PCP: Venita Lick, NP    Assessment & Plan:   Active Problems:   Syncope    Lisa Bass is a 69 y/o female w/ PMH of HTN, depression, obesity, myxoma s/p removal (2007) who presented w/ recurrent episodes of syncope.  Syncope --syncope events proceded by feeling flushed, nausea and vomiting.  Cardiology consulted and did not believe syncope is of cardiogenic cause, and recommended neuro consult. --Of note, pt took her BP intermittently.   PLAN: --Neuro consult today, recommended MRI brain, MRI of the brain, MRA of the head and neck, EEG (done today, all wnl) --Orthostatic BP measurement (done today, neg) --resume home BP meds 1 at a time (amlodipine for now) --continue tele  Leukocytosis likely reactive. Will continue to monitor   AKI, POA, resolved Cr 1.46 on presentation.  Baseline Cr ~1.  Cr improved to 0.97 today after MIVF.  Pre-diabetes A1c 5.9.  No need for fingersticks.    Depression --continue on home dose of paroxetine  HTN --resume amlodipine  --Hold home losartan-HCTZ for now  Obesity: BMI 34.2.  Would benefit from weight loss   DVT prophylaxis: SCD/Compression stockings Code Status: Full code  Family Communication: Husband and daughter updated at bedside today Status is: observation Dispo:   The patient is from: home Anticipated d/c is to: home Anticipated d/c date is: tomorrow Patient currently is not medically stable to d/c due to: Need to complete workup for syncope as recommended by cardiology and neurology   Subjective and Interval History:  No more syncope event since presentation.  No fever, dyspnea, chest pain, abdominal pain, N/V/D, dysuria.  Pt reported adequate hydration.   Objective: Vitals:   05/15/20 0401 05/15/20 0538 05/15/20 1145 05/15/20 1329  BP: (!) 162/74 (!) 169/69 (!) 148/68 (!) 150/71  Pulse: 75  73 67  Resp: 16  15 16     Temp:   98 F (36.7 C) 98.1 F (36.7 C)  TempSrc:   Oral Oral  SpO2: 96%  96% 98%  Weight:      Height:        Intake/Output Summary (Last 24 hours) at 05/15/2020 2207 Last data filed at 05/15/2020 1830 Gross per 24 hour  Intake 120 ml  Output --  Net 120 ml   Filed Weights   05/14/20 1017  Weight: 82.2 kg    Examination:   Constitutional: NAD, AAOx3 HEENT: conjunctivae and lids normal, EOMI, yellow bruise along the eye brow bone CV: RRR no M,R,G. Distal pulses +2.  No cyanosis.   RESP: CTA B/L, normal respiratory effort  GI: +BS, NTND Extremities: No effusions, edema, or tenderness in BLE SKIN: warm, dry and intact Neuro: II - XII grossly intact.  Sensation intact Psych: Normal mood and affect.  Appropriate judgement and reason   Data Reviewed: I have personally reviewed following labs and imaging studies  CBC: Recent Labs  Lab 05/11/20 1500 05/14/20 1020 05/15/20 0434  WBC 11.4* 11.5* 9.4  NEUTROABS 7.8*  --   --   HGB 13.7 14.0 12.3  HCT 40.7 42.6 36.3  MCV 82.7 85.0 83.6  PLT 304 355 [id number]   Basic Metabolic Panel: Recent Labs  Lab 05/11/20 1500 05/14/20 1020 05/15/20 0434  NA 136 138 138  K 3.9 3.6 3.9  CL 98 103 106  CO2 27 25 26   GLUCOSE 119* 142* 113*  BUN 14 16 15   CREATININE 1.79* 1.46* 0.97  CALCIUM 9.7 9.8 9.0  MG  --   --  2.2   GFR: Estimated Creatinine Clearance: 53.2 mL/min (by C-G formula based on SCr of 0.97 mg/dL). Liver Function Tests: Recent Labs  Lab 05/11/20 1500 05/14/20 1020  AST 26 34  ALT 21 28  ALKPHOS 99 95  BILITOT 0.9 0.9  PROT 8.4* 8.4*  ALBUMIN 4.2 4.4   No results for input(s): LIPASE, AMYLASE in the last 168 hours. No results for input(s): AMMONIA in the last 168 hours. Coagulation Profile: No results for input(s): INR, PROTIME in the last 168 hours. Cardiac Enzymes: No results for input(s): CKTOTAL, CKMB, CKMBINDEX, TROPONINI in the last 168 hours. BNP (last 3 results) No results for input(s):  PROBNP in the last 8760 hours. HbA1C: Recent Labs    05/15/20 0434  HGBA1C 5.9*   CBG: Recent Labs  Lab 05/14/20 1024  GLUCAP 135*   Lipid Profile: No results for input(s): CHOL, HDL, LDLCALC, TRIG, CHOLHDL, LDLDIRECT in the last 72 hours. Thyroid Function Tests: No results for input(s): TSH, T4TOTAL, FREET4, T3FREE, THYROIDAB in the last 72 hours. Anemia Panel: No results for input(s): VITAMINB12, FOLATE, FERRITIN, TIBC, IRON, RETICCTPCT in the last 72 hours. Sepsis Labs: No results for input(s): PROCALCITON, LATICACIDVEN in the last 168 hours.  Recent Results (from the past 240 hour(s))  SARS Coronavirus 2 by RT PCR (hospital order, performed in [facility] hospital lab) Nasopharyngeal Nasopharyngeal Swab     Status: None   Collection Time: 05/14/20  2:54 PM   Specimen: Nasopharyngeal Swab  Result Value Ref Range Status   SARS Coronavirus 2 NEGATIVE NEGATIVE Final    Comment: (NOTE) SARS-CoV-2 target nucleic acids are NOT DETECTED. The SARS-CoV-2 RNA is generally detectable in upper and lower respiratory specimens during the acute phase of infection. The lowest concentration of SARS-CoV-2 viral copies this assay can detect is 250 copies / mL. A negative result does not preclude SARS-CoV-2 infection and should not be used as the sole basis for treatment or other patient management decisions.  A negative result may occur with improper specimen collection / handling, submission of specimen other than nasopharyngeal swab, presence of viral mutation(s) within the areas targeted by this assay, and inadequate number of viral copies (<250 copies / mL). A negative result must be combined with clinical observations, patient history, and epidemiological information. Fact Sheet for Patients:   StrictlyIdeas.no Fact Sheet for Healthcare Providers: BankingDealers.co.za This test is not yet approved or cleared  by the [place] FDA  and has been authorized for detection and/or diagnosis of SARS-CoV-2 by FDA under an Emergency Use Authorization (EUA).  This EUA will remain in effect (meaning this test can be used) for the duration of the COVID-19 declaration under Section 564(b)(1) of the Act, 21 U.S.C. section 360bbb-3(b)(1), unless the authorization is terminated or revoked sooner. Performed at [facility], [address] [place], [place] [address]       Radiology Studies: EEG  Result Date: 05/15/2020 Alexis Goodell, MD     05/15/2020  3:24 PM ELECTROENCEPHALOGRAM REPORT Patient: Lisa Bass       Room #: 118A-AA EEG No. ID: [id number] Age: 69 y.o.        Sex: female Requesting Physician: Billie Ruddy Report Date:  05/15/2020       Interpreting Physician: Alexis Goodell History: Lisa Bass is an 68 y.o. female with recurrent syncope Medications: Paxil Conditions of Recording:  This is a 21 channel routine scalp EEG performed with bipolar  and monopolar montages arranged in accordance to the international 10/20 system of electrode placement. One channel was dedicated to EKG recording. The patient is in the awake, drowsy and asleep states. Description:  The waking background activity consists of a low voltage, symmetrical, fairly well organized, 8 Hz alpha activity, seen from the parieto-occipital and posterior temporal regions.  Low voltage fast activity, poorly organized, is seen anteriorly and is at times superimposed on more posterior regions.  A mixture of theta and alpha rhythms are seen from the central and temporal regions. The patient drowses with slowing to irregular, low voltage theta and beta activity.  The patient goes in to a light sleep with symmetrical sleep spindles, vertex central sharp transients and irregular slow activity. No epileptiform activity is noted.  Hyperventilation was not performed.  Intermittent photic stimulation was performed but failed to illicit any change in the tracing. IMPRESSION:  Normal electroencephalogram, awake, asleep and with activation procedures. There are no focal lateralizing or epileptiform features. Alexis Goodell, MD Neurology [phone] 05/15/2020, 3:23 PM   DG Chest 2 View  Result Date: 05/14/2020 CLINICAL DATA:  Syncope EXAM: CHEST - 2 VIEW COMPARISON:  Apr 28, 2020 FINDINGS: The lungs are clear. The heart size and pulmonary vascularity are normal. No adenopathy. Patient is status post median sternotomy. No pneumothorax. No bone lesions. IMPRESSION: Lungs clear.  Cardiac silhouette within normal limits. Electronically Signed   By: Lowella Grip III M.D.   On: 05/14/2020 11:08   CT HEAD WO CONTRAST  Result Date: 05/14/2020 CLINICAL DATA:  Recurrent syncope EXAM: CT HEAD WITHOUT CONTRAST TECHNIQUE: Contiguous axial images were obtained from the base of the skull through the vertex without intravenous contrast. COMPARISON:  05/11/2020 FINDINGS: Brain: There is no acute intracranial hemorrhage, mass effect, or edema. Gray-white differentiation is preserved. There is no extra-axial fluid collection. Ventricles and sulci are stable in size and configuration. Vascular: No new finding. Skull: Calvarium is unremarkable. Sinuses/Orbits: No acute finding. Other: None. IMPRESSION: No acute intracranial abnormality. No significant change since 05/11/2020 Electronically Signed   By: Macy Mis M.D.   On: 05/14/2020 15:42   CT Angio Chest PE W/Cm &/Or Wo Cm  Result Date: 05/14/2020 CLINICAL DATA:  Multiple syncopal episodes. EXAM: CT ANGIOGRAPHY CHEST WITH CONTRAST TECHNIQUE: Multidetector CT imaging of the chest was performed using the standard protocol during bolus administration of intravenous contrast. Multiplanar CT image reconstructions and MIPs were obtained to evaluate the vascular anatomy. CONTRAST:  2mL OMNIPAQUE IOHEXOL 350 MG/ML SOLN COMPARISON:  December 02, 2007 FINDINGS: Cardiovascular: Satisfactory opacification of the pulmonary arteries to the segmental  level. No evidence of pulmonary embolism. Normal heart size. No pericardial effusion. Mediastinum/Nodes: No enlarged mediastinal, hilar, or axillary lymph nodes. Thyroid gland, trachea, and esophagus demonstrate no significant findings. Lungs/Pleura: Very mild right apical atelectasis is seen. There is no evidence of a pleural effusion or pneumothorax. Upper Abdomen: Multiple surgical clips are seen within the gallbladder fossa. Musculoskeletal: Multiple sternal wires are present. No acute or significant osseous findings. Review of the MIP images confirms the above findings. IMPRESSION: 1. No evidence of pulmonary embolus or acute cardiopulmonary disease. 2. Very mild right apical atelectasis. 3. Evidence of prior median sternotomy and prior cholecystectomy. Electronically Signed   By: Virgina Norfolk M.D.   On: 05/14/2020 17:47   MR ANGIO HEAD WO CONTRAST  Result Date: 05/15/2020 CLINICAL DATA:  Recurrent syncope EXAM: MRI HEAD WITHOUT CONTRAST MRA HEAD WITHOUT CONTRAST MRA NECK WITHOUT AND WITH CONTRAST TECHNIQUE: Multiplanar, multiecho pulse  sequences of the brain and surrounding structures were obtained without and with intravenous contrast. Angiographic images of the Circle of Willis were obtained using MRA technique without intravenous contrast. Angiographic images of the neck were obtained using MRA technique without and with intravenous contrast. Carotid stenosis measurements (when applicable) are obtained utilizing NASCET criteria, using the distal internal carotid diameter as the denominator. CONTRAST:  MRI brain 2018 COMPARISON:  None. FINDINGS: MRI HEAD Brain: There is no acute infarction or intracranial hemorrhage. There is no intracranial mass, mass effect, or edema. There is no hydrocephalus or extra-axial fluid collection. Patchy foci of T2 hyperintensity in the supratentorial white matter are nonspecific but may reflect mild chronic microvascular ischemic changes. Ventricles and sulci are  slightly prominent compatible with minor generalized parenchymal volume loss. Vascular: Major vessel flow voids at the skull base are preserved. Skull and upper cervical spine: Normal marrow signal is preserved. Sinuses/Orbits: Paranasal sinuses are aerated. Orbits are unremarkable. Other: Sella is unremarkable.  Trace mastoid fluid opacification. MRA HEAD Intracranial internal carotid arteries are patent. Middle and anterior cerebral arteries are patent. Intracranial vertebral arteries, basilar artery, posterior cerebral arteries are patent. There is no significant stenosis or aneurysm. MRA NECK Common, internal, and external carotid arteries are patent. Extracranial vertebral arteries are patent. Left vertebral artery is slightly dominant. There is no hemodynamically significant stenosis or evidence of dissection. IMPRESSION: No evidence of recent infarction, hemorrhage, or mass. Mild chronic microvascular ischemic changes. No large vessel occlusion, hemodynamically significant stenosis, or evidence of dissection. Electronically Signed   By: Macy Mis M.D.   On: 05/15/2020 12:52   MR ANGIO NECK W WO CONTRAST  Result Date: 05/15/2020 CLINICAL DATA:  Recurrent syncope EXAM: MRI HEAD WITHOUT CONTRAST MRA HEAD WITHOUT CONTRAST MRA NECK WITHOUT AND WITH CONTRAST TECHNIQUE: Multiplanar, multiecho pulse sequences of the brain and surrounding structures were obtained without and with intravenous contrast. Angiographic images of the Circle of Willis were obtained using MRA technique without intravenous contrast. Angiographic images of the neck were obtained using MRA technique without and with intravenous contrast. Carotid stenosis measurements (when applicable) are obtained utilizing NASCET criteria, using the distal internal carotid diameter as the denominator. CONTRAST:  MRI brain 2018 COMPARISON:  None. FINDINGS: MRI HEAD Brain: There is no acute infarction or intracranial hemorrhage. There is no intracranial  mass, mass effect, or edema. There is no hydrocephalus or extra-axial fluid collection. Patchy foci of T2 hyperintensity in the supratentorial white matter are nonspecific but may reflect mild chronic microvascular ischemic changes. Ventricles and sulci are slightly prominent compatible with minor generalized parenchymal volume loss. Vascular: Major vessel flow voids at the skull base are preserved. Skull and upper cervical spine: Normal marrow signal is preserved. Sinuses/Orbits: Paranasal sinuses are aerated. Orbits are unremarkable. Other: Sella is unremarkable.  Trace mastoid fluid opacification. MRA HEAD Intracranial internal carotid arteries are patent. Middle and anterior cerebral arteries are patent. Intracranial vertebral arteries, basilar artery, posterior cerebral arteries are patent. There is no significant stenosis or aneurysm. MRA NECK Common, internal, and external carotid arteries are patent. Extracranial vertebral arteries are patent. Left vertebral artery is slightly dominant. There is no hemodynamically significant stenosis or evidence of dissection. IMPRESSION: No evidence of recent infarction, hemorrhage, or mass. Mild chronic microvascular ischemic changes. No large vessel occlusion, hemodynamically significant stenosis, or evidence of dissection. Electronically Signed   By: Macy Mis M.D.   On: 05/15/2020 12:52   MR BRAIN WO CONTRAST  Result Date: 05/15/2020 CLINICAL DATA:  Recurrent syncope EXAM:  MRI HEAD WITHOUT CONTRAST MRA HEAD WITHOUT CONTRAST MRA NECK WITHOUT AND WITH CONTRAST TECHNIQUE: Multiplanar, multiecho pulse sequences of the brain and surrounding structures were obtained without and with intravenous contrast. Angiographic images of the Circle of Willis were obtained using MRA technique without intravenous contrast. Angiographic images of the neck were obtained using MRA technique without and with intravenous contrast. Carotid stenosis measurements (when applicable) are  obtained utilizing NASCET criteria, using the distal internal carotid diameter as the denominator. CONTRAST:  MRI brain 2018 COMPARISON:  None. FINDINGS: MRI HEAD Brain: There is no acute infarction or intracranial hemorrhage. There is no intracranial mass, mass effect, or edema. There is no hydrocephalus or extra-axial fluid collection. Patchy foci of T2 hyperintensity in the supratentorial white matter are nonspecific but may reflect mild chronic microvascular ischemic changes. Ventricles and sulci are slightly prominent compatible with minor generalized parenchymal volume loss. Vascular: Major vessel flow voids at the skull base are preserved. Skull and upper cervical spine: Normal marrow signal is preserved. Sinuses/Orbits: Paranasal sinuses are aerated. Orbits are unremarkable. Other: Sella is unremarkable.  Trace mastoid fluid opacification. MRA HEAD Intracranial internal carotid arteries are patent. Middle and anterior cerebral arteries are patent. Intracranial vertebral arteries, basilar artery, posterior cerebral arteries are patent. There is no significant stenosis or aneurysm. MRA NECK Common, internal, and external carotid arteries are patent. Extracranial vertebral arteries are patent. Left vertebral artery is slightly dominant. There is no hemodynamically significant stenosis or evidence of dissection. IMPRESSION: No evidence of recent infarction, hemorrhage, or mass. Mild chronic microvascular ischemic changes. No large vessel occlusion, hemodynamically significant stenosis, or evidence of dissection. Electronically Signed   By: Macy Mis M.D.   On: 05/15/2020 12:52   ECHOCARDIOGRAM COMPLETE  Result Date: 05/15/2020    ECHOCARDIOGRAM REPORT   Patient Name:   Lisa Bass Date of Exam: 05/14/2020 Medical Rec #:  [id number]      Height:       61.0 in Accession #:    [id number]     Weight:       181.2 lb Date of Birth:  January 05, 1951      BSA:          1.811 m Patient Age:    25 years       BP:            118/67 mmHg Patient Gender: F              HR:           82 bpm. Exam Location:  [facility] Procedure: 2D Echo, Cardiac Doppler and Color Doppler STAT ECHO Indications:     Syncope 780.2  History:         Patient has no prior history of Echocardiogram examinations.                  CHF; Risk Factors:Hypertension.  Sonographer:     Sherrie Sport RDCS (AE) Referring Phys:  [place] L. MONKS Diagnosing Phys: Yolonda Kida MD  Sonographer Comments: Suboptimal apical window. IMPRESSIONS  1. Left ventricular ejection fraction, by estimation, is 60 to 65%. The left ventricle has normal function. The left ventricle has no regional wall motion abnormalities. Left ventricular diastolic parameters are consistent with Grade I diastolic dysfunction (impaired relaxation).  2. Right ventricular systolic function is normal. The right ventricular size is normal. There is normal pulmonary artery systolic pressure.  3. The mitral valve is grossly normal. Trivial mitral valve regurgitation.  4. The aortic  valve is normal in structure. Aortic valve regurgitation is not visualized. FINDINGS  Left Ventricle: Left ventricular ejection fraction, by estimation, is 60 to 65%. The left ventricle has normal function. The left ventricle has no regional wall motion abnormalities. The left ventricular internal cavity size was normal in size. There is  no left ventricular hypertrophy. Left ventricular diastolic parameters are consistent with Grade I diastolic dysfunction (impaired relaxation). Right Ventricle: The right ventricular size is normal. No increase in right ventricular wall thickness. Right ventricular systolic function is normal. There is normal pulmonary artery systolic pressure. The tricuspid regurgitant velocity is 1.90 m/s, and  with an assumed right atrial pressure of 10 mmHg, the estimated right ventricular systolic pressure is [id number] mmHg. Left Atrium: Left atrial size was normal in size. Right Atrium: Right atrial size was  normal in size. Pericardium: There is no evidence of pericardial effusion. Mitral Valve: The mitral valve is grossly normal. Trivial mitral valve regurgitation. Tricuspid Valve: The tricuspid valve is normal in structure. Tricuspid valve regurgitation is trivial. Aortic Valve: The aortic valve is normal in structure. Aortic valve regurgitation is not visualized. Aortic valve mean gradient measures 6.3 mmHg. Aortic valve peak gradient measures 11.5 mmHg. Aortic valve area, by VTI measures 1.96 cm. Pulmonic Valve: The pulmonic valve was normal in structure. Pulmonic valve regurgitation is not visualized. Aorta: The aortic root is normal in size and structure. IAS/Shunts: No atrial level shunt detected by color flow Doppler.  LEFT VENTRICLE PLAX 2D LVIDd:         3.70 cm  Diastology LVIDs:         2.14 cm  LV e' lateral:   5.22 cm/s LV PW:         1.34 cm  LV E/e' lateral: 15.0 LV IVS:        1.40 cm  LV e' medial:    5.77 cm/s LVOT diam:     2.00 cm  LV E/e' medial:  13.6 LV SV:         59 LV SV Index:   33 LVOT Area:     3.14 cm  RIGHT VENTRICLE RV Basal diam:  2.30 cm LEFT ATRIUM             Index       RIGHT ATRIUM           Index LA diam:        4.30 cm 2.37 cm/m  RA Area:     19.70 cm LA Vol (A2C):   43.2 ml 23.85 ml/m RA Volume:   58.20 ml  32.13 ml/m LA Vol (A4C):   68.2 ml 37.66 ml/m LA Biplane Vol: 55.5 ml 30.64 ml/m  AORTIC VALVE                    PULMONIC VALVE AV Area (Vmax):    1.89 cm     PV Vmax:        0.92 m/s AV Area (Vmean):   1.76 cm     PV Peak grad:   3.4 mmHg AV Area (VTI):     1.96 cm     RVOT Peak grad: 4 mmHg AV Vmax:           169.67 cm/s AV Vmean:          114.667 cm/s AV VTI:            0.303 m AV Peak Grad:      11.5 mmHg AV Mean Grad:  6.3 mmHg LVOT Vmax:         102.00 cm/s LVOT Vmean:        64.100 cm/s LVOT VTI:          0.189 m LVOT/AV VTI ratio: 0.62  AORTA Ao Root diam: 2.50 cm MITRAL VALVE               TRICUSPID VALVE MV Area (PHT): 4.13 cm    TR Peak grad:    14.4 mmHg MV Decel Time: 184 msec    TR Vmax:        190.00 cm/s MV E velocity: 78.30 cm/s MV A velocity: 96.55 cm/s  SHUNTS MV E/A ratio:  0.81        Systemic VTI:  0.19 m                            Systemic Diam: 2.00 cm Yolonda Kida MD Electronically signed by Yolonda Kida MD Signature Date/Time: 05/15/2020/3:19:59 PM    Final      Scheduled Meds: . PARoxetine  20 mg Oral Daily  . sodium chloride flush  3 mL Intravenous Once  . sodium chloride flush  3 mL Intravenous Q12H   Continuous Infusions:   LOS: 0 days     Enzo Bi, MD [facility] If 7PM-7AM, please contact night-coverage 05/15/2020, 10:07 PM

## 2020-05-15 NOTE — Progress Notes (Signed)
eeg completed ° °

## 2020-05-16 DIAGNOSIS — R55 Syncope and collapse: Secondary | ICD-10-CM | POA: Diagnosis not present

## 2020-05-16 DIAGNOSIS — N179 Acute kidney failure, unspecified: Secondary | ICD-10-CM | POA: Diagnosis not present

## 2020-05-16 LAB — BASIC METABOLIC PANEL
Anion gap: 7 (ref 5–15)
BUN: 11 mg/dL (ref 8–23)
CO2: 27 mmol/L (ref 22–32)
Calcium: 9 mg/dL (ref 8.9–10.3)
Chloride: 106 mmol/L (ref 98–111)
Creatinine, Ser: 0.79 mg/dL (ref 0.44–1.00)
GFR calc Af Amer: >60 mL/min (ref >60)
GFR calc non Af Amer: >60 mL/min (ref >60)
Glucose, Bld: 109 mg/dL — ABNORMAL HIGH (ref 70–99)
Potassium: 3.7 mmol/L (ref 3.5–5.1)
Sodium: 140 mmol/L (ref 135–145)

## 2020-05-16 LAB — CBC
HCT: 36.9 % (ref 36.0–46.0)
Hemoglobin: 11.8 g/dL — ABNORMAL LOW (ref 12.0–15.0)
MCH: 27.5 pg (ref 26.0–34.0)
MCHC: 32 g/dL (ref 30.0–36.0)
MCV: 86 fL (ref 80.0–100.0)
Platelets: 224 10*3/uL (ref 150–400)
RBC: 4.29 MIL/uL (ref 3.87–5.11)
RDW: 13.1 % (ref 11.5–15.5)
WBC: 7.1 10*3/uL (ref 4.0–10.5)
nRBC: 0 % (ref 0.0–0.2)

## 2020-05-16 LAB — MAGNESIUM: Magnesium: 2.1 mg/dL (ref 1.7–2.4)

## 2020-05-16 MED ORDER — LOSARTAN POTASSIUM 50 MG PO TABS
100.0000 mg | ORAL_TABLET | Freq: Every day | ORAL | Status: DC
  Administered 2020-05-16: 09:00:00 100 mg via ORAL
  Filled 2020-05-16: qty 2

## 2020-05-16 MED ORDER — LOSARTAN POTASSIUM-HCTZ 100-25 MG PO TABS: 1.0000 | ORAL_TABLET | Freq: Every day | ORAL | Status: DC

## 2020-05-16 MED ORDER — HYDROCHLOROTHIAZIDE 25 MG PO TABS
25.0000 mg | ORAL_TABLET | Freq: Every day | ORAL | Status: DC
  Administered 2020-05-16: 25 mg via ORAL
  Filled 2020-05-16: qty 1

## 2020-05-16 NOTE — Progress Notes (Signed)
Discharge instructions given and went over with patient and husband at bedside. All questions answered. Patient to be discharged home. Will transport patient to Dr. Etta Quill office per MD for monitor placement.   Madlyn Frankel, RN

## 2020-05-16 NOTE — Discharge Summary (Signed)
Physician Discharge Summary   RONDA LUBARSKY  female DOB: 1951/09/29  [id number]  PCP: Venita Lick, NP  Admit date: 05/14/2020 Discharge date: 05/16/2020  Admitted From: home Disposition:  Home.  Family updated about discharge plan prior to discharge.  CODE STATUS: Full code  Discharge Instructions    Diet - low sodium heart healthy   Complete by: As directed    Discharge instructions   Complete by: As directed    As we have discussed with you and your family, all your workups with cardiology and neurology have been neg so far.  Your blood pressure has also been high without your home blood pressure medications, so we are having you continue taking all of your home blood pressure medications.  Dr. Clayborn Bigness asks that you stop by his office today after you are discharged to get the cardiac event monitor placed.  Since you have symptoms before your fainting spells, be sure to sit down if you experience them again to avoid falling and getting hurt.   Dr. Enzo Bi - -   Increase activity slowly   Complete by: As directed        Hospital Course:  For full details, please see H&P, progress notes, consult notes and ancillary notes.  Briefly,  LATARSHIA CHRISTMAS is a 69 y/o Caucasian female w/ PMH of HTN, depression, obesity,myxoma s/p removal (2007) who presented w/ recurrent episodes of syncope.  Syncope Syncope events proceded by feeling flushed, nausea and vomiting.  Cardiology consulted and did not believe syncope is of cardiogenic cause, and recommended neuro consult.  MRI of the brain, MRA of the head and neck, EEG all wnl.  Orthostatic BP measurement done and was neg.  Pt was monitored on tele and no remarkable events noted.  Pt was hypertensive to 170's without 2 of her home BP meds, so syncope was unlikely to be due to BP medications dropping BP too much at home.  No clear etiology of pt's syncope was found.  Pt was advised to sit down when she feels the prodrome  of syncope to avoid falling and hurting herself.  Pt's cardiologist Dr. Clayborn Bigness asked that pt stop by his clinic after discharge to have a cardiac monitor placed.  Leukocytosis Likely reactive. No signs or symptoms of infection.  AKI, POA, resolved Cr 1.46 on presentation.  Baseline Cr ~1.  Cr improved to 0.97 after MIVF.  Pre-diabetes A1c 5.9.  No need for fingersticks.    Depression continued on home dose of paroxetine  HTN BP was 170's with just home amlodipine, so pt was discharged on all 3 of her home BP medications (amlodipine, losartan-hydrochlorothiazide).  Obesity: BMI 34.2.  Would benefit from weight loss   Discharge Diagnoses:  Active Problems:   Syncope    Discharge Instructions:  Allergies as of 05/16/2020      Reactions   Penicillins Anaphylaxis   Biaxin [clarithromycin] Other (See Comments)   GI issues   Lisinopril Diarrhea      Medication List    TAKE these medications   amLODipine 5 MG tablet Commonly known as: NORVASC Take 1 tablet (5 mg total) by mouth daily.   aspirin 81 MG tablet Take 81 mg by mouth daily.   cholecalciferol 25 MCG (1000 UNIT) tablet Commonly known as: VITAMIN D3 Take 1,000 Units by mouth daily.   losartan-hydrochlorothiazide 100-25 MG tablet Commonly known as: HYZAAR Take 1 tablet by mouth daily.   multivitamin tablet Take 1 tablet by mouth daily.  PARoxetine 40 MG tablet Commonly known as: PAXIL Take 0.5 tablets (20 mg total) by mouth daily. Take 1/2 dose   vitamin B-12 1000 MCG tablet Commonly known as: CYANOCOBALAMIN Take 1,000 mcg by mouth daily.       Follow-up Information    Venita Lick, NP. Schedule an appointment as soon as possible for a visit in 1 week(s).   Specialty: Nurse Practitioner Contact information: [place] [address] [phone]        [name], NP .   Specialties: Nurse Practitioner, Family Medicine Contact information: [address]  [address] [phone]        Yolonda Kida, MD. Go on 05/16/2020.   Specialties: Cardiology, Internal Medicine Why: to have the cardiac event monitor placed. Contact information: [place] [address] [phone]           Allergies  Allergen Reactions  . Penicillins Anaphylaxis  . Biaxin [Clarithromycin] Other (See Comments)    GI issues  . Lisinopril Diarrhea     The results of significant diagnostics from this hospitalization (including imaging, microbiology, ancillary and laboratory) are listed below for reference.   Consultations:   Procedures/Studies: EEG  Result Date: 05/15/2020 Alexis Goodell, MD     05/15/2020  3:24 PM ELECTROENCEPHALOGRAM REPORT Patient: MAYLEN HAMMAN       Room #: 118A-AA EEG No. ID: [id number] Age: 69 y.o.        Sex: female Requesting Physician: Billie Ruddy Report Date:  05/15/2020       Interpreting Physician: Alexis Goodell History: RYNESHA LAMARQUE is an 69 y.o. female with recurrent syncope Medications: Paxil Conditions of Recording:  This is a 21 channel routine scalp EEG performed with bipolar and monopolar montages arranged in accordance to the international 10/20 system of electrode placement. One channel was dedicated to EKG recording. The patient is in the awake, drowsy and asleep states. Description:  The waking background activity consists of a low voltage, symmetrical, fairly well organized, 8 Hz alpha activity, seen from the parieto-occipital and posterior temporal regions.  Low voltage fast activity, poorly organized, is seen anteriorly and is at times superimposed on more posterior regions.  A mixture of theta and alpha rhythms are seen from the central and temporal regions. The patient drowses with slowing to irregular, low voltage theta and beta activity.  The patient goes in to a light sleep with symmetrical sleep spindles, vertex central sharp transients and irregular slow activity. No epileptiform activity is noted.   Hyperventilation was not performed.  Intermittent photic stimulation was performed but failed to illicit any change in the tracing. IMPRESSION: Normal electroencephalogram, awake, asleep and with activation procedures. There are no focal lateralizing or epileptiform features. Alexis Goodell, MD Neurology [phone] 05/15/2020, 3:23 PM   DG Chest 2 View  Result Date: 05/14/2020 CLINICAL DATA:  Syncope EXAM: CHEST - 2 VIEW COMPARISON:  Apr 28, 2020 FINDINGS: The lungs are clear. The heart size and pulmonary vascularity are normal. No adenopathy. Patient is status post median sternotomy. No pneumothorax. No bone lesions. IMPRESSION: Lungs clear.  Cardiac silhouette within normal limits. Electronically Signed   By: Lowella Grip III M.D.   On: 05/14/2020 11:08   CT HEAD WO CONTRAST  Result Date: 05/14/2020 CLINICAL DATA:  Recurrent syncope EXAM: CT HEAD WITHOUT CONTRAST TECHNIQUE: Contiguous axial images were obtained from the base of the skull through the vertex without intravenous contrast. COMPARISON:  05/11/2020 FINDINGS: Brain: There is no acute  intracranial hemorrhage, mass effect, or edema. Gray-white differentiation is preserved. There is no extra-axial fluid collection. Ventricles and sulci are stable in size and configuration. Vascular: No new finding. Skull: Calvarium is unremarkable. Sinuses/Orbits: No acute finding. Other: None. IMPRESSION: No acute intracranial abnormality. No significant change since 05/11/2020 Electronically Signed   By: Macy Mis M.D.   On: 05/14/2020 15:42   CT Head Wo Contrast  Result Date: 05/11/2020 CLINICAL DATA:  Dizziness. Post syncopal episode. EXAM: CT HEAD WITHOUT CONTRAST TECHNIQUE: Contiguous axial images were obtained from the base of the skull through the vertex without intravenous contrast. COMPARISON:  CT of the head April 05, 2009, MRI of the head September 23, 2017 FINDINGS: Brain: No evidence of acute infarction, hemorrhage, hydrocephalus,  extra-axial collection or mass lesion/mass effect. Vascular: No hyperdense vessel or unexpected calcification. Skull: Normal. Negative for fracture or focal lesion. Sinuses/Orbits: No acute finding. Other: None. IMPRESSION: No acute intracranial abnormality. Electronically Signed   By: Fidela Salisbury M.D.   On: 05/11/2020 16:46   CT Angio Chest PE W/Cm &/Or Wo Cm  Result Date: 05/14/2020 CLINICAL DATA:  Multiple syncopal episodes. EXAM: CT ANGIOGRAPHY CHEST WITH CONTRAST TECHNIQUE: Multidetector CT imaging of the chest was performed using the standard protocol during bolus administration of intravenous contrast. Multiplanar CT image reconstructions and MIPs were obtained to evaluate the vascular anatomy. CONTRAST:  7mL OMNIPAQUE IOHEXOL 350 MG/ML SOLN COMPARISON:  December 02, 2007 FINDINGS: Cardiovascular: Satisfactory opacification of the pulmonary arteries to the segmental level. No evidence of pulmonary embolism. Normal heart size. No pericardial effusion. Mediastinum/Nodes: No enlarged mediastinal, hilar, or axillary lymph nodes. Thyroid gland, trachea, and esophagus demonstrate no significant findings. Lungs/Pleura: Very mild right apical atelectasis is seen. There is no evidence of a pleural effusion or pneumothorax. Upper Abdomen: Multiple surgical clips are seen within the gallbladder fossa. Musculoskeletal: Multiple sternal wires are present. No acute or significant osseous findings. Review of the MIP images confirms the above findings. IMPRESSION: 1. No evidence of pulmonary embolus or acute cardiopulmonary disease. 2. Very mild right apical atelectasis. 3. Evidence of prior median sternotomy and prior cholecystectomy. Electronically Signed   By: Virgina Norfolk M.D.   On: 05/14/2020 17:47   MR ANGIO HEAD WO CONTRAST  Result Date: 05/15/2020 CLINICAL DATA:  Recurrent syncope EXAM: MRI HEAD WITHOUT CONTRAST MRA HEAD WITHOUT CONTRAST MRA NECK WITHOUT AND WITH CONTRAST TECHNIQUE: Multiplanar,  multiecho pulse sequences of the brain and surrounding structures were obtained without and with intravenous contrast. Angiographic images of the Circle of Willis were obtained using MRA technique without intravenous contrast. Angiographic images of the neck were obtained using MRA technique without and with intravenous contrast. Carotid stenosis measurements (when applicable) are obtained utilizing NASCET criteria, using the distal internal carotid diameter as the denominator. CONTRAST:  MRI brain 2018 COMPARISON:  None. FINDINGS: MRI HEAD Brain: There is no acute infarction or intracranial hemorrhage. There is no intracranial mass, mass effect, or edema. There is no hydrocephalus or extra-axial fluid collection. Patchy foci of T2 hyperintensity in the supratentorial white matter are nonspecific but may reflect mild chronic microvascular ischemic changes. Ventricles and sulci are slightly prominent compatible with minor generalized parenchymal volume loss. Vascular: Major vessel flow voids at the skull base are preserved. Skull and upper cervical spine: Normal marrow signal is preserved. Sinuses/Orbits: Paranasal sinuses are aerated. Orbits are unremarkable. Other: Sella is unremarkable.  Trace mastoid fluid opacification. MRA HEAD Intracranial internal carotid arteries are patent. Middle and anterior cerebral arteries are patent. Intracranial  vertebral arteries, basilar artery, posterior cerebral arteries are patent. There is no significant stenosis or aneurysm. MRA NECK Common, internal, and external carotid arteries are patent. Extracranial vertebral arteries are patent. Left vertebral artery is slightly dominant. There is no hemodynamically significant stenosis or evidence of dissection. IMPRESSION: No evidence of recent infarction, hemorrhage, or mass. Mild chronic microvascular ischemic changes. No large vessel occlusion, hemodynamically significant stenosis, or evidence of dissection. Electronically Signed    By: Macy Mis M.D.   On: 05/15/2020 12:52   MR ANGIO NECK W WO CONTRAST  Result Date: 05/15/2020 CLINICAL DATA:  Recurrent syncope EXAM: MRI HEAD WITHOUT CONTRAST MRA HEAD WITHOUT CONTRAST MRA NECK WITHOUT AND WITH CONTRAST TECHNIQUE: Multiplanar, multiecho pulse sequences of the brain and surrounding structures were obtained without and with intravenous contrast. Angiographic images of the Circle of Willis were obtained using MRA technique without intravenous contrast. Angiographic images of the neck were obtained using MRA technique without and with intravenous contrast. Carotid stenosis measurements (when applicable) are obtained utilizing NASCET criteria, using the distal internal carotid diameter as the denominator. CONTRAST:  MRI brain 2018 COMPARISON:  None. FINDINGS: MRI HEAD Brain: There is no acute infarction or intracranial hemorrhage. There is no intracranial mass, mass effect, or edema. There is no hydrocephalus or extra-axial fluid collection. Patchy foci of T2 hyperintensity in the supratentorial white matter are nonspecific but may reflect mild chronic microvascular ischemic changes. Ventricles and sulci are slightly prominent compatible with minor generalized parenchymal volume loss. Vascular: Major vessel flow voids at the skull base are preserved. Skull and upper cervical spine: Normal marrow signal is preserved. Sinuses/Orbits: Paranasal sinuses are aerated. Orbits are unremarkable. Other: Sella is unremarkable.  Trace mastoid fluid opacification. MRA HEAD Intracranial internal carotid arteries are patent. Middle and anterior cerebral arteries are patent. Intracranial vertebral arteries, basilar artery, posterior cerebral arteries are patent. There is no significant stenosis or aneurysm. MRA NECK Common, internal, and external carotid arteries are patent. Extracranial vertebral arteries are patent. Left vertebral artery is slightly dominant. There is no hemodynamically significant  stenosis or evidence of dissection. IMPRESSION: No evidence of recent infarction, hemorrhage, or mass. Mild chronic microvascular ischemic changes. No large vessel occlusion, hemodynamically significant stenosis, or evidence of dissection. Electronically Signed   By: Macy Mis M.D.   On: 05/15/2020 12:52   MR BRAIN WO CONTRAST  Result Date: 05/15/2020 CLINICAL DATA:  Recurrent syncope EXAM: MRI HEAD WITHOUT CONTRAST MRA HEAD WITHOUT CONTRAST MRA NECK WITHOUT AND WITH CONTRAST TECHNIQUE: Multiplanar, multiecho pulse sequences of the brain and surrounding structures were obtained without and with intravenous contrast. Angiographic images of the Circle of Willis were obtained using MRA technique without intravenous contrast. Angiographic images of the neck were obtained using MRA technique without and with intravenous contrast. Carotid stenosis measurements (when applicable) are obtained utilizing NASCET criteria, using the distal internal carotid diameter as the denominator. CONTRAST:  MRI brain 2018 COMPARISON:  None. FINDINGS: MRI HEAD Brain: There is no acute infarction or intracranial hemorrhage. There is no intracranial mass, mass effect, or edema. There is no hydrocephalus or extra-axial fluid collection. Patchy foci of T2 hyperintensity in the supratentorial white matter are nonspecific but may reflect mild chronic microvascular ischemic changes. Ventricles and sulci are slightly prominent compatible with minor generalized parenchymal volume loss. Vascular: Major vessel flow voids at the skull base are preserved. Skull and upper cervical spine: Normal marrow signal is preserved. Sinuses/Orbits: Paranasal sinuses are aerated. Orbits are unremarkable. Other: Sella is unremarkable.  Trace mastoid  fluid opacification. MRA HEAD Intracranial internal carotid arteries are patent. Middle and anterior cerebral arteries are patent. Intracranial vertebral arteries, basilar artery, posterior cerebral arteries are  patent. There is no significant stenosis or aneurysm. MRA NECK Common, internal, and external carotid arteries are patent. Extracranial vertebral arteries are patent. Left vertebral artery is slightly dominant. There is no hemodynamically significant stenosis or evidence of dissection. IMPRESSION: No evidence of recent infarction, hemorrhage, or mass. Mild chronic microvascular ischemic changes. No large vessel occlusion, hemodynamically significant stenosis, or evidence of dissection. Electronically Signed   By: Macy Mis M.D.   On: 05/15/2020 12:52   CT ABDOMEN PELVIS W CONTRAST  Result Date: 04/28/2020 CLINICAL DATA:  Nausea and vomiting. EXAM: CT ABDOMEN AND PELVIS WITH CONTRAST TECHNIQUE: Multidetector CT imaging of the abdomen and pelvis was performed using the standard protocol following bolus administration of intravenous contrast. CONTRAST:  183mL OMNIPAQUE IOHEXOL 300 MG/ML  SOLN COMPARISON:  April 01, 2014 FINDINGS: Lower chest: No acute abnormality. Hepatobiliary: There is mild diffuse fatty infiltration of the liver parenchyma. No focal liver abnormality is seen. Status post cholecystectomy. No biliary dilatation. Pancreas: Unremarkable. No pancreatic ductal dilatation or surrounding inflammatory changes. Spleen: Normal in size without focal abnormality. Adrenals/Urinary Tract: Adrenal glands are unremarkable. Kidneys are normal, without renal calculi, focal lesion, or hydronephrosis. Bladder is unremarkable. Stomach/Bowel: Stomach is within normal limits. Appendix appears normal. No evidence of bowel wall thickening, distention, or inflammatory changes. Vascular/Lymphatic: No significant vascular findings are present. No enlarged abdominal or pelvic lymph nodes. Reproductive: Uterus and bilateral adnexa are unremarkable. Other: No abdominal wall hernia or abnormality. No abdominopelvic ascites. Musculoskeletal: No acute or significant osseous findings. IMPRESSION: 1. Fatty liver. 2. Status post  cholecystectomy. Electronically Signed   By: Virgina Norfolk M.D.   On: 04/28/2020 16:05   DG Chest Portable 1 View  Result Date: 04/28/2020 CLINICAL DATA:  Cough. Additional history provided: New onset nausea today, near syncope, productive cough for 2 days, history of asthma and CHF. EXAM: PORTABLE CHEST 1 VIEW COMPARISON:  Chest CT 12/03/2007, chest radiograph 12/02/2007 FINDINGS: Prior median sternotomy. Unchanged cardiomegaly. There is central pulmonary vascular congestion. There are subtle interstitial opacities at the right lung base. The left lung is clear. No evidence of pleural effusion or pneumothorax. No acute bony abnormality identified. IMPRESSION: Cardiomegaly with central pulmonary vascular congestion. Subtle interstitial opacities at the right lung base which may reflect atelectasis or interstitial edema. Atypical/viral pneumonia is difficult to exclude. Electronically Signed   By: Kellie Simmering DO   On: 04/28/2020 14:20   ECHOCARDIOGRAM COMPLETE  Result Date: 05/15/2020    ECHOCARDIOGRAM REPORT   Patient Name:   CHARNETTE HJERPE Date of Exam: 05/14/2020 Medical Rec #:  [id number]      Height:       61.0 in Accession #:    [id number]     Weight:       181.2 lb Date of Birth:  30-Jan-1951      BSA:          1.811 m Patient Age:    50 years       BP:           118/67 mmHg Patient Gender: F              HR:           82 bpm. Exam Location:  [facility] Procedure: 2D Echo, Cardiac Doppler and Color Doppler STAT ECHO Indications:     Syncope 780.2  History:         Patient has no prior history of Echocardiogram examinations.                  CHF; Risk Factors:Hypertension.  Sonographer:     Sherrie Sport RDCS (AE) Referring Phys:  Lynn L. MONKS Diagnosing Phys: Yolonda Kida MD  Sonographer Comments: Suboptimal apical window. IMPRESSIONS  1. Left ventricular ejection fraction, by estimation, is 60 to 65%. The left ventricle has normal function. The left ventricle has no regional wall motion  abnormalities. Left ventricular diastolic parameters are consistent with Grade I diastolic dysfunction (impaired relaxation).  2. Right ventricular systolic function is normal. The right ventricular size is normal. There is normal pulmonary artery systolic pressure.  3. The mitral valve is grossly normal. Trivial mitral valve regurgitation.  4. The aortic valve is normal in structure. Aortic valve regurgitation is not visualized. FINDINGS  Left Ventricle: Left ventricular ejection fraction, by estimation, is 60 to 65%. The left ventricle has normal function. The left ventricle has no regional wall motion abnormalities. The left ventricular internal cavity size was normal in size. There is  no left ventricular hypertrophy. Left ventricular diastolic parameters are consistent with Grade I diastolic dysfunction (impaired relaxation). Right Ventricle: The right ventricular size is normal. No increase in right ventricular wall thickness. Right ventricular systolic function is normal. There is normal pulmonary artery systolic pressure. The tricuspid regurgitant velocity is 1.90 m/s, and  with an assumed right atrial pressure of 10 mmHg, the estimated right ventricular systolic pressure is 99991111 mmHg. Left Atrium: Left atrial size was normal in size. Right Atrium: Right atrial size was normal in size. Pericardium: There is no evidence of pericardial effusion. Mitral Valve: The mitral valve is grossly normal. Trivial mitral valve regurgitation. Tricuspid Valve: The tricuspid valve is normal in structure. Tricuspid valve regurgitation is trivial. Aortic Valve: The aortic valve is normal in structure. Aortic valve regurgitation is not visualized. Aortic valve mean gradient measures 6.3 mmHg. Aortic valve peak gradient measures 11.5 mmHg. Aortic valve area, by VTI measures 1.96 cm. Pulmonic Valve: The pulmonic valve was normal in structure. Pulmonic valve regurgitation is not visualized. Aorta: The aortic root is normal in  size and structure. IAS/Shunts: No atrial level shunt detected by color flow Doppler.  LEFT VENTRICLE PLAX 2D LVIDd:         3.70 cm  Diastology LVIDs:         2.14 cm  LV e' lateral:   5.22 cm/s LV PW:         1.34 cm  LV E/e' lateral: 15.0 LV IVS:        1.40 cm  LV e' medial:    5.77 cm/s LVOT diam:     2.00 cm  LV E/e' medial:  13.6 LV SV:         59 LV SV Index:   33 LVOT Area:     3.14 cm  RIGHT VENTRICLE RV Basal diam:  2.30 cm LEFT ATRIUM             Index       RIGHT ATRIUM           Index LA diam:        4.30 cm 2.37 cm/m  RA Area:     19.70 cm LA Vol (A2C):   43.2 ml 23.85 ml/m RA Volume:   58.20 ml  32.13 ml/m LA Vol (A4C):   68.2 ml 37.66 ml/m  LA Biplane Vol: 55.5 ml 30.64 ml/m  AORTIC VALVE                    PULMONIC VALVE AV Area (Vmax):    1.89 cm     PV Vmax:        0.92 m/s AV Area (Vmean):   1.76 cm     PV Peak grad:   3.4 mmHg AV Area (VTI):     1.96 cm     RVOT Peak grad: 4 mmHg AV Vmax:           169.67 cm/s AV Vmean:          114.667 cm/s AV VTI:            0.303 m AV Peak Grad:      11.5 mmHg AV Mean Grad:      6.3 mmHg LVOT Vmax:         102.00 cm/s LVOT Vmean:        64.100 cm/s LVOT VTI:          0.189 m LVOT/AV VTI ratio: 0.62  AORTA Ao Root diam: 2.50 cm MITRAL VALVE               TRICUSPID VALVE MV Area (PHT): 4.13 cm    TR Peak grad:   14.4 mmHg MV Decel Time: 184 msec    TR Vmax:        190.00 cm/s MV E velocity: 78.30 cm/s MV A velocity: 96.55 cm/s  SHUNTS MV E/A ratio:  0.81        Systemic VTI:  0.19 m                            Systemic Diam: 2.00 cm Dwayne D Callwood MD Electronically signed by Yolonda Kida MD Signature Date/Time: 05/15/2020/3:19:59 PM    Final       Labs: BNP (last 3 results) No results for input(s): BNP in the last 8760 hours. Basic Metabolic Panel: Recent Labs  Lab 05/11/20 1500 05/14/20 1020 05/15/20 0434 05/16/20 0428  NA 136 138 138 140  K 3.9 3.6 3.9 3.7  CL 98 103 106 106  CO2 27 25 26 27   GLUCOSE 119* 142* 113* 109*    BUN 14 16 15 11   CREATININE 1.79* 1.46* 0.97 0.79  CALCIUM 9.7 9.8 9.0 9.0  MG  --   --  2.2 2.1   Liver Function Tests: Recent Labs  Lab 05/11/20 1500 05/14/20 1020  AST 26 34  ALT 21 28  ALKPHOS 99 95  BILITOT 0.9 0.9  PROT 8.4* 8.4*  ALBUMIN 4.2 4.4   No results for input(s): LIPASE, AMYLASE in the last 168 hours. No results for input(s): AMMONIA in the last 168 hours. CBC: Recent Labs  Lab 05/11/20 1500 05/14/20 1020 05/15/20 0434 05/16/20 0428  WBC 11.4* 11.5* 9.4 7.1  NEUTROABS 7.8*  --   --   --   HGB 13.7 14.0 12.3 11.8*  HCT 40.7 42.6 36.3 36.9  MCV 82.7 85.0 83.6 86.0  PLT 304 355 220 224   Cardiac Enzymes: No results for input(s): CKTOTAL, CKMB, CKMBINDEX, TROPONINI in the last 168 hours. BNP: Invalid input(s): POCBNP CBG: Recent Labs  Lab 05/14/20 1024  GLUCAP 135*   D-Dimer No results for input(s): DDIMER in the last 72 hours. Hgb A1c Recent Labs    05/15/20 0434  HGBA1C 5.9*   Lipid  Profile No results for input(s): CHOL, HDL, LDLCALC, TRIG, CHOLHDL, LDLDIRECT in the last 72 hours. Thyroid function studies No results for input(s): TSH, T4TOTAL, T3FREE, THYROIDAB in the last 72 hours.  Invalid input(s): FREET3 Anemia work up No results for input(s): VITAMINB12, FOLATE, FERRITIN, TIBC, IRON, RETICCTPCT in the last 72 hours. Urinalysis    Component Value Date/Time   COLORURINE YELLOW (A) 04/28/2020 1417   APPEARANCEUR CLEAR (A) 04/28/2020 1417   APPEARANCEUR HAZY 04/01/2014 1633   LABSPEC >1.046 (H) 04/28/2020 1417   LABSPEC 1.025 04/01/2014 1633   PHURINE 6.0 04/28/2020 1417   GLUCOSEU NEGATIVE 04/28/2020 1417   GLUCOSEU NEGATIVE 04/01/2014 1633   HGBUR NEGATIVE 04/28/2020 1417   BILIRUBINUR NEGATIVE 04/28/2020 1417   BILIRUBINUR NEGATIVE 04/01/2014 1633   KETONESUR 5 (A) 04/28/2020 1417   PROTEINUR NEGATIVE 04/28/2020 1417   NITRITE NEGATIVE 04/28/2020 1417   LEUKOCYTESUR TRACE (A) 04/28/2020 1417   LEUKOCYTESUR NEGATIVE  04/01/2014 1633   Sepsis Labs Invalid input(s): PROCALCITONIN,  WBC,  LACTICIDVEN Microbiology Recent Results (from the past 240 hour(s))  SARS Coronavirus 2 by RT PCR (hospital order, performed in [place] hospital lab) Nasopharyngeal Nasopharyngeal Swab     Status: None   Collection Time: 05/14/20  2:54 PM   Specimen: Nasopharyngeal Swab  Result Value Ref Range Status   SARS Coronavirus 2 NEGATIVE NEGATIVE Final    Comment: (NOTE) SARS-CoV-2 target nucleic acids are NOT DETECTED. The SARS-CoV-2 RNA is generally detectable in upper and lower respiratory specimens during the acute phase of infection. The lowest concentration of SARS-CoV-2 viral copies this assay can detect is 250 copies / mL. A negative result does not preclude SARS-CoV-2 infection and should not be used as the sole basis for treatment or other patient management decisions.  A negative result may occur with improper specimen collection / handling, submission of specimen other than nasopharyngeal swab, presence of viral mutation(s) within the areas targeted by this assay, and inadequate number of viral copies (<250 copies / mL). A negative result must be combined with clinical observations, patient history, and epidemiological information. Fact Sheet for Patients:   StrictlyIdeas.no Fact Sheet for Healthcare Providers: BankingDealers.co.za This test is not yet approved or cleared  by the [place] FDA and has been authorized for detection and/or diagnosis of SARS-CoV-2 by FDA under an Emergency Use Authorization (EUA).  This EUA will remain in effect (meaning this test can be used) for the duration of the COVID-19 declaration under Section 564(b)(1) of the Act, 21 U.S.C. section 360bbb-3(b)(1), unless the authorization is terminated or revoked sooner. Performed at [facility], [address] [place],  [address]      Total time spend on  discharging this patient, including the last patient exam, discussing the hospital stay, instructions for ongoing care as it relates to all pertinent caregivers, as well as preparing the medical discharge records, prescriptions, and/or referrals as applicable, is 50 minutes.    Enzo Bi, MD  [facility] 05/16/2020, 8:24 AM  If 7PM-7AM, please contact night-coverage

## 2020-05-17 ENCOUNTER — Encounter: Payer: Self-pay | Admitting: Nurse Practitioner

## 2020-05-17 ENCOUNTER — Other Ambulatory Visit: Payer: Self-pay

## 2020-05-17 ENCOUNTER — Ambulatory Visit (INDEPENDENT_AMBULATORY_CARE_PROVIDER_SITE_OTHER): Payer: Medicare Other | Admitting: Nurse Practitioner

## 2020-05-17 VITALS — BP 134/81 | HR 75 | Temp 98.4°F | Wt 176.4 lb

## 2020-05-17 DIAGNOSIS — R55 Syncope and collapse: Secondary | ICD-10-CM | POA: Diagnosis not present

## 2020-05-17 DIAGNOSIS — I10 Essential (primary) hypertension: Secondary | ICD-10-CM

## 2020-05-17 DIAGNOSIS — R7309 Other abnormal glucose: Secondary | ICD-10-CM

## 2020-05-17 DIAGNOSIS — E782 Mixed hyperlipidemia: Secondary | ICD-10-CM

## 2020-05-17 DIAGNOSIS — D151 Benign neoplasm of heart: Secondary | ICD-10-CM

## 2020-05-17 MED ORDER — ROSUVASTATIN CALCIUM 10 MG PO TABS
ORAL_TABLET | ORAL | 3 refills | Status: DC
Start: 2020-05-17 — End: 2021-05-09

## 2020-05-17 MED ORDER — ROSUVASTATIN CALCIUM 10 MG PO TABS: 10.0000 mg | ORAL_TABLET | Freq: Every day | ORAL | 3 refills | Status: DC

## 2020-05-17 NOTE — Assessment & Plan Note (Signed)
Chronic, ongoing.  Continue Crestor 10 MG Monday/Wednesday/Friday schedule.  Obtain lipid panel next visit and educated her on importance of taking this medication as scheduled.  Plan to return in 6 weeks for follow-up and will recheck labs.

## 2020-05-17 NOTE — Assessment & Plan Note (Signed)
Recently 5.9% in hospital, continue diet focus and recheck in 3-6 months.

## 2020-05-17 NOTE — Progress Notes (Signed)
BP 134/81   Pulse 75   Temp 98.4 F (36.9 C) (Oral)   Wt 176 lb 6.4 oz (80 kg)   LMP  (LMP Unknown)   SpO2 96%   BMI 33.33 kg/m    Subjective:    Patient ID: Lisa Bass, female    DOB: 1951-05-18, 69 y.o.   MRN: [id number]  HPI: GERALENE Bass is a 69 y.o. female  Chief Complaint  Patient presents with  . Hospitalization Follow-up   Transition of Care Hospital Follow up.  Was admitted to [facility] on 05/14/20 after passing out in cardiology office.  She had been at initial visit with NP white at [facility] when episode took place.  Was seen by NP white on 05/14/20 and a 30 day heart monitor was placed + BP medication was changed to Losartan 50 MG-HCTZ 12.5 MG.  Has history of myxoma excision in 2007, recent echo in hospital showed no return of this.  She denies any further dizzy episodes.  Reports feeling a lot better.  Drinking lots of fluids at home.  Labs in hospital did note prediabetic A1C at 5.9%, which she is aware of.  Normal kidney function and electrolytes. HGB 11.8/HCT 36.9, MCV 86.0.   The 10-year ASCVD risk score [name] DC Brooke Bonito., et al., 2013) is: 13.4%   Values used to calculate the score:     Age: 48 years     Sex: Female     Is Non-Hispanic African American: No     Diabetic: No     Tobacco smoker: No     Systolic Blood Pressure: [id number] mmHg     Is BP treated: Yes     HDL Cholesterol: 47 mg/dL     Total Cholesterol: 229 mg/dL   "HPI: 69 y/o M w/ PMH of HTN, depression, obesity, myxoma s/p removal (2007) who presents w/ syncope x day of admission. This is the third episode of syncope. Pt was at Dr. Etta Quill office and loss consciousness for approx 30 secs as per pt's family member at bedside. Pt was sent to the ER as pt has hx of myxoma s/p surgical resection in 2007. Prior to the syncopal episode, pt c/o dizziness, nausea, vomiting, feeling hot & shortness of breath. Pt denies any hx of palpitations, a. fib/flutter. Pt currently denies any fevers, chills,  sweating, cough, chest pain, abd pain, dysuria, urinary urgency, urinary frequency, diarrhea, or constipation. "   Hospital/Facility: [facility] D/C Physician: Dr. Billie Ruddy D/C Date: 05/16/20  Records Requested: 05/17/20 Records Received: 05/17/20 Records Reviewed: 05/17/20  Diagnoses on Discharge:  Syncope  Date of interactive Contact within 48 hours of discharge:  Contact was through: direct  Date of 7 day or 14 day face-to-face visit:    within 7 days  Outpatient Encounter Medications as of 05/17/2020  Medication Sig  . amLODipine (NORVASC) 5 MG tablet Take 1 tablet (5 mg total) by mouth daily.  Marland Kitchen aspirin 81 MG tablet Take 81 mg by mouth daily.  . cholecalciferol (VITAMIN D3) 25 MCG (1000 UT) tablet Take 1,000 Units by mouth daily.  Marland Kitchen losartan-hydrochlorothiazide (HYZAAR) 50-12.5 MG tablet Take 1 tablet by mouth daily.  . Multiple Vitamin (MULTIVITAMIN) tablet Take 1 tablet by mouth daily.  Marland Kitchen PARoxetine (PAXIL) 40 MG tablet Take 0.5 tablets (20 mg total) by mouth daily. Take 1/2 dose  . vitamin B-12 (CYANOCOBALAMIN) 1000 MCG tablet Take 1,000 mcg by mouth daily.  . [DISCONTINUED] losartan-hydrochlorothiazide (HYZAAR) 100-25 MG tablet Take 1 tablet by mouth  daily.  . rosuvastatin (CRESTOR) 10 MG tablet Take 1 tablet (10 MG) by mouth on Monday, Wednesday, and Friday.  . [DISCONTINUED] rosuvastatin (CRESTOR) 10 MG tablet Take 1 tablet (10 mg total) by mouth daily.   No facility-administered encounter medications on file as of 05/17/2020.    Diagnostic Tests Reviewed/Disposition:   CLINICAL DATA:  Recurrent syncope  EXAM: MRI HEAD WITHOUT CONTRAST  MRA HEAD WITHOUT CONTRAST  MRA NECK WITHOUT AND WITH CONTRAST  TECHNIQUE: Multiplanar, multiecho pulse sequences of the brain and surrounding structures were obtained without and with intravenous contrast. Angiographic images of the Circle of Willis were obtained using MRA technique without intravenous contrast. Angiographic images of  the neck were obtained using MRA technique without and with intravenous contrast. Carotid stenosis measurements (when applicable) are obtained utilizing NASCET criteria, using the distal internal carotid diameter as the denominator.  CONTRAST:  MRI brain 2018  COMPARISON:  None.  FINDINGS: MRI HEAD  Brain: There is no acute infarction or intracranial hemorrhage. There is no intracranial mass, mass effect, or edema. There is no hydrocephalus or extra-axial fluid collection. Patchy foci of T2 hyperintensity in the supratentorial white matter are nonspecific but may reflect mild chronic microvascular ischemic changes. Ventricles and sulci are slightly prominent compatible with minor generalized parenchymal volume loss.  Vascular: Major vessel flow voids at the skull base are preserved.  Skull and upper cervical spine: Normal marrow signal is preserved.  Sinuses/Orbits: Paranasal sinuses are aerated. Orbits are unremarkable.  Other: Sella is unremarkable.  Trace mastoid fluid opacification.  MRA HEAD  Intracranial internal carotid arteries are patent. Middle and anterior cerebral arteries are patent. Intracranial vertebral arteries, basilar artery, posterior cerebral arteries are patent. There is no significant stenosis or aneurysm.  MRA NECK  Common, internal, and external carotid arteries are patent. Extracranial vertebral arteries are patent. Left vertebral artery is slightly dominant. There is no hemodynamically significant stenosis or evidence of dissection.  IMPRESSION: No evidence of recent infarction, hemorrhage, or mass. Mild chronic microvascular ischemic changes.  No large vessel occlusion, hemodynamically significant stenosis, or evidence of dissection.   Electronically Signed   By: Macy Mis M.D.   On: 05/15/2020 12:52  CLINICAL DATA:  Recurrent syncope  EXAM: MRI HEAD WITHOUT CONTRAST  MRA HEAD WITHOUT CONTRAST  MRA NECK  WITHOUT AND WITH CONTRAST  TECHNIQUE: Multiplanar, multiecho pulse sequences of the brain and surrounding structures were obtained without and with intravenous contrast. Angiographic images of the Circle of Willis were obtained using MRA technique without intravenous contrast. Angiographic images of the neck were obtained using MRA technique without and with intravenous contrast. Carotid stenosis measurements (when applicable) are obtained utilizing NASCET criteria, using the distal internal carotid diameter as the denominator.  CONTRAST:  MRI brain 2018  COMPARISON:  None.  FINDINGS: MRI HEAD  Brain: There is no acute infarction or intracranial hemorrhage. There is no intracranial mass, mass effect, or edema. There is no hydrocephalus or extra-axial fluid collection. Patchy foci of T2 hyperintensity in the supratentorial white matter are nonspecific but may reflect mild chronic microvascular ischemic changes. Ventricles and sulci are slightly prominent compatible with minor generalized parenchymal volume loss.  Vascular: Major vessel flow voids at the skull base are preserved.  Skull and upper cervical spine: Normal marrow signal is preserved.  Sinuses/Orbits: Paranasal sinuses are aerated. Orbits are unremarkable.  Other: Sella is unremarkable.  Trace mastoid fluid opacification.  MRA HEAD  Intracranial internal carotid arteries are patent. Middle and anterior cerebral arteries are patent.  Intracranial vertebral arteries, basilar artery, posterior cerebral arteries are patent. There is no significant stenosis or aneurysm.  MRA NECK  Common, internal, and external carotid arteries are patent. Extracranial vertebral arteries are patent. Left vertebral artery is slightly dominant. There is no hemodynamically significant stenosis or evidence of dissection.  IMPRESSION: No evidence of recent infarction, hemorrhage, or mass. Mild chronic microvascular  ischemic changes.  No large vessel occlusion, hemodynamically significant stenosis, or evidence of dissection.   Electronically Signed   By: Macy Mis M.D.   On: 05/15/2020 12:52  CLINICAL DATA:  Recurrent syncope  EXAM: MRI HEAD WITHOUT CONTRAST  MRA HEAD WITHOUT CONTRAST  MRA NECK WITHOUT AND WITH CONTRAST  TECHNIQUE: Multiplanar, multiecho pulse sequences of the brain and surrounding structures were obtained without and with intravenous contrast. Angiographic images of the Circle of Willis were obtained using MRA technique without intravenous contrast. Angiographic images of the neck were obtained using MRA technique without and with intravenous contrast. Carotid stenosis measurements (when applicable) are obtained utilizing NASCET criteria, using the distal internal carotid diameter as the denominator.  CONTRAST:  MRI brain 2018  COMPARISON:  None.  FINDINGS: MRI HEAD  Brain: There is no acute infarction or intracranial hemorrhage. There is no intracranial mass, mass effect, or edema. There is no hydrocephalus or extra-axial fluid collection. Patchy foci of T2 hyperintensity in the supratentorial white matter are nonspecific but may reflect mild chronic microvascular ischemic changes. Ventricles and sulci are slightly prominent compatible with minor generalized parenchymal volume loss.  Vascular: Major vessel flow voids at the skull base are preserved.  Skull and upper cervical spine: Normal marrow signal is preserved.  Sinuses/Orbits: Paranasal sinuses are aerated. Orbits are unremarkable.  Other: Sella is unremarkable.  Trace mastoid fluid opacification.  MRA HEAD  Intracranial internal carotid arteries are patent. Middle and anterior cerebral arteries are patent. Intracranial vertebral arteries, basilar artery, posterior cerebral arteries are patent. There is no significant stenosis or aneurysm.  MRA NECK  Common, internal,  and external carotid arteries are patent. Extracranial vertebral arteries are patent. Left vertebral artery is slightly dominant. There is no hemodynamically significant stenosis or evidence of dissection.  IMPRESSION: No evidence of recent infarction, hemorrhage, or mass. Mild chronic microvascular ischemic changes.  No large vessel occlusion, hemodynamically significant stenosis, or evidence of dissection.   Electronically Signed   By: Macy Mis M.D.   On: 05/15/2020 12:52  CLINICAL DATA:  Multiple syncopal episodes.  EXAM: CT ANGIOGRAPHY CHEST WITH CONTRAST  TECHNIQUE: Multidetector CT imaging of the chest was performed using the standard protocol during bolus administration of intravenous contrast. Multiplanar CT image reconstructions and MIPs were obtained to evaluate the vascular anatomy.  CONTRAST:  71mL OMNIPAQUE IOHEXOL 350 MG/ML SOLN  COMPARISON:  December 02, 2007  FINDINGS: Cardiovascular: Satisfactory opacification of the pulmonary arteries to the segmental level. No evidence of pulmonary embolism. Normal heart size. No pericardial effusion.  Mediastinum/Nodes: No enlarged mediastinal, hilar, or axillary lymph nodes. Thyroid gland, trachea, and esophagus demonstrate no significant findings.  Lungs/Pleura: Very mild right apical atelectasis is seen.  There is no evidence of a pleural effusion or pneumothorax.  Upper Abdomen: Multiple surgical clips are seen within the gallbladder fossa.  Musculoskeletal: Multiple sternal wires are present. No acute or significant osseous findings.  Review of the MIP images confirms the above findings.  IMPRESSION: 1. No evidence of pulmonary embolus or acute cardiopulmonary disease. 2. Very mild right apical atelectasis. 3. Evidence of prior median sternotomy and prior cholecystectomy.  Electronically Signed   By: Virgina Norfolk M.D.   On: 05/14/2020 17:47  Consults: Cardiology  and neurology  Discharge Instructions: Follow-up with PCP and cardiology  Disease/illness Education: reviewed with patient  Home Health/Community Services Discussions/Referrals: none  Establishment or re-establishment of referral orders for community resources: none  Discussion with other health care providers: reviewed cardiology note  Assessment and Support of treatment regimen adherence: reviewed medication list with patient  Appointments Coordinated with: patient  Education for self-management, independent living, and ADLs: reviewed with patient  HYPERTENSION / HYPERLIPIDEMIA Currently taking Losartan 50-HCTZ 12.5 MG daily.  Was to be taking Crestor 10 MG, but has never taken, would like to start this.  Endorses she was poorly taking medications prior to these syncopal episodes and feels this may be cause of some of these.  Discussed with her importance of following medication regimen and to report to provider if any issues. Satisfied with current treatment? yes Duration of hypertension: chronic BP monitoring frequency: a few times a week BP range:  BP medication side effects: no Duration of hyperlipidemia: chronic Medication compliance: poor compliance Aspirin: yes Recent stressors: no Recurrent headaches: no Visual changes: no Palpitations: no Dyspnea: no Chest pain: no Lower extremity edema: no Dizzy/lightheaded: no  Relevant past medical, surgical, family and social history reviewed and updated as indicated. Interim medical history since our last visit reviewed. Allergies and medications reviewed and updated.  Review of Systems  Constitutional: Negative for activity change, appetite change, chills, diaphoresis, fatigue and fever.  HENT: Negative.   Respiratory: Negative for cough, chest tightness, shortness of breath and wheezing.   Cardiovascular: Negative for chest pain, palpitations and leg swelling.  Gastrointestinal: Negative.   Neurological: Negative.     Psychiatric/Behavioral: Negative.     Per HPI unless specifically indicated above     Objective:    BP 134/81   Pulse 75   Temp 98.4 F (36.9 C) (Oral)   Wt 176 lb 6.4 oz (80 kg)   LMP  (LMP Unknown)   SpO2 96%   BMI 33.33 kg/m   Wt Readings from Last 3 Encounters:  05/17/20 176 lb 6.4 oz (80 kg)  05/14/20 181 lb 3.1 oz (82.2 kg)  05/11/20 181 lb 3.1 oz (82.2 kg)    Physical Exam Vitals and nursing note reviewed.  Constitutional:      General: She is awake. She is not in acute distress.    Appearance: She is well-developed and well-groomed. She is obese. She is not ill-appearing.  HENT:     Head: Normocephalic.     Comments: Slight diminished hair pattern scalp.    Right Ear: Hearing normal.     Left Ear: Hearing normal.  Eyes:     General: Lids are normal.        Right eye: No discharge.        Left eye: No discharge.     Conjunctiva/sclera: Conjunctivae normal.     Pupils: Pupils are equal, round, and reactive to light.  Neck:     Thyroid: No thyromegaly.     Vascular: No carotid bruit.  Cardiovascular:     Rate and Rhythm: Normal rate and regular rhythm.     Heart sounds: Normal heart sounds. No murmur. No gallop.   Pulmonary:     Effort: Pulmonary effort is normal. No accessory muscle usage or respiratory distress.     Breath sounds: Normal breath sounds.  Abdominal:     General: Bowel sounds are normal.  Palpations: Abdomen is soft.  Musculoskeletal:     Cervical back: Normal range of motion and neck supple.     Right lower leg: No edema.     Left lower leg: No edema.  Skin:    General: Skin is warm and dry.       Neurological:     Mental Status: She is alert and oriented to person, place, and time.  Psychiatric:        Attention and Perception: Attention normal.        Mood and Affect: Mood normal.        Speech: Speech normal.        Behavior: Behavior normal. Behavior is cooperative.        Thought Content: Thought content normal.      Results for orders placed or performed during the hospital encounter of 05/14/20  SARS Coronavirus 2 by RT PCR (hospital order, performed in [facility] hospital lab) Nasopharyngeal Nasopharyngeal Swab   Specimen: Nasopharyngeal Swab  Result Value Ref Range   SARS Coronavirus 2 NEGATIVE NEGATIVE  CBC  Result Value Ref Range   WBC 11.5 (H) 4.0 - 10.5 K/uL   RBC 5.01 3.87 - 5.11 MIL/uL   Hemoglobin 14.0 12.0 - 15.0 g/dL   HCT 42.6 36.0 - 46.0 %   MCV 85.0 80.0 - 100.0 fL   MCH 27.9 26.0 - 34.0 pg   MCHC 32.9 30.0 - 36.0 g/dL   RDW 13.3 11.5 - 15.5 %   Platelets 355 150 - 400 K/uL   nRBC 0.0 0.0 - 0.2 %  Comprehensive metabolic panel  Result Value Ref Range   Sodium 138 135 - 145 mmol/L   Potassium 3.6 3.5 - 5.1 mmol/L   Chloride 103 98 - 111 mmol/L   CO2 25 22 - 32 mmol/L   Glucose, Bld 142 (H) 70 - 99 mg/dL   BUN 16 8 - 23 mg/dL   Creatinine, Ser 1.46 (H) 0.44 - 1.00 mg/dL   Calcium 9.8 8.9 - 10.3 mg/dL   Total Protein 8.4 (H) 6.5 - 8.1 g/dL   Albumin 4.4 3.5 - 5.0 g/dL   AST 34 15 - 41 U/L   ALT 28 0 - 44 U/L   Alkaline Phosphatase 95 38 - 126 U/L   Total Bilirubin 0.9 0.3 - 1.2 mg/dL   GFR calc non Af Amer 36 (L) >60 mL/min   GFR calc Af Amer 42 (L) >60 mL/min   Anion gap 10 5 - 15  Glucose, capillary  Result Value Ref Range   Glucose-Capillary 135 (H) 70 - 99 mg/dL  Fibrin derivatives D-Dimer ([facility] only)  Result Value Ref Range   Fibrin derivatives D-dimer ([facility]) 539.08 (H) 0.00 - 499.00 ng/mL (FEU)  CBC  Result Value Ref Range   WBC 9.4 4.0 - 10.5 K/uL   RBC 4.34 3.87 - 5.11 MIL/uL   Hemoglobin 12.3 12.0 - 15.0 g/dL   HCT 36.3 36.0 - 46.0 %   MCV 83.6 80.0 - 100.0 fL   MCH 28.3 26.0 - 34.0 pg   MCHC 33.9 30.0 - 36.0 g/dL   RDW 13.2 11.5 - 15.5 %   Platelets 220 150 - 400 K/uL   nRBC 0.0 0.0 - 0.2 %  Basic metabolic panel  Result Value Ref Range   Sodium 138 135 - 145 mmol/L   Potassium 3.9 3.5 - 5.1 mmol/L   Chloride 106 98 - 111 mmol/L   CO2 26  22 - 32 mmol/L  Glucose, Bld 113 (H) 70 - 99 mg/dL   BUN 15 8 - 23 mg/dL   Creatinine, Ser 0.97 0.44 - 1.00 mg/dL   Calcium 9.0 8.9 - 10.3 mg/dL   GFR calc non Af Amer 60 (L) >60 mL/min   GFR calc Af Amer >60 >60 mL/min   Anion gap 6 5 - 15  Magnesium  Result Value Ref Range   Magnesium 2.2 1.7 - 2.4 mg/dL  Hemoglobin A1c  Result Value Ref Range   Hgb A1c MFr Bld 5.9 (H) 4.8 - 5.6 %   Mean Plasma Glucose 122.63 mg/dL  Basic metabolic panel  Result Value Ref Range   Sodium 140 135 - 145 mmol/L   Potassium 3.7 3.5 - 5.1 mmol/L   Chloride 106 98 - 111 mmol/L   CO2 27 22 - 32 mmol/L   Glucose, Bld 109 (H) 70 - 99 mg/dL   BUN 11 8 - 23 mg/dL   Creatinine, Ser 0.79 0.44 - 1.00 mg/dL   Calcium 9.0 8.9 - 10.3 mg/dL   GFR calc non Af Amer >60 >60 mL/min   GFR calc Af Amer >60 >60 mL/min   Anion gap 7 5 - 15  CBC  Result Value Ref Range   WBC 7.1 4.0 - 10.5 K/uL   RBC 4.29 3.87 - 5.11 MIL/uL   Hemoglobin 11.8 (L) 12.0 - 15.0 g/dL   HCT 36.9 36.0 - 46.0 %   MCV 86.0 80.0 - 100.0 fL   MCH 27.5 26.0 - 34.0 pg   MCHC 32.0 30.0 - 36.0 g/dL   RDW 13.1 11.5 - 15.5 %   Platelets 224 150 - 400 K/uL   nRBC 0.0 0.0 - 0.2 %  Magnesium  Result Value Ref Range   Magnesium 2.1 1.7 - 2.4 mg/dL  ECHOCARDIOGRAM COMPLETE  Result Value Ref Range   Weight 2,899.14 oz   Height 61 in   BP 118/67 mmHg  Troponin I (High Sensitivity)  Result Value Ref Range   Troponin I (High Sensitivity) 4 <18 ng/L  Troponin I (High Sensitivity)  Result Value Ref Range   Troponin I (High Sensitivity) 4 <18 ng/L      Assessment & Plan:   Problem List Items Addressed This Visit      Cardiovascular and Mediastinum   Hypertension    Chronic, stable with BP close to goal today, goal <130/80.  Continue current medication regimen and adjust as needed (Losartan-HCTZ).  Recommend continue to monitor BP daily at home.  Will check BMP next visit.  Recommend focus on DASH diet and modest weight loss.  Continue  collaboration with cardiology, appreciate their input.      Relevant Medications   losartan-hydrochlorothiazide (HYZAAR) 50-12.5 MG tablet   rosuvastatin (CRESTOR) 10 MG tablet   Myxoma of heart    History of in 2007, recent echo in hospital showing no return of this.  Continue collaboration with cardiology.      Relevant Medications   losartan-hydrochlorothiazide (HYZAAR) 50-12.5 MG tablet   rosuvastatin (CRESTOR) 10 MG tablet   Syncope - Primary    Acute and improved at this time.  Continue lower dose of BP medication as prescribed by cardiology and collaboration with them.  Recommend patient take medication as ordered daily and not to miss doses.  Could consider in future switch off Paxil and to alternate SSRI due to age >56.  Have discussed at length with patient and she would like to wait on this change.  Relevant Medications   losartan-hydrochlorothiazide (HYZAAR) 50-12.5 MG tablet   rosuvastatin (CRESTOR) 10 MG tablet     Other   Hyperlipidemia    Chronic, ongoing.  Continue Crestor 10 MG Monday/Wednesday/Friday schedule.  Obtain lipid panel next visit and educated her on importance of taking this medication as scheduled.  Plan to return in 6 weeks for follow-up and will recheck labs.      Relevant Medications   losartan-hydrochlorothiazide (HYZAAR) 50-12.5 MG tablet   rosuvastatin (CRESTOR) 10 MG tablet   Elevated hemoglobin A1c    Recently 5.9% in hospital, continue diet focus and recheck in 3-6 months.          Follow up plan: Return in about 6 weeks (around 06/28/2020) for HTN/HLD.

## 2020-05-17 NOTE — Patient Instructions (Signed)
     Syncope Syncope is when you pass out (faint) for a short time. It is caused by a sudden decrease in blood flow to the brain. Signs that you may be about to pass out include:  Feeling dizzy or light-headed.  Feeling sick to your stomach (nauseous).  Seeing all white or all black.  Having cold, clammy skin. If you pass out, get help right away. Call your local emergency services (911 in the [place] Do not drive yourself to the hospital. Follow these instructions at home: Watch for any changes in your symptoms. Take these actions to stay safe and help with your symptoms: Lifestyle  Do not drive, use machinery, or play sports until your doctor says it is okay.  Do not drink alcohol.  Do not use any products that contain nicotine or tobacco, such as cigarettes and e-cigarettes. If you need help quitting, ask your doctor.  Drink enough fluid to keep your pee (urine) pale yellow. General instructions  Take over-the-counter and prescription medicines only as told by your doctor.  If you are taking blood pressure or heart medicine, sit up and stand up slowly. Spend a few minutes getting ready to sit and then stand. This can help you feel less dizzy.  Have someone stay with you until you feel stable.  If you start to feel like you might pass out, lie down right away and raise (elevate) your feet above the level of your heart. Breathe deeply and steadily. Wait until all of the symptoms are gone.  Keep all follow-up visits as told by your doctor. This is important. Get help right away if:  You have a very bad headache.  You pass out once or more than once.  You have pain in your chest, belly, or back.  You have a very fast or uneven heartbeat (palpitations).  It hurts to breathe.  You are bleeding from your mouth or your bottom (rectum).  You have black or tarry poop (stool).  You have jerky movements that you cannot control (seizure).  You are confused.  You have  trouble walking.  You are very weak.  You have vision problems. These symptoms may be an emergency. Do not wait to see if the symptoms will go away. Get medical help right away. Call your local emergency services (911 in the [place] Do not drive yourself to the hospital. Summary  Syncope is when you pass out (faint) for a short time. It is caused by a sudden decrease in blood flow to the brain.  Signs that you may be about to faint include feeling dizzy, light-headed, or sick to your stomach, seeing all white or all black, or having cold, clammy skin.  If you start to feel like you might pass out, lie down right away and raise (elevate) your feet above the level of your heart. Breathe deeply and steadily. Wait until all of the symptoms are gone. This information is not intended to replace advice given to you by your health care provider. Make sure you discuss any questions you have with your health care provider. Document Revised: 01/19/2018 Document Reviewed: 01/19/2018 Elsevier Patient Education  2020 [employer]  

## 2020-05-17 NOTE — Assessment & Plan Note (Signed)
Acute and improved at this time.  Continue lower dose of BP medication as prescribed by cardiology and collaboration with them.  Recommend patient take medication as ordered daily and not to miss doses.  Could consider in future switch off Paxil and to alternate SSRI due to age >89.  Have discussed at length with patient and she would like to wait on this change.

## 2020-05-17 NOTE — Assessment & Plan Note (Signed)
History of in 2007, recent echo in hospital showing no return of this.  Continue collaboration with cardiology.

## 2020-05-17 NOTE — Assessment & Plan Note (Signed)
Chronic, stable with BP close to goal today, goal <130/80.  Continue current medication regimen and adjust as needed (Losartan-HCTZ).  Recommend continue to monitor BP daily at home.  Will check BMP next visit.  Recommend focus on DASH diet and modest weight loss.  Continue collaboration with cardiology, appreciate their input.

## 2020-05-27 ENCOUNTER — Telehealth: Payer: Self-pay | Admitting: Nurse Practitioner

## 2020-05-27 MED ORDER — PAROXETINE HCL 40 MG PO TABS: 20.0000 mg | ORAL_TABLET | Freq: Every day | ORAL | 4 refills | Status: DC

## 2020-05-27 NOTE — Telephone Encounter (Signed)
Medication: PARoxetine (PAXIL) 40 MG tablet [[id number]]   Has the patient contacted their pharmacy? Yes  (Agent: If no, request that the patient contact the pharmacy for the refill.) (Agent: If yes, when and what did the pharmacy advise?)  Preferred Pharmacy (with phone number or street name): [id number] - [place], [address]  Phone:  [phone] Fax:  [phone]     Agent: Please be advised that RX refills may take up to 3 business days. We ask that you follow-up with your pharmacy.

## 2020-05-28 NOTE — Telephone Encounter (Signed)
Pt stated that the pharmacy did not receive the prescription yesterday.

## 2020-05-28 NOTE — Telephone Encounter (Signed)
This should be okay, but I would have her ask the pharmacist as well when she goes to pick up new prescription.  If any side effects with this then alert me.

## 2020-05-28 NOTE — Telephone Encounter (Signed)
Patient notified

## 2020-05-28 NOTE — Telephone Encounter (Signed)
Pt requested an update on the Rx request. Pt stated she really needs the Rx to be approved or an approval for a few pills until she can get the Rx filled.

## 2020-05-28 NOTE — Telephone Encounter (Signed)
Called and spoke with patient, she is concerned that the manufacture is different for her paxil, she would like to know if there will be any issues with switching this. She states that she had a problem when it was switched in the past, thinks she had elevated heart rate with it, unsure because it was so long ago. Just wants to make sure that it is ok, because she is out and not able to get the original ones.

## 2020-06-19 ENCOUNTER — Other Ambulatory Visit: Payer: Medicare Other

## 2020-06-19 ENCOUNTER — Telehealth: Payer: Self-pay | Admitting: Nurse Practitioner

## 2020-06-19 ENCOUNTER — Other Ambulatory Visit: Payer: Self-pay

## 2020-06-19 DIAGNOSIS — I10 Essential (primary) hypertension: Secondary | ICD-10-CM

## 2020-06-19 DIAGNOSIS — E782 Mixed hyperlipidemia: Secondary | ICD-10-CM | POA: Diagnosis not present

## 2020-06-19 DIAGNOSIS — R7989 Other specified abnormal findings of blood chemistry: Secondary | ICD-10-CM

## 2020-06-19 DIAGNOSIS — J129 Viral pneumonia, unspecified: Secondary | ICD-10-CM | POA: Diagnosis not present

## 2020-06-19 DIAGNOSIS — R7303 Prediabetes: Secondary | ICD-10-CM

## 2020-06-19 LAB — LIPID PANEL PICCOLO, WAIVED
Chol/HDL Ratio Piccolo,Waive: 2.8 mg/dL
Cholesterol Piccolo, Waived: 148 mg/dL (ref <200)
HDL Chol Piccolo, Waived: 53 mg/dL — ABNORMAL LOW (ref >59)
LDL Chol Calc Piccolo Waived: 74 mg/dL (ref <100)
Triglycerides Piccolo,Waived: 107 mg/dL (ref <150)
VLDL Chol Calc Piccolo,Waive: 21 mg/dL (ref <30)

## 2020-06-19 LAB — MICROALBUMIN, URINE WAIVED
Creatinine, Urine Waived: 200 mg/dL (ref 10–300)
Microalb, Ur Waived: 30 mg/L — ABNORMAL HIGH (ref 0–19)
Microalb/Creat Ratio: <30 mg/g (ref <30)

## 2020-06-19 LAB — BAYER DCA HB A1C WAIVED: HB A1C (BAYER DCA - WAIVED): 6.2 % (ref <7.0)

## 2020-06-19 NOTE — Telephone Encounter (Signed)
Called and spoke to patient. She states that she viewed her results on her mychart and wanted to know what "waived" meant on the orders. I explained that this just basically lets [place] know that this is in an house lab, meaning it was ran her in the office. Patient also asked about the exclamation points beside the orders and I explained that this comes up when a lab value is out of the reference range for that particular lab. Patient was thankful for the call back. Let her know that Jolene was still waiting on a few results and would most likely review them in the morning. Patient verbalized understanding.

## 2020-06-19 NOTE — Telephone Encounter (Signed)
Copied from [place] [phone]. Topic: General - Other >> Jun 19, 2020  4:12 PM [name] wrote: Reason for CRM: Pt called stating that she has questions about her blood work and is requesting to have someone to give her a call back. Please advise.

## 2020-06-20 LAB — COMPREHENSIVE METABOLIC PANEL
ALT: 13 IU/L (ref 0–32)
AST: 17 IU/L (ref 0–40)
Albumin/Globulin Ratio: 1.4 (ref 1.2–2.2)
Albumin: 4.1 g/dL (ref 3.8–4.8)
Alkaline Phosphatase: 132 IU/L — ABNORMAL HIGH (ref 48–121)
BUN/Creatinine Ratio: 15 (ref 12–28)
BUN: 18 mg/dL (ref 8–27)
Bilirubin Total: 0.4 mg/dL (ref 0.0–1.2)
CO2: 22 mmol/L (ref 20–29)
Calcium: 9.8 mg/dL (ref 8.7–10.3)
Chloride: 103 mmol/L (ref 96–106)
Creatinine, Ser: 1.24 mg/dL — ABNORMAL HIGH (ref 0.57–1.00)
GFR calc Af Amer: 51 mL/min/{1.73_m2} — ABNORMAL LOW (ref >59)
GFR calc non Af Amer: 44 mL/min/{1.73_m2} — ABNORMAL LOW (ref >59)
Globulin, Total: 2.9 g/dL (ref 1.5–4.5)
Glucose: 119 mg/dL — ABNORMAL HIGH (ref 65–99)
Potassium: 4.6 mmol/L (ref 3.5–5.2)
Sodium: 140 mmol/L (ref 134–144)
Total Protein: 7 g/dL (ref 6.0–8.5)

## 2020-06-20 LAB — THYROID PANEL WITH TSH
Free Thyroxine Index: 1.4 (ref 1.2–4.9)
T3 Uptake Ratio: 21 % — ABNORMAL LOW (ref 24–39)
T4, Total: 6.5 ug/dL (ref 4.5–12.0)
TSH: 4.43 u[IU]/mL (ref 0.450–4.500)

## 2020-06-20 LAB — CBC WITH DIFFERENTIAL/PLATELET
Basophils Absolute: 0.1 10*3/uL (ref 0.0–0.2)
Basos: 1 %
EOS (ABSOLUTE): 0.4 10*3/uL (ref 0.0–0.4)
Eos: 5 %
Hematocrit: 37.5 % (ref 34.0–46.6)
Hemoglobin: 12.1 g/dL (ref 11.1–15.9)
Immature Grans (Abs): 0.1 10*3/uL (ref 0.0–0.1)
Immature Granulocytes: 1 %
Lymphocytes Absolute: 2.1 10*3/uL (ref 0.7–3.1)
Lymphs: 25 %
MCH: 27.9 pg (ref 26.6–33.0)
MCHC: 32.3 g/dL (ref 31.5–35.7)
MCV: 86 fL (ref 79–97)
Monocytes Absolute: 0.6 10*3/uL (ref 0.1–0.9)
Monocytes: 7 %
Neutrophils Absolute: 5.2 10*3/uL (ref 1.4–7.0)
Neutrophils: 61 %
Platelets: 227 10*3/uL (ref 150–450)
RBC: 4.34 x10E6/uL (ref 3.77–5.28)
RDW: 13.5 % (ref 11.7–15.4)
WBC: 8.5 10*3/uL (ref 3.4–10.8)

## 2020-06-20 NOTE — Progress Notes (Signed)
Contacted via [place] morning [place], your labs have returned: - CBC shows no anemia - Kidney function is showing some mild kidney disease with GFR 44 and creatinine 1.24 -- please ensure you are drinking lots of water at home daily, I may adjust your blood pressure medications next visit.  Liver function is normal. - Thyroid level, TSH, had returned to a normal range.  We will continue to monitor this. - Cholesterol levels look good. - A1C, diabetes testing, is creeping up at 6.2% now.  Was 5.9% last visit.  Anything 5.7-6.4% is prediabetes and 6.5% or greater is diabetes.  Heavily focus on diet at home, decreasing foods high in sugar and carbohydrates. Any questions? Keep being awesome!! Kindest regards, Cypress Hinkson

## 2020-06-21 ENCOUNTER — Ambulatory Visit (INDEPENDENT_AMBULATORY_CARE_PROVIDER_SITE_OTHER): Payer: Medicare Other

## 2020-06-21 VITALS — Ht 61.0 in | Wt 179.0 lb

## 2020-06-21 DIAGNOSIS — Z0000 Encounter for general adult medical examination without abnormal findings: Secondary | ICD-10-CM | POA: Diagnosis not present

## 2020-06-21 NOTE — Progress Notes (Signed)
I connected with Lisa Bass today by telephone and verified that I am speaking with the correct person using two identifiers. Location patient: home Location provider: work Persons participating in the virtual visit: Makenze, Ellett LPN.   I discussed the limitations, risks, security and privacy concerns of performing an evaluation and management service by telephone and the availability of in person appointments. I also discussed with the patient that there may be a patient responsible charge related to this service. The patient expressed understanding and verbally consented to this telephonic visit.    Interactive audio and video telecommunications were attempted between this provider and patient, however failed, due to patient having technical difficulties OR patient did not have access to video capability.  We continued and completed visit with audio only.  Vital signs may be patient reported or missing.     Subjective:   Lisa Bass is a 69 y.o. female who presents for Medicare Annual (Subsequent) preventive examination.  Review of Systems     Cardiac Risk Factors include: advanced age (>65men, >53 women);dyslipidemia;hypertension;sedentary lifestyle     Objective:    Today's Vitals   06/21/20 0950  Weight: 179 lb (81.2 kg)  Height: 5\' 1"  (1.549 m)   Body mass index is 33.82 kg/m.  Advanced Directives 06/21/2020 05/14/2020 05/11/2020 04/28/2020 02/13/2019 02/11/2018 09/07/2017  Does Patient Have a Medical Advance Directive? No No No No No No No  Type of Advance Directive - - - - - - -  Would patient like information on creating a medical advance directive? No - Patient declined No - Patient declined No - Patient declined - Yes (MAU/Ambulatory/Procedural Areas - Information given) Yes (MAU/Ambulatory/Procedural Areas - Information given) -    Current Medications (verified) Outpatient Encounter Medications as of 06/21/2020  Medication Sig  . amLODipine (NORVASC) 5  MG tablet Take 1 tablet (5 mg total) by mouth daily.  Marland Kitchen aspirin 81 MG tablet Take 81 mg by mouth daily.  . cholecalciferol (VITAMIN D3) 25 MCG (1000 UT) tablet Take 1,000 Units by mouth daily.  Marland Kitchen losartan-hydrochlorothiazide (HYZAAR) 50-12.5 MG tablet Take 1 tablet by mouth daily.  . Multiple Vitamin (MULTIVITAMIN) tablet Take 1 tablet by mouth daily.  Marland Kitchen PARoxetine (PAXIL) 40 MG tablet Take 0.5 tablets (20 mg total) by mouth daily. Take 1/2 dose  . rosuvastatin (CRESTOR) 10 MG tablet Take 1 tablet (10 MG) by mouth on Monday, Wednesday, and Friday.  . vitamin B-12 (CYANOCOBALAMIN) 1000 MCG tablet Take 1,000 mcg by mouth daily.   No facility-administered encounter medications on file as of 06/21/2020.    Allergies (verified) Penicillins, Biaxin [clarithromycin], and Lisinopril   History: Past Medical History:  Diagnosis Date  . Allergy   . Asthma   . Cataract   . CHF (congestive heart failure) ([place])   . Depression   . Hyperlipidemia   . Hypertension   . OCD (obsessive compulsive disorder)   . Palpitations   . Perimenopause    Past Surgical History:  Procedure Laterality Date  . CHOLECYSTECTOMY    . COLONOSCOPY WITH PROPOFOL N/A 09/07/2017   Procedure: COLONOSCOPY WITH PROPOFOL;  Surgeon: [name], MD;  Location: [facility] ENDOSCOPY;  Service: Endoscopy;  Laterality: N/A;  . open heart surgery    . ROOT CANAL     x2   Family History  Problem Relation Age of Onset  . Hypertension Mother   . Dementia Father   . Hypertension Father   . Diabetes Father   . Depression Father   .  GI problems Daughter   . Heart disease Maternal Grandfather   . Depression Sister    Social History   Socioeconomic History  . Marital status: Married    Spouse name: Not on file  . Number of children: Not on file  . Years of education: Not on file  . Highest education level: High school graduate  Occupational History  . Occupation: retired   Tobacco Use  . Smoking status: Never Smoker  .  Smokeless tobacco: Never Used  Vaping Use  . Vaping Use: Never used  Substance and Sexual Activity  . Alcohol use: No    Alcohol/week: 0.0 standard drinks  . Drug use: No  . Sexual activity: Yes  Other Topics Concern  . Not on file  Social History Narrative   Very active in church    Social Determinants of Health   Financial Resource Strain: Low Risk   . Difficulty of Paying Living Expenses: Not hard at all  Food Insecurity: No Food Insecurity  . Worried About Charity fundraiser in the Last Year: Never true  . Ran Out of Food in the Last Year: Never true  Transportation Needs: No Transportation Needs  . Lack of Transportation (Medical): No  . Lack of Transportation (Non-Medical): No  Physical Activity: Inactive  . Days of Exercise per Week: 0 days  . Minutes of Exercise per Session: 0 min  Stress: No Stress Concern Present  . Feeling of Stress : Not at all  Social Connections:   . Frequency of Communication with Friends and Family:   . Frequency of Social Gatherings with Friends and Family:   . Attends Religious Services:   . Active Member of Clubs or Organizations:   . Attends Archivist Meetings:   Marland Kitchen Marital Status:     Tobacco Counseling Counseling given: Not Answered   Clinical Intake:  Pre-visit preparation completed: Yes  Pain : No/denies pain     Nutritional Status: BMI > 30  Obese Nutritional Risks: None Diabetes: No  How often do you need to have someone help you when you read instructions, pamphlets, or other written materials from your doctor or pharmacy?: 1 - Never What is the last grade level you completed in school?: 12th grade  Diabetic? no  Interpreter Needed?: No  Information entered by :: NAllen LPN   Activities of Daily Living In your present state of health, do you have any difficulty performing the following activities: 06/21/2020 05/15/2020  Hearing? N N  Vision? Y N  Comment trouble seeing at night -  Difficulty  concentrating or making decisions? N N  Walking or climbing stairs? N N  Dressing or bathing? N N  Doing errands, shopping? N Y  Conservation officer, nature and eating ? N -  Using the Toilet? N -  In the past six months, have you accidently leaked urine? N -  Do you have problems with loss of bowel control? N -  Managing your Medications? N -  Managing your Finances? N -  Housekeeping or managing your Housekeeping? N -  Some recent data might be hidden    Patient Care Team: Venita Lick, NP as PCP - General (Nurse Practitioner) [name], NP as PCP - Family Medicine (Nurse Practitioner) Margaretha Sheffield, MD (Otolaryngology) Yolonda Kida, MD as Consulting Physician (Cardiology) De Hollingshead, [facility] as Pharmacist (Pharmacist)  Indicate any recent Medical Services you may have received from other than [facility] providers in the past year (date may be  approximate).     Assessment:   This is a routine wellness examination for Lisa Bass.  Hearing/Vision screen  Hearing Screening   125Hz  250Hz  500Hz  1000Hz  2000Hz  3000Hz  4000Hz  6000Hz  8000Hz   Right ear:           Left ear:           Vision Screening Comments: Regular eye exams. [facility] in [place]  Dietary issues and exercise activities discussed: Current Exercise Habits: The patient does not participate in regular exercise at present  Goals    .  "I have a lot of medications" (pt-stated)      Current Barriers:  . Polypharmacy, and patient confusion about her medication regimen, purpose of each agent, monitoring parameters . At last office visit with [facility], atorvastatin stopped and switched to rosuvastatin; patient had started having diarrhea since starting atorvastatin.  . Patient confused today about which statin medication she is supposed to be on. . Today, she notes diarrhea has stopped; denies any tolerability concerns with rosuvastatin TIW o HTN: losartan/HCTZ 100-25 mg, amlodipine 5 mg; continues to check regularly;  takes both medications midday o Depression: paroxetine 20 mg QAM o CV Risk Reduction: rosuvastatin 20 mg TIW, aspirin 81 mg QPM o Osteopenia: Vitamin D3, multivitamin o Low B12 - Vitamin B12 1000 units daily   Pharmacist Clinical Goal(s):  Marland Kitchen Over the next 90 days, patient will work with PharmD and primary care provider to address needs related to optimized medication management  Interventions: . Congratulated patient on continued commitment to medication adherence.  . After medication review, patient noted she was going to throw away the atorvastatin so that she didn't get confused. Agreed with this . Encouraged to continue to check BP regularly and document.  . Patient due for f/u with Jolene Cannady s/p rosuvastatin start and BP med adjustment. Will collaborate with Jolene to see if she wants virtual vs physical appointment (though likely will want to check lipid panel), and collaborate with office staff for outreaching the patient to schedule   Patient Self Care Activities:  . Self administers medications as prescribed . Calls provider office for new concerns or questions  Please see past updates related to this goal by clicking on the "Past Updates" button in the selected goal      .  DIET - INCREASE WATER INTAKE      Recommend drinking at least 6-8 glasses of water a day     .  Patient Stated      06/21/2020, wants to cut back on sugar and carbs      Depression Screen PHQ 2/9 Scores 06/21/2020 05/08/2020 02/22/2019 02/13/2019 02/11/2018 01/14/2018 10/19/2017  PHQ - 2 Score 0 1 0 0 0 1 1  PHQ- 9 Score 0 1 4 - 0 2 4    Fall Risk Fall Risk  06/21/2020 03/23/2019 02/13/2019 02/11/2018 01/14/2018  Falls in the past year? 1 0 0 No No  Comment passes out - - - -  Number falls in past yr: 1 - 0 - -  Injury with Fall? 1 - - - -  Risk for fall due to : Medication side effect;History of fall(s) - - - -  Follow up - Falls evaluation completed - - -    Any stairs in or around the home? No  If  so, are there any without handrails? n/a Home free of loose throw rugs in walkways, pet beds, electrical cords, etc? Yes  Adequate lighting in your home to reduce risk of  falls? Yes   ASSISTIVE DEVICES UTILIZED TO PREVENT FALLS:  Life alert? No  Use of a cane, walker or w/c? No  Grab bars in the bathroom? Yes  Shower chair or bench in shower? No  Elevated toilet seat or a handicapped toilet? No   TIMED UP AND GO:  Was the test performed? No .     Cognitive Function:     6CIT Screen 06/21/2020 02/13/2019 02/11/2018  What Year? 0 points 0 points 0 points  What month? 0 points 0 points 0 points  What time? 0 points 0 points 0 points  Count back from 20 0 points 0 points 0 points  Months in reverse 0 points 0 points 0 points  Repeat phrase 0 points 0 points 0 points  Total Score 0 0 0    Immunizations Immunization History  Administered Date(s) Administered  . Pneumococcal Conjugate-13 09/23/2015  . Pneumococcal Polysaccharide-23 12/22/2016  . Td 03/11/1999    TDAP status: Due, Education has been provided regarding the importance of this vaccine. Advised may receive this vaccine at local pharmacy or Health Dept. Aware to provide a copy of the vaccination record if obtained from local pharmacy or Health Dept. Verbalized acceptance and understanding. Flu Vaccine status: Declined, Education has been provided regarding the importance of this vaccine but patient still declined. Advised may receive this vaccine at local pharmacy or Health Dept. Aware to provide a copy of the vaccination record if obtained from local pharmacy or Health Dept. Verbalized acceptance and understanding. Pneumococcal vaccine status: Up to date Covid-19 vaccine status: Declined, Education has been provided regarding the importance of this vaccine but patient still declined. Advised may receive this vaccine at local pharmacy or Health Dept.or vaccine clinic. Aware to provide a copy of the vaccination record if  obtained from local pharmacy or Health Dept. Verbalized acceptance and understanding.  Qualifies for Shingles Vaccine? Yes   Zostavax completed No   Shingrix Completed?: No.    Education has been provided regarding the importance of this vaccine. Patient has been advised to call insurance company to determine out of pocket expense if they have not yet received this vaccine. Advised may also receive vaccine at local pharmacy or Health Dept. Verbalized acceptance and understanding.  Screening Tests Health Maintenance  Topic Date Due  . COVID-19 Vaccine (1) Never done  . MAMMOGRAM  08/21/2007  . TETANUS/TDAP  05/08/2021 (Originally 03/10/2009)  . INFLUENZA VACCINE  07/21/2020  . COLONOSCOPY  09/08/2027  . DEXA SCAN  Completed  . Hepatitis C Screening  Completed  . PNA vac Low Risk Adult  Completed    Health Maintenance  Health Maintenance Due  Topic Date Due  . COVID-19 Vaccine (1) Never done  . MAMMOGRAM  08/21/2007    Colorectal cancer screening: Completed 09/07/2017. Repeat every 10 years Mammogram status: Due Bone Density status: Completed 05/30/2019.  Lung Cancer Screening: (Low Dose CT Chest recommended if Age 17-80 years, 30 pack-year currently smoking OR have quit w/in 15years.) does not qualify.   Lung Cancer Screening Referral: no  Additional Screening:  Hepatitis C Screening: does qualify; Completed 09/23/2015  Vision Screening: Recommended annual ophthalmology exams for early detection of glaucoma and other disorders of the eye. Is the patient up to date with their annual eye exam?  No  Who is the provider or what is the name of the office in which the patient attends annual eye exams? [facility] in [place] If pt is not established with a provider, would they like  to be referred to a provider to establish care? No .   Dental Screening: Recommended annual dental exams for proper oral hygiene  Community Resource Referral / Chronic Care Management: CRR required this visit?   No   CCM required this visit?  No      Plan:     I have personally reviewed and noted the following in the patient's chart:   . Medical and social history . Use of alcohol, tobacco or illicit drugs  . Current medications and supplements . Functional ability and status . Nutritional status . Physical activity . Advanced directives . List of other physicians . Hospitalizations, surgeries, and ER visits in previous 12 months . Vitals . Screenings to include cognitive, depression, and falls . Referrals and appointments  In addition, I have reviewed and discussed with patient certain preventive protocols, quality metrics, and best practice recommendations. A written personalized care plan for preventive services as well as general preventive health recommendations were provided to patient.    Due to this being a telephonic visit, the after visit summary with patients personalized plan was offered to patient via mail or my-chart. Kellie Simmering, LPN   05/28/5915   Nurse Notes: Patient is having trouble seeing at night. Has not gotten covid vaccine. She is thinking about it.Patient is due for a mammogram.

## 2020-06-21 NOTE — Patient Instructions (Signed)
Lisa Bass , Thank you for taking time to come for your Medicare Wellness Visit. I appreciate your ongoing commitment to your health goals. Please review the following plan we discussed and let me know if I can assist you in the future.   Screening recommendations/referrals: Colonoscopy: completed 09/07/2017, due 09/08/2027 Mammogram: due Bone Density: completed 05/30/2019 Recommended yearly ophthalmology/optometry visit for glaucoma screening and checkup Recommended yearly dental visit for hygiene and checkup  Vaccinations: Influenza vaccine: decline Pneumococcal vaccine: completed 09/23/2015 Tdap vaccine: declined Shingles vaccine: discussed   Covid-19:declined at this time  Advanced directives: Advance directive discussed with you today. Even though you declined this today please call our office should you change your mind and we can give you the proper paperwork for you to fill out.  Conditions/risks identified: none  Next appointment: Follow up in one year for your annual wellness visit 06/26/2021 at 9:45   Preventive Care 65 Years and Older, Female Preventive care refers to lifestyle choices and visits with your health care provider that can promote health and wellness. What does preventive care include?  A yearly physical exam. This is also called an annual well check.  Dental exams once or twice a year.  Routine eye exams. Ask your health care provider how often you should have your eyes checked.  Personal lifestyle choices, including:  Daily care of your teeth and gums.  Regular physical activity.  Eating a healthy diet.  Avoiding tobacco and drug use.  Limiting alcohol use.  Practicing safe sex.  Taking low-dose aspirin every day.  Taking vitamin and mineral supplements as recommended by your health care provider. What happens during an annual well check? The services and screenings done by your health care provider during your annual well check will depend on  your age, overall health, lifestyle risk factors, and family history of disease. Counseling  Your health care provider may ask you questions about your:  Alcohol use.  Tobacco use.  Drug use.  Emotional well-being.  Home and relationship well-being.  Sexual activity.  Eating habits.  History of falls.  Memory and ability to understand (cognition).  Work and work Statistician.  Reproductive health. Screening  You may have the following tests or measurements:  Height, weight, and BMI.  Blood pressure.  Lipid and cholesterol levels. These may be checked every 5 years, or more frequently if you are over 20 years old.  Skin check.  Lung cancer screening. You may have this screening every year starting at age 13 if you have a 30-pack-year history of smoking and currently smoke or have quit within the past 15 years.  Fecal occult blood test (FOBT) of the stool. You may have this test every year starting at age 19.  Flexible sigmoidoscopy or colonoscopy. You may have a sigmoidoscopy every 5 years or a colonoscopy every 10 years starting at age 70.  Hepatitis C blood test.  Hepatitis B blood test.  Sexually transmitted disease (STD) testing.  Diabetes screening. This is done by checking your blood sugar (glucose) after you have not eaten for a while (fasting). You may have this done every 1-3 years.  Bone density scan. This is done to screen for osteoporosis. You may have this done starting at age 31.  Mammogram. This may be done every 1-2 years. Talk to your health care provider about how often you should have regular mammograms. Talk with your health care provider about your test results, treatment options, and if necessary, the need for more tests. Vaccines  Your health care provider may recommend certain vaccines, such as:  Influenza vaccine. This is recommended every year.  Tetanus, diphtheria, and acellular pertussis (Tdap, Td) vaccine. You may need a Td booster  every 10 years.  Zoster vaccine. You may need this after age 31.  Pneumococcal 13-valent conjugate (PCV13) vaccine. One dose is recommended after age 47.  Pneumococcal polysaccharide (PPSV23) vaccine. One dose is recommended after age 60. Talk to your health care provider about which screenings and vaccines you need and how often you need them. This information is not intended to replace advice given to you by your health care provider. Make sure you discuss any questions you have with your health care provider. Document Released: 01/03/2016 Document Revised: 08/26/2016 Document Reviewed: 10/08/2015 Elsevier Interactive Patient Education  2017 [place] Prevention in the Home Falls can cause injuries. They can happen to people of all ages. There are many things you can do to make your home safe and to help prevent falls. What can I do on the outside of my home?  Regularly fix the edges of walkways and driveways and fix any cracks.  Remove anything that might make you trip as you walk through a door, such as a raised step or threshold.  Trim any bushes or trees on the path to your home.  Use bright outdoor lighting.  Clear any walking paths of anything that might make someone trip, such as rocks or tools.  Regularly check to see if handrails are loose or broken. Make sure that both sides of any steps have handrails.  Any raised decks and porches should have guardrails on the edges.  Have any leaves, snow, or ice cleared regularly.  Use sand or salt on walking paths during winter.  Clean up any spills in your garage right away. This includes oil or grease spills. What can I do in the bathroom?  Use night lights.  Install grab bars by the toilet and in the tub and shower. Do not use towel bars as grab bars.  Use non-skid mats or decals in the tub or shower.  If you need to sit down in the shower, use a plastic, non-slip stool.  Keep the floor dry. Clean up any  water that spills on the floor as soon as it happens.  Remove soap buildup in the tub or shower regularly.  Attach bath mats securely with double-sided non-slip rug tape.  Do not have throw rugs and other things on the floor that can make you trip. What can I do in the bedroom?  Use night lights.  Make sure that you have a light by your bed that is easy to reach.  Do not use any sheets or blankets that are too big for your bed. They should not hang down onto the floor.  Have a firm chair that has side arms. You can use this for support while you get dressed.  Do not have throw rugs and other things on the floor that can make you trip. What can I do in the kitchen?  Clean up any spills right away.  Avoid walking on wet floors.  Keep items that you use a lot in easy-to-reach places.  If you need to reach something above you, use a strong step stool that has a grab bar.  Keep electrical cords out of the way.  Do not use floor polish or wax that makes floors slippery. If you must use wax, use non-skid floor wax.  Do  not have throw rugs and other things on the floor that can make you trip. What can I do with my stairs?  Do not leave any items on the stairs.  Make sure that there are handrails on both sides of the stairs and use them. Fix handrails that are broken or loose. Make sure that handrails are as long as the stairways.  Check any carpeting to make sure that it is firmly attached to the stairs. Fix any carpet that is loose or worn.  Avoid having throw rugs at the top or bottom of the stairs. If you do have throw rugs, attach them to the floor with carpet tape.  Make sure that you have a light switch at the top of the stairs and the bottom of the stairs. If you do not have them, ask someone to add them for you. What else can I do to help prevent falls?  Wear shoes that:  Do not have high heels.  Have rubber bottoms.  Are comfortable and fit you well.  Are closed  at the toe. Do not wear sandals.  If you use a stepladder:  Make sure that it is fully opened. Do not climb a closed stepladder.  Make sure that both sides of the stepladder are locked into place.  Ask someone to hold it for you, if possible.  Clearly mark and make sure that you can see:  Any grab bars or handrails.  First and last steps.  Where the edge of each step is.  Use tools that help you move around (mobility aids) if they are needed. These include:  Canes.  Walkers.  Scooters.  Crutches.  Turn on the lights when you go into a dark area. Replace any light bulbs as soon as they burn out.  Set up your furniture so you have a clear path. Avoid moving your furniture around.  If any of your floors are uneven, fix them.  If there are any pets around you, be aware of where they are.  Review your medicines with your doctor. Some medicines can make you feel dizzy. This can increase your chance of falling. Ask your doctor what other things that you can do to help prevent falls. This information is not intended to replace advice given to you by your health care provider. Make sure you discuss any questions you have with your health care provider. Document Released: 10/03/2009 Document Revised: 05/14/2016 Document Reviewed: 01/11/2015 Elsevier Interactive Patient Education  2017 [employer].

## 2020-06-28 ENCOUNTER — Encounter: Payer: Self-pay | Admitting: Nurse Practitioner

## 2020-06-28 ENCOUNTER — Other Ambulatory Visit: Payer: Self-pay

## 2020-06-28 ENCOUNTER — Ambulatory Visit (INDEPENDENT_AMBULATORY_CARE_PROVIDER_SITE_OTHER): Payer: Medicare Other | Admitting: Nurse Practitioner

## 2020-06-28 VITALS — BP 99/63 | HR 77 | Temp 98.7°F | Wt 179.0 lb

## 2020-06-28 DIAGNOSIS — I10 Essential (primary) hypertension: Secondary | ICD-10-CM | POA: Diagnosis not present

## 2020-06-28 DIAGNOSIS — E782 Mixed hyperlipidemia: Secondary | ICD-10-CM | POA: Diagnosis not present

## 2020-06-28 MED ORDER — LOSARTAN POTASSIUM 50 MG PO TABS: 50.0000 mg | ORAL_TABLET | Freq: Every day | ORAL | 4 refills | Status: DC

## 2020-06-28 NOTE — Assessment & Plan Note (Signed)
Chronic, ongoing.  Continue Crestor 10 MG Monday/Wednesday/Friday schedule.  Obtain lipid panel outpatient next week with her fasting.  Plan for follow-up in 8 weeks.

## 2020-06-28 NOTE — Patient Instructions (Signed)
We will discontinue Amlodipine and Hydrochlorothiazide for now.  Start taking Losartan only 50 MG daily and alert me if your blood pressure consistently are above 130/80 at home.     DASH Eating Plan DASH stands for "Dietary Approaches to Stop Hypertension." The DASH eating plan is a healthy eating plan that has been shown to reduce high blood pressure (hypertension). It may also reduce your risk for type 2 diabetes, heart disease, and stroke. The DASH eating plan may also help with weight loss. What are tips for following this plan?  General guidelines  Avoid eating more than 2,300 mg (milligrams) of salt (sodium) a day. If you have hypertension, you may need to reduce your sodium intake to 1,500 mg a day.  Limit alcohol intake to no more than 1 drink a day for nonpregnant women and 2 drinks a day for men. One drink equals 12 oz of beer, 5 oz of wine, or 1 oz of hard liquor.  Work with your health care provider to maintain a healthy body weight or to lose weight. Ask what an ideal weight is for you.  Get at least 30 minutes of exercise that causes your heart to beat faster (aerobic exercise) most days of the week. Activities may include walking, swimming, or biking.  Work with your health care provider or diet and nutrition specialist (dietitian) to adjust your eating plan to your individual calorie needs. Reading food labels   Check food labels for the amount of sodium per serving. Choose foods with less than 5 percent of the Daily Value of sodium. Generally, foods with less than 300 mg of sodium per serving fit into this eating plan.  To find whole grains, look for the word "whole" as the first word in the ingredient list. Shopping  Buy products labeled as "low-sodium" or "no salt added."  Buy fresh foods. Avoid canned foods and premade or frozen meals. Cooking  Avoid adding salt when cooking. Use salt-free seasonings or herbs instead of table salt or sea salt. Check with your  health care provider or pharmacist before using salt substitutes.  Do not fry foods. Cook foods using healthy methods such as baking, boiling, grilling, and broiling instead.  Cook with heart-healthy oils, such as olive, canola, soybean, or sunflower oil. Meal planning  Eat a balanced diet that includes: ? 5 or more servings of fruits and vegetables each day. At each meal, try to fill half of your plate with fruits and vegetables. ? Up to 6-8 servings of whole grains each day. ? Less than 6 oz of lean meat, poultry, or fish each day. A 3-oz serving of meat is about the same size as a deck of cards. One egg equals 1 oz. ? 2 servings of low-fat dairy each day. ? A serving of nuts, seeds, or beans 5 times each week. ? Heart-healthy fats. Healthy fats called Omega-3 fatty acids are found in foods such as flaxseeds and coldwater fish, like sardines, salmon, and mackerel.  Limit how much you eat of the following: ? Canned or prepackaged foods. ? Food that is high in trans fat, such as fried foods. ? Food that is high in saturated fat, such as fatty meat. ? Sweets, desserts, sugary drinks, and other foods with added sugar. ? Full-fat dairy products.  Do not salt foods before eating.  Try to eat at least 2 vegetarian meals each week.  Eat more home-cooked food and less restaurant, buffet, and fast food.  When eating at a  restaurant, ask that your food be prepared with less salt or no salt, if possible. What foods are recommended? The items listed may not be a complete list. Talk with your dietitian about what dietary choices are best for you. Grains Whole-grain or whole-wheat bread. Whole-grain or whole-wheat pasta. Brown rice. [place]. Bulgur. Whole-grain and low-sodium cereals. Pita bread. Low-fat, low-sodium crackers. Whole-wheat flour tortillas. Vegetables Fresh or frozen vegetables (raw, steamed, roasted, or grilled). Low-sodium or reduced-sodium tomato and vegetable juice.  Low-sodium or reduced-sodium tomato sauce and tomato paste. Low-sodium or reduced-sodium canned vegetables. Fruits All fresh, dried, or frozen fruit. Canned fruit in natural juice (without added sugar). Meat and other protein foods Skinless chicken or [place]. Ground chicken or [place]. Pork with fat trimmed off. Fish and seafood. Egg whites. Dried beans, peas, or lentils. Unsalted nuts, nut butters, and seeds. Unsalted canned beans. Lean cuts of beef with fat trimmed off. Low-sodium, lean deli meat. Dairy Low-fat (1%) or fat-free (skim) milk. Fat-free, low-fat, or reduced-fat cheeses. Nonfat, low-sodium ricotta or cottage cheese. Low-fat or nonfat yogurt. Low-fat, low-sodium cheese. Fats and oils Soft margarine without trans fats. Vegetable oil. Low-fat, reduced-fat, or light mayonnaise and salad dressings (reduced-sodium). Canola, safflower, olive, soybean, and sunflower oils. Avocado. Seasoning and other foods Herbs. Spices. Seasoning mixes without salt. Unsalted popcorn and pretzels. Fat-free sweets. What foods are not recommended? The items listed may not be a complete list. Talk with your dietitian about what dietary choices are best for you. Grains Baked goods made with fat, such as croissants, muffins, or some breads. Dry pasta or rice meal packs. Vegetables Creamed or fried vegetables. Vegetables in a cheese sauce. Regular canned vegetables (not low-sodium or reduced-sodium). Regular canned tomato sauce and paste (not low-sodium or reduced-sodium). Regular tomato and vegetable juice (not low-sodium or reduced-sodium). Angie Fava. Olives. Fruits Canned fruit in a light or heavy syrup. Fried fruit. Fruit in cream or butter sauce. Meat and other protein foods Fatty cuts of meat. Ribs. Fried meat. Berniece Salines. Sausage. Bologna and other processed lunch meats. Salami. Fatback. Hotdogs. Bratwurst. Salted nuts and seeds. Canned beans with added salt. Canned or smoked fish. Whole eggs or egg yolks. Chicken  or [place] with skin. Dairy Whole or 2% milk, cream, and half-and-half. Whole or full-fat cream cheese. Whole-fat or sweetened yogurt. Full-fat cheese. Nondairy creamers. Whipped toppings. Processed cheese and cheese spreads. Fats and oils Butter. Stick margarine. Lard. Shortening. Ghee. Bacon fat. Tropical oils, such as coconut, palm kernel, or palm oil. Seasoning and other foods Salted popcorn and pretzels. Onion salt, garlic salt, seasoned salt, table salt, and sea salt. Worcestershire sauce. Tartar sauce. Barbecue sauce. Teriyaki sauce. Soy sauce, including reduced-sodium. Steak sauce. Canned and packaged gravies. Fish sauce. Oyster sauce. Cocktail sauce. Horseradish that you find on the shelf. Ketchup. Mustard. Meat flavorings and tenderizers. Bouillon cubes. Hot sauce and Tabasco sauce. Premade or packaged marinades. Premade or packaged taco seasonings. Relishes. Regular salad dressings. Where to find more information:  National Heart, Lung, and [place]: https://wilson-eaton.com/  American Heart Association: www.heart.org Summary  The DASH eating plan is a healthy eating plan that has been shown to reduce high blood pressure (hypertension). It may also reduce your risk for type 2 diabetes, heart disease, and stroke.  With the DASH eating plan, you should limit salt (sodium) intake to 2,300 mg a day. If you have hypertension, you may need to reduce your sodium intake to 1,500 mg a day.  When on the DASH eating plan, aim to eat more  fresh fruits and vegetables, whole grains, lean proteins, low-fat dairy, and heart-healthy fats.  Work with your health care provider or diet and nutrition specialist (dietitian) to adjust your eating plan to your individual calorie needs. This information is not intended to replace advice given to you by your health care provider. Make sure you discuss any questions you have with your health care provider. Document Revised: 11/19/2017 Document Reviewed:  11/30/2016 Elsevier Patient Education  2020 [employer].

## 2020-06-28 NOTE — Assessment & Plan Note (Signed)
Chronic, stable with BP on lower side at home and in office -- concern for syncope with this.  Will discontinue Amlodipine and HCTZ at this time and trial only Losartan 50 MG, she denies any edema at baseline -- could increase Losartan if BP elevations present.  Recommend continue to monitor BP daily at home and notify provider if consistent above 130/80.  Will check BMP oupatient.  Recommend focus on DASH diet and modest weight loss.  Continue collaboration with cardiology, appreciate their input.  Return in 8 weeks.

## 2020-06-28 NOTE — Progress Notes (Signed)
BP 99/63    Pulse 77    Temp 98.7 F (37.1 C) (Oral)    Wt 179 lb (81.2 kg)    LMP  (LMP Unknown)    SpO2 98%    BMI 33.82 kg/m    Subjective:    Patient ID: Lisa Bass, female    DOB: 1951-09-05, 69 y.o.   MRN: [id number]  HPI: Lisa Bass is a 69 y.o. female  Chief Complaint  Patient presents with   Hyperlipidemia   Hypertension   HYPERTENSION / HYPERLIPIDEMIA Currently taking Crestor 10 MG M/W/F schedule.  Her BP medications were reduced recently by cardiology due to syncope, currently taking Losartan-HCTZ 50-12.5 MG + Amlodipine 5 MG daily and ASA.  Does have underlying B12 deficiency with last level one year ago == 290. Taking B12 1000 MCG daily.  Satisfied with current treatment? yes Duration of hypertension: chronic BP monitoring frequency: daily BP range: 113-115/ 70's  BP medication side effects: no Duration of hyperlipidemia: chronic Cholesterol medication side effects: no Cholesterol supplements: none Medication compliance: good compliance Aspirin: yes Recent stressors: no Recurrent headaches: no Visual changes: no Palpitations: no Dyspnea: no Chest pain: no Lower extremity edema: no Dizzy/lightheaded: no  Relevant past medical, surgical, family and social history reviewed and updated as indicated. Interim medical history since our last visit reviewed. Allergies and medications reviewed and updated.  Review of Systems  Constitutional: Negative for activity change, appetite change, diaphoresis, fatigue and fever.  Respiratory: Negative for cough, chest tightness and shortness of breath.   Cardiovascular: Negative for chest pain, palpitations and leg swelling.  Gastrointestinal: Negative.   Neurological: Negative.   Psychiatric/Behavioral: Negative.     Per HPI unless specifically indicated above     Objective:    BP 99/63    Pulse 77    Temp 98.7 F (37.1 C) (Oral)    Wt 179 lb (81.2 kg)    LMP  (LMP Unknown)    SpO2 98%    BMI 33.82 kg/m     Wt Readings from Last 3 Encounters:  06/28/20 179 lb (81.2 kg)  06/21/20 179 lb (81.2 kg)  05/17/20 176 lb 6.4 oz (80 kg)    Physical Exam Vitals and nursing note reviewed.  Constitutional:      General: She is awake. She is not in acute distress.    Appearance: She is well-developed and well-groomed. She is obese. She is not ill-appearing.  HENT:     Head: Normocephalic.     Comments: Slight diminished hair pattern scalp.    Right Ear: Hearing normal.     Left Ear: Hearing normal.  Eyes:     General: Lids are normal.        Right eye: No discharge.        Left eye: No discharge.     Conjunctiva/sclera: Conjunctivae normal.     Pupils: Pupils are equal, round, and reactive to light.  Neck:     Thyroid: No thyromegaly.     Vascular: No carotid bruit.  Cardiovascular:     Rate and Rhythm: Normal rate and regular rhythm.     Heart sounds: Normal heart sounds. No murmur heard.  No gallop.   Pulmonary:     Effort: Pulmonary effort is normal. No accessory muscle usage or respiratory distress.     Breath sounds: Normal breath sounds.  Abdominal:     General: Bowel sounds are normal.     Palpations: Abdomen is soft.  Musculoskeletal:  Cervical back: Normal range of motion and neck supple.     Right lower leg: No edema.     Left lower leg: No edema.  Skin:    General: Skin is warm and dry.  Neurological:     Mental Status: She is alert and oriented to person, place, and time.  Psychiatric:        Attention and Perception: Attention normal.        Mood and Affect: Mood normal.        Speech: Speech normal.        Behavior: Behavior normal. Behavior is cooperative.        Thought Content: Thought content normal.     Results for orders placed or performed in visit on 06/19/20  CBC with Differential/Platelet  Result Value Ref Range   WBC 8.5 3.4 - 10.8 x10E3/uL   RBC 4.34 3.77 - 5.28 x10E6/uL   Hemoglobin 12.1 11.1 - 15.9 g/dL   Hematocrit 37.5 34.0 - 46.6 %   MCV 86  79 - 97 fL   MCH 27.9 26.6 - 33.0 pg   MCHC 32.3 31 - 35 g/dL   RDW 13.5 11.7 - 15.4 %   Platelets 227 150 - 450 x10E3/uL   Neutrophils 61 Not Estab. %   Lymphs 25 Not Estab. %   Monocytes 7 Not Estab. %   Eos 5 Not Estab. %   Basos 1 Not Estab. %   Neutrophils Absolute 5.2 1 - 7 x10E3/uL   Lymphocytes Absolute 2.1 0 - 3 x10E3/uL   Monocytes Absolute 0.6 0 - 0 x10E3/uL   EOS (ABSOLUTE) 0.4 0.0 - 0.4 x10E3/uL   Basophils Absolute 0.1 0 - 0 x10E3/uL   Immature Granulocytes 1 Not Estab. %   Immature Grans (Abs) 0.1 0.0 - 0.1 x10E3/uL  Comprehensive metabolic panel  Result Value Ref Range   Glucose 119 (H) 65 - 99 mg/dL   BUN 18 8 - 27 mg/dL   Creatinine, Ser 1.24 (H) 0.57 - 1.00 mg/dL   GFR calc non Af Amer 44 (L) >59 mL/min/1.73   GFR calc Af Amer 51 (L) >59 mL/min/1.73   BUN/Creatinine Ratio 15 12 - 28   Sodium 140 134 - 144 mmol/L   Potassium 4.6 3.5 - 5.2 mmol/L   Chloride 103 96 - 106 mmol/L   CO2 22 20 - 29 mmol/L   Calcium 9.8 8.7 - 10.3 mg/dL   Total Protein 7.0 6.0 - 8.5 g/dL   Albumin 4.1 3.8 - 4.8 g/dL   Globulin, Total 2.9 1.5 - 4.5 g/dL   Albumin/Globulin Ratio 1.4 1.2 - 2.2   Bilirubin Total 0.4 0.0 - 1.2 mg/dL   Alkaline Phosphatase 132 (H) 48 - 121 IU/L   AST 17 0 - 40 IU/L   ALT 13 0 - 32 IU/L  Lipid Panel Piccolo, Waived  Result Value Ref Range   Cholesterol Piccolo, Waived 148 <200 mg/dL   HDL Chol Piccolo, Waived 53 (L) >59 mg/dL   Triglycerides Piccolo,Waived 107 <150 mg/dL   Chol/HDL Ratio Piccolo,Waive 2.8 mg/dL   LDL Chol Calc Piccolo Waived 74 <100 mg/dL   VLDL Chol Calc Piccolo,Waive 21 <30 mg/dL  Bayer DCA Hb A1c Waived  Result Value Ref Range   HB A1C (BAYER DCA - WAIVED) 6.2 <7.0 %  Microalbumin, Urine Waived  Result Value Ref Range   Microalb, Ur Waived 30 (H) 0 - 19 mg/L   Creatinine, Urine Waived 200 10 - 300 mg/dL  Microalb/Creat Ratio <30 <30 mg/g  Thyroid Panel With TSH  Result Value Ref Range   TSH 4.430 0.450 - 4.500 uIU/mL    T4, Total 6.5 4.5 - 12.0 ug/dL   T3 Uptake Ratio 21 (L) 24 - 39 %   Free Thyroxine Index 1.4 1.2 - 4.9      Assessment & Plan:   Problem List Items Addressed This Visit      Cardiovascular and Mediastinum   Hypertension - Primary    Chronic, stable with BP on lower side at home and in office -- concern for syncope with this.  Will discontinue Amlodipine and HCTZ at this time and trial only Losartan 50 MG, she denies any edema at baseline -- could increase Losartan if BP elevations present.  Recommend continue to monitor BP daily at home and notify provider if consistent above 130/80.  Will check BMP oupatient.  Recommend focus on DASH diet and modest weight loss.  Continue collaboration with cardiology, appreciate their input.  Return in 8 weeks.      Relevant Medications   losartan (COZAAR) 50 MG tablet   Other Relevant Orders   Basic metabolic panel     Other   Hyperlipidemia    Chronic, ongoing.  Continue Crestor 10 MG Monday/Wednesday/Friday schedule.  Obtain lipid panel outpatient next week with her fasting.  Plan for follow-up in 8 weeks.      Relevant Medications   losartan (COZAAR) 50 MG tablet   Other Relevant Orders   Lipid Panel w/o Chol/HDL Ratio       Follow up plan: Return in about 8 weeks (around 08/23/2020) for HTN --- need future lab visit next week.

## 2020-07-05 ENCOUNTER — Other Ambulatory Visit: Payer: Medicare Other

## 2020-07-05 ENCOUNTER — Other Ambulatory Visit: Payer: Self-pay

## 2020-07-05 DIAGNOSIS — I10 Essential (primary) hypertension: Secondary | ICD-10-CM | POA: Diagnosis not present

## 2020-07-05 DIAGNOSIS — E782 Mixed hyperlipidemia: Secondary | ICD-10-CM

## 2020-07-06 LAB — LIPID PANEL W/O CHOL/HDL RATIO
Cholesterol, Total: 161 mg/dL (ref 100–199)
HDL: 47 mg/dL (ref >39)
LDL Chol Calc (NIH): 87 mg/dL (ref 0–99)
Triglycerides: 155 mg/dL — ABNORMAL HIGH (ref 0–149)
VLDL Cholesterol Cal: 27 mg/dL (ref 5–40)

## 2020-07-06 LAB — BASIC METABOLIC PANEL
BUN/Creatinine Ratio: 10 — ABNORMAL LOW (ref 12–28)
BUN: 11 mg/dL (ref 8–27)
CO2: 27 mmol/L (ref 20–29)
Calcium: 9.3 mg/dL (ref 8.7–10.3)
Chloride: 104 mmol/L (ref 96–106)
Creatinine, Ser: 1.14 mg/dL — ABNORMAL HIGH (ref 0.57–1.00)
GFR calc Af Amer: 57 mL/min/{1.73_m2} — ABNORMAL LOW (ref >59)
GFR calc non Af Amer: 49 mL/min/{1.73_m2} — ABNORMAL LOW (ref >59)
Glucose: 118 mg/dL — ABNORMAL HIGH (ref 65–99)
Potassium: 4.7 mmol/L (ref 3.5–5.2)
Sodium: 142 mmol/L (ref 134–144)

## 2020-07-07 NOTE — Progress Notes (Signed)
Contacted via [place] evening Kendrick Fries.  Your labs have returned: - You continues to show some mild kidney disease, but there is no decline in this.  We will continue to monitor closely. - Cholesterol levels are looking better.  Continue to take your statin as this is helping.  Any questions? Keep being awesome!! Kindest regards, Deshante Cassell

## 2020-09-11 DIAGNOSIS — H2513 Age-related nuclear cataract, bilateral: Secondary | ICD-10-CM | POA: Diagnosis not present

## 2020-09-21 ENCOUNTER — Emergency Department: Payer: Medicare Other

## 2020-09-21 ENCOUNTER — Other Ambulatory Visit: Payer: Self-pay

## 2020-09-21 ENCOUNTER — Encounter: Payer: Self-pay | Admitting: Emergency Medicine

## 2020-09-21 ENCOUNTER — Emergency Department
Admission: EM | Admit: 2020-09-21 | Discharge: 2020-09-21 | Disposition: A | Discharge status: Home / Self Care | Payer: Medicare Other | Attending: Emergency Medicine | Admitting: Emergency Medicine

## 2020-09-21 DIAGNOSIS — I509 Heart failure, unspecified: Secondary | ICD-10-CM | POA: Diagnosis not present

## 2020-09-21 DIAGNOSIS — J45909 Unspecified asthma, uncomplicated: Secondary | ICD-10-CM | POA: Insufficient documentation

## 2020-09-21 DIAGNOSIS — R55 Syncope and collapse: Secondary | ICD-10-CM | POA: Insufficient documentation

## 2020-09-21 DIAGNOSIS — Z79899 Other long term (current) drug therapy: Secondary | ICD-10-CM | POA: Insufficient documentation

## 2020-09-21 DIAGNOSIS — R6889 Other general symptoms and signs: Secondary | ICD-10-CM | POA: Diagnosis not present

## 2020-09-21 DIAGNOSIS — R404 Transient alteration of awareness: Secondary | ICD-10-CM | POA: Diagnosis not present

## 2020-09-21 DIAGNOSIS — I110 Hypertensive heart disease with heart failure: Secondary | ICD-10-CM | POA: Diagnosis not present

## 2020-09-21 DIAGNOSIS — R0902 Hypoxemia: Secondary | ICD-10-CM | POA: Diagnosis not present

## 2020-09-21 DIAGNOSIS — Z7982 Long term (current) use of aspirin: Secondary | ICD-10-CM | POA: Diagnosis not present

## 2020-09-21 DIAGNOSIS — E860 Dehydration: Secondary | ICD-10-CM | POA: Diagnosis not present

## 2020-09-21 DIAGNOSIS — Z743 Need for continuous supervision: Secondary | ICD-10-CM | POA: Diagnosis not present

## 2020-09-21 LAB — BASIC METABOLIC PANEL
Anion gap: 9 (ref 5–15)
BUN: 14 mg/dL (ref 8–23)
CO2: 27 mmol/L (ref 22–32)
Calcium: 9.4 mg/dL (ref 8.9–10.3)
Chloride: 105 mmol/L (ref 98–111)
Creatinine, Ser: 1.31 mg/dL — ABNORMAL HIGH (ref 0.44–1.00)
GFR calc Af Amer: 48 mL/min — ABNORMAL LOW (ref >60)
GFR calc non Af Amer: 41 mL/min — ABNORMAL LOW (ref >60)
Glucose, Bld: 132 mg/dL — ABNORMAL HIGH (ref 70–99)
Potassium: 4.5 mmol/L (ref 3.5–5.1)
Sodium: 141 mmol/L (ref 135–145)

## 2020-09-21 LAB — CBC
HCT: 39.8 % (ref 36.0–46.0)
Hemoglobin: 12.7 g/dL (ref 12.0–15.0)
MCH: 28.2 pg (ref 26.0–34.0)
MCHC: 31.9 g/dL (ref 30.0–36.0)
MCV: 88.2 fL (ref 80.0–100.0)
Platelets: 222 10*3/uL (ref 150–400)
RBC: 4.51 MIL/uL (ref 3.87–5.11)
RDW: 13.1 % (ref 11.5–15.5)
WBC: 10 10*3/uL (ref 4.0–10.5)
nRBC: 0 % (ref 0.0–0.2)

## 2020-09-21 LAB — MAGNESIUM: Magnesium: 2.4 mg/dL (ref 1.7–2.4)

## 2020-09-21 LAB — TROPONIN I (HIGH SENSITIVITY)
Troponin I (High Sensitivity): 4 ng/L (ref <18)
Troponin I (High Sensitivity): 4 ng/L (ref <18)

## 2020-09-21 LAB — BRAIN NATRIURETIC PEPTIDE: B Natriuretic Peptide: 38.2 pg/mL (ref 0.0–100.0)

## 2020-09-21 LAB — TSH: TSH: 4.645 u[IU]/mL — ABNORMAL HIGH (ref 0.350–4.500)

## 2020-09-21 NOTE — ED Provider Notes (Signed)
[facility] Emergency Department Provider Note  ____________________________________________   First MD Initiated Contact with Patient 09/21/20 2147     (approximate)  I have reviewed the triage vital signs and the nursing notes.   HISTORY  Chief Complaint Near Syncope   HPI RENEA SCHOONMAKER is a 69 y.o. female with past medical history of asthma, CHF, depression, HTN, HDL, OCD, and palpitations who presents for assessment after near syncopal episode that occurred earlier today.  Patient states she was out fishing with her family during herself in the heat.  Patient states she did not have loss of consciousness and sat down and she was feeling little nauseous.  She denies any headache, vision changes, chest pain, cough, shortness of breath, diarrhea, dental pain, back pain, rash, extremity pain, focal weakness numbness or paresthesias or other acute complaints.  She does endorse one remote prior similar process.  She states since then she has been able to tolerate some p.o. and feels like she is back to her baseline.  She denies any other acute complaints or concerns at this time.  Denies EtOH or illicit drug use.      Past Medical History:  Diagnosis Date  . Allergy   . Asthma   . Cataract   . CHF (congestive heart failure) ([place])   . Depression   . Hyperlipidemia   . Hypertension   . OCD (obsessive compulsive disorder)   . Palpitations   . Perimenopause     Patient Active Problem List   Diagnosis Date Noted  . Syncope 05/14/2020  . Pneumonia, viral 05/08/2020  . Elevated TSH 02/23/2019  . Lipoma of neck 09/21/2017  . B12 deficiency 09/21/2017  . Elevated hemoglobin A1c 09/21/2017  . Mild cognitive impairment 09/15/2017  . Positive colorectal cancer screening using Cologuard test 08/02/2017  . Tremor 07/27/2017  . Myxoma of heart 09/23/2015  . Major depression, chronic 08/09/2015  . OCD (obsessive compulsive disorder) 08/08/2015  .  Hypertension 08/08/2015  . Hyperlipidemia 08/08/2015    Past Surgical History:  Procedure Laterality Date  . CHOLECYSTECTOMY    . COLONOSCOPY WITH PROPOFOL N/A 09/07/2017   Procedure: COLONOSCOPY WITH PROPOFOL;  Surgeon: [name], MD;  Location[facility] ENDOSCOPY;  Service: Endoscopy;  Laterality: N/A;  . open heart surgery    . ROOT CANAL     x2    Prior to Admission medications   Medication Sig Start Date End Date Taking? Authorizing Provider  aspirin 81 MG tablet Take 81 mg by mouth daily.    [provider]  cholecalciferol (VITAMIN D3) 25 MCG (1000 UT) tablet Take 1,000 Units by mouth daily.    [provider]  losartan (COZAAR) 50 MG tablet Take 1 tablet (50 mg total) by mouth daily. 06/28/20   [name] T, NP  Multiple Vitamin (MULTIVITAMIN) tablet Take 1 tablet by mouth daily.    [provider]  PARoxetine (PAXIL) 40 MG tablet Take 0.5 tablets (20 mg total) by mouth daily. Take 1/2 dose 05/27/20   [name] T, NP  rosuvastatin (CRESTOR) 10 MG tablet Take 1 tablet (10 MG) by mouth on Monday, Wednesday, and Friday. 05/17/20   Cannady, Henrine Screws T, NP  vitamin B-12 (CYANOCOBALAMIN) 1000 MCG tablet Take 1,000 mcg by mouth daily.    [provider]    Allergies Penicillins, Biaxin [clarithromycin], and Lisinopril  Family History  Problem Relation Age of Onset  . Hypertension Mother   . Dementia Father   . Hypertension Father   .  Diabetes Father   . Depression Father   . GI problems Daughter   . Heart disease Maternal Grandfather   . Depression Sister     Social History Social History   Tobacco Use  . Smoking status: Never Smoker  . Smokeless tobacco: Never Used  Vaping Use  . Vaping Use: Never used  Substance Use Topics  . Alcohol use: No    Alcohol/week: 0.0 standard drinks  . Drug use: No    Review of Systems  Review of Systems  Constitutional: Negative for chills and fever.  HENT: Negative for sore throat.     Eyes: Negative for pain.  Respiratory: Negative for cough and stridor.   Cardiovascular: Negative for chest pain.  Gastrointestinal: Negative for vomiting.  Genitourinary: Negative for dysuria.  Musculoskeletal: Negative for myalgias.  Skin: Negative for rash.  Neurological: Positive for dizziness. Negative for seizures, loss of consciousness and headaches.  Psychiatric/Behavioral: Negative for suicidal ideas.  All other systems reviewed and are negative.     ____________________________________________   PHYSICAL EXAM:  VITAL SIGNS: ED Triage Vitals  Enc Vitals Group     BP 09/21/20 1539 (!) 119/47     Pulse Rate 09/21/20 1539 81     Resp 09/21/20 1539 16     Temp 09/21/20 1539 98.2 F (36.8 C)     Temp Source 09/21/20 1539 Oral     SpO2 09/21/20 1539 100 %     Weight 09/21/20 1540 189 lb (85.7 kg)     Height 09/21/20 1540 5\' 1"  (1.549 m)     Head Circumference --      Peak Flow --      Pain Score 09/21/20 1539 0     Pain Loc --      Pain Edu? --      Excl. in [place]? --    Vitals:   09/21/20 2238 09/21/20 2240  BP: (!) 153/78 (!) 168/82  Pulse: 83 71  Resp:    Temp:    SpO2:     Physical Exam Vitals and nursing note reviewed.  Constitutional:      General: She is not in acute distress.    Appearance: Normal appearance. She is well-developed and normal weight.  HENT:     Head: Normocephalic and atraumatic.     Right Ear: External ear normal.     Left Ear: External ear normal.     Nose: Nose normal.  Eyes:     Conjunctiva/sclera: Conjunctivae normal.  Cardiovascular:     Rate and Rhythm: Normal rate and regular rhythm.     Heart sounds: No murmur heard.   Pulmonary:     Effort: Pulmonary effort is normal. No respiratory distress.     Breath sounds: Normal breath sounds.  Abdominal:     Palpations: Abdomen is soft.     Tenderness: There is no abdominal tenderness.  Musculoskeletal:     Cervical back: Neck supple.  Skin:    General: Skin is warm and  dry.     Capillary Refill: Capillary refill takes less than 2 seconds.  Neurological:     General: No focal deficit present.     Mental Status: She is alert.     Comments: Cranial nerves II through XII grossly intact.  No pronator drift.  No finger dysmetria.  Patient has full and symmetric strength of all extremities.  She has sensation intact to light touch at all extremities.  Psychiatric:        Mood  and Affect: Mood normal.      ____________________________________________   LABS (all labs ordered are listed, but only abnormal results are displayed)  Labs Reviewed  BASIC METABOLIC PANEL - Abnormal; Notable for the following components:      Result Value   Glucose, Bld 132 (*)    Creatinine, Ser 1.31 (*)    GFR calc non Af Amer 41 (*)    GFR calc Af Amer 48 (*)    All other components within normal limits  TSH - Abnormal; Notable for the following components:   TSH 4.645 (*)    All other components within normal limits  CBC  BRAIN NATRIURETIC PEPTIDE  MAGNESIUM  CBG MONITORING, ED  TROPONIN I (HIGH SENSITIVITY)  TROPONIN I (HIGH SENSITIVITY)   ____________________________________________  EKG  Sinus rhythm with a ventricular rate of 77, normal axis, prolonged first-degree interval with otherwise unremarkable intervals and no clear evidence of acute ischemia or other significant underlying arrhythmia. ____________________________________________  RADIOLOGY  ED MD interpretation: No focal consolidation, large effusion, edema, pneumothorax, or other acute intrathoracic process.  Official radiology report(s): DG Chest 2 View  Result Date: 09/21/2020 CLINICAL DATA:  Near syncope EXAM: CHEST - 2 VIEW COMPARISON:  05/14/2020 FINDINGS: Lungs are clear.  No pleural effusion or pneumothorax. The heart is normal in size. Postsurgical changes related to prior CABG. Thoracolumbar spine is within normal limits.  Median sternotomy. IMPRESSION: Normal chest radiographs.  Electronically Signed   By: Julian Hy M.D.   On: 09/21/2020 17:59    ____________________________________________   PROCEDURES  Procedure(s) performed (including Critical Care):  Procedures   ____________________________________________   INITIAL IMPRESSION / ASSESSMENT AND PLAN / ED COURSE        Patient presents with [place] to history exam after near syncopal episode that occurred earlier today while she was outside fishing with her family in the heat.  Patient is slightly hypertensive otherwise stable vital signs on arrival.  Exam as above.  Differential includes mild dehydration, vasovagal, metabolic derangements, ACS, arrhythmia, CHF, cardiomyopathy, PE, acute infectious process, and CVA.  Patient has no focal neurological deficit to suggest CVA.  She has no history of chest pain and EKG and to nonelevated troponins are not consistent with ACS.  Low suspicion for PE as patient denies any shortness of breath and is not tachypneic, tachycardic, or hypoxic and states she is at her baseline.  Chest x-ray shows no evidence of volume overload and patient does not appear volume overloaded on exam.  No significant murmurs auscultated on exam to suggest significant acute mitral regurg or aortic stenosis.  There is no significant arrhythmia on ECG.  TSH is slightly elevated but in line with multiple prior levels.  Patient is not orthostatic.  Advised patient that her TSH is slightly elevated and her blood pressure was slightly elevated as well and that she should follow-up both of these with her PCP in the next 7 to 10 days.  Patient has been hydrating by mouth.  Given stable vital signs with reassuring exam and work-up and patient denying any symptoms at this time I believe she is safe for discharge with plan for outpatient PCP follow-up.  Discharged stable condition.  Strict return precautions advised discussed.   ____________________________________________   FINAL CLINICAL  IMPRESSION(S) / ED DIAGNOSES  Final diagnoses:  Near syncope    Medications - No data to display   ED Discharge Orders    None       Note:  This document was  prepared using Systems analyst and may include unintentional dictation errors.   Lucrezia Starch, MD 09/21/20 2251

## 2020-09-21 NOTE — ED Notes (Signed)
DC reviewed by RN and provider, pt ambulated independently to [place] to wait for son in law

## 2020-09-21 NOTE — ED Triage Notes (Signed)
Pt here for near syncope today. Was out fishing and when climbing up a hill got dizzy and almost passed out.  Has had problems in past r/t medications.  No fever. Unlabored.  VSS.  No longer feeling dizzy at this time.

## 2020-09-21 NOTE — ED Notes (Signed)
Outside been fishing. Syncopal episode less than a minute. 22 point drop in systolic BP from sitting to standing. Increase in heart rate as well. 20g L FA 500 Ml bolus of NS. 12 lead okay.

## 2020-09-21 NOTE — ED Notes (Signed)
Assumed care of pt upon being roomed. Denies complaints at this time. States she was fishing and had a near syncopal episode, which she has had in the best. While fishing pt denies drinking appropriate amount of water. Symptoms are relieved and denies repeat syncopal symptoms. AO x4. Talking in full sentences with regular and unlabored breathing.

## 2020-09-27 ENCOUNTER — Telehealth: Payer: Self-pay

## 2020-09-27 NOTE — Telephone Encounter (Signed)
Please advise.   Copied from [place] [phone]. Topic: General - Other >> Sep 27, 2020  1:46 PM [name] A wrote: Reason for CRM: Patient called to inform [name] that she went to the ER after passing out from heat exhaustion on 09/21/20 but stated that she is doing well now. Refused to schedule a follow up appointment but asking Jolene to give her a call whenever possible. Can be reached at Ph# [phone] or [phone]

## 2020-09-27 NOTE — Telephone Encounter (Signed)
Please reach out to patient Monday to see if any concerns or questions I can address.  Thank you.

## 2020-09-30 NOTE — Telephone Encounter (Signed)
Called and LVM asking for patient to please return my call.  

## 2020-10-01 NOTE — Telephone Encounter (Signed)
Called and LVM asking for patient to please return my call if she still had any questions or concerns for [place].

## 2020-10-21 DIAGNOSIS — H2513 Age-related nuclear cataract, bilateral: Secondary | ICD-10-CM | POA: Diagnosis not present

## 2020-11-08 ENCOUNTER — Encounter: Payer: Self-pay | Admitting: Nurse Practitioner

## 2020-11-08 ENCOUNTER — Other Ambulatory Visit: Payer: Self-pay

## 2020-11-08 ENCOUNTER — Ambulatory Visit (INDEPENDENT_AMBULATORY_CARE_PROVIDER_SITE_OTHER): Payer: Medicare Other | Admitting: Nurse Practitioner

## 2020-11-08 VITALS — BP 128/78 | HR 73 | Temp 98.3°F | Wt 185.2 lb

## 2020-11-08 DIAGNOSIS — I10 Essential (primary) hypertension: Secondary | ICD-10-CM

## 2020-11-08 DIAGNOSIS — R7309 Other abnormal glucose: Secondary | ICD-10-CM | POA: Diagnosis not present

## 2020-11-08 DIAGNOSIS — E782 Mixed hyperlipidemia: Secondary | ICD-10-CM

## 2020-11-08 DIAGNOSIS — F329 Major depressive disorder, single episode, unspecified: Secondary | ICD-10-CM

## 2020-11-08 DIAGNOSIS — E538 Deficiency of other specified B group vitamins: Secondary | ICD-10-CM | POA: Diagnosis not present

## 2020-11-08 DIAGNOSIS — E669 Obesity, unspecified: Secondary | ICD-10-CM | POA: Insufficient documentation

## 2020-11-08 DIAGNOSIS — R7989 Other specified abnormal findings of blood chemistry: Secondary | ICD-10-CM | POA: Diagnosis not present

## 2020-11-08 DIAGNOSIS — D519 Vitamin B12 deficiency anemia, unspecified: Secondary | ICD-10-CM | POA: Diagnosis not present

## 2020-11-08 DIAGNOSIS — E6609 Other obesity due to excess calories: Secondary | ICD-10-CM

## 2020-11-08 DIAGNOSIS — Z6834 Body mass index (BMI) 34.0-34.9, adult: Secondary | ICD-10-CM

## 2020-11-08 NOTE — Assessment & Plan Note (Addendum)
Chronic, stable.  Denies SI/HI.  Continue current medication regimen and adjust as needed.  Due to age would benefit from change to Zoloft or alternate SSRI in future and discontinuation Paxil, she is aware of this and wishes to maintain regimen at this time.  Follow-up in 6 months.

## 2020-11-08 NOTE — Progress Notes (Signed)
BP 128/78 (BP Location: Left Arm)    Pulse 73    Temp 98.3 F (36.8 C)    Wt 185 lb 3.2 oz (84 kg)    LMP  (LMP Unknown)    SpO2 98%    BMI 34.99 kg/m    Subjective:    Patient ID: Lisa Bass, female    DOB: Jan 30, 1951, 69 y.o.   MRN: [id number]  HPI: Lisa Bass is a 69 y.o. female  Chief Complaint  Patient presents with   Hypertension   Prediabetes   mood   HYPERTENSION / HYPERLIPIDEMIA Currently taking Crestor 10 MG M/W/F schedule.  Continues on Losartan 50 MG daily.  Does have underlying B12 deficiency with last level one year ago == 290. Taking B12 1000 MCG daily.  Satisfied with current treatment? yes Duration of hypertension: chronic BP monitoring frequency: daily BP range: 130-140/80 range BP medication side effects: no Duration of hyperlipidemia: chronic Cholesterol medication side effects: no Cholesterol supplements: none Medication compliance: good compliance Aspirin: yes Recent stressors: no Recurrent headaches: no Visual changes: no Palpitations: no Dyspnea: no Chest pain: no Lower extremity edema: no Dizzy/lightheaded: no   ELEVATED TSH In hospital on 09/21/20, TSH 4.645. Fatigue: no Cold intolerance: no Heat intolerance: no Weight gain: no Weight loss: no Constipation: no Diarrhea/loose stools: no Palpitations: no Lower extremity edema: no Anxiety/depressed mood: no   PREDIABETES Last A1C was 6.2% in June 2021. Polydipsia/polyuria: no Visual disturbance: no Chest pain: no Paresthesias: no   DEPRESSION Continues on Paxil 40 MG daily. Mood status: stable Satisfied with current treatment?: yes Symptom severity: mild Duration of current treatment : chronic Side effects: no Medication compliance: good compliance Psychotherapy/counseling: none Depressed mood: no Anxious mood: no Anhedonia: no Significant weight loss or gain: no Insomnia: none Fatigue: no Feelings of worthlessness or guilt: no Impaired  concentration/indecisiveness: no Suicidal ideations: no Hopelessness: no Crying spells: no Depression screen [facility] 2/9 11/08/2020 06/21/2020 05/08/2020 02/22/2019 02/13/2019  Decreased Interest 0 0 1 0 0  Down, Depressed, Hopeless 0 0 0 0 0  PHQ - 2 Score 0 0 1 0 0  Altered sleeping 0 0 0 1 -  Tired, decreased energy 0 0 0 0 -  Change in appetite 0 0 0 1 -  Feeling bad or failure about yourself  0 0 0 1 -  Trouble concentrating 0 0 0 0 -  Moving slowly or fidgety/restless 0 0 0 1 -  Suicidal thoughts 0 0 0 0 -  PHQ-9 Score 0 0 1 4 -  Difficult doing work/chores - Not difficult at all Not difficult at all Not difficult at all -  Some recent data might be hidden    Relevant past medical, surgical, family and social history reviewed and updated as indicated. Interim medical history since our last visit reviewed. Allergies and medications reviewed and updated.  Review of Systems  Constitutional: Negative for activity change, appetite change, diaphoresis, fatigue and fever.  Respiratory: Negative for cough, chest tightness and shortness of breath.   Cardiovascular: Negative for chest pain, palpitations and leg swelling.  Gastrointestinal: Negative.   Neurological: Negative.   Psychiatric/Behavioral: Negative.     Per HPI unless specifically indicated above     Objective:    BP 128/78 (BP Location: Left Arm)    Pulse 73    Temp 98.3 F (36.8 C)    Wt 185 lb 3.2 oz (84 kg)    LMP  (LMP Unknown)    SpO2 98%  BMI 34.99 kg/m   Wt Readings from Last 3 Encounters:  11/08/20 185 lb 3.2 oz (84 kg)  09/21/20 189 lb (85.7 kg)  06/28/20 179 lb (81.2 kg)    Physical Exam Vitals and nursing note reviewed.  Constitutional:      General: She is awake. She is not in acute distress.    Appearance: She is well-developed and well-groomed. She is obese. She is not ill-appearing.  HENT:     Head: Normocephalic.     Comments: Slight diminished hair pattern scalp.    Right Ear: Hearing normal.      Left Ear: Hearing normal.  Eyes:     General: Lids are normal.        Right eye: No discharge.        Left eye: No discharge.     Conjunctiva/sclera: Conjunctivae normal.     Pupils: Pupils are equal, round, and reactive to light.  Neck:     Thyroid: No thyromegaly.     Vascular: No carotid bruit.  Cardiovascular:     Rate and Rhythm: Normal rate and regular rhythm.     Heart sounds: Normal heart sounds. No murmur heard.  No gallop.   Pulmonary:     Effort: Pulmonary effort is normal. No accessory muscle usage or respiratory distress.     Breath sounds: Normal breath sounds.  Abdominal:     General: Bowel sounds are normal.     Palpations: Abdomen is soft.  Musculoskeletal:     Cervical back: Normal range of motion and neck supple.     Right lower leg: No edema.     Left lower leg: No edema.  Skin:    General: Skin is warm and dry.  Neurological:     Mental Status: She is alert and oriented to person, place, and time.  Psychiatric:        Attention and Perception: Attention normal.        Mood and Affect: Mood normal.        Speech: Speech normal.        Behavior: Behavior normal. Behavior is cooperative.        Thought Content: Thought content normal.     Results for orders placed or performed during the hospital encounter of 60/63/01  Basic metabolic panel  Result Value Ref Range   Sodium 141 135 - 145 mmol/L   Potassium 4.5 3.5 - 5.1 mmol/L   Chloride 105 98 - 111 mmol/L   CO2 27 22 - 32 mmol/L   Glucose, Bld 132 (H) 70 - 99 mg/dL   BUN 14 8 - 23 mg/dL   Creatinine, Ser 1.31 (H) 0.44 - 1.00 mg/dL   Calcium 9.4 8.9 - 10.3 mg/dL   GFR calc non Af Amer 41 (L) >60 mL/min   GFR calc Af Amer 48 (L) >60 mL/min   Anion gap 9 5 - 15  CBC  Result Value Ref Range   WBC 10.0 4.0 - 10.5 K/uL   RBC 4.51 3.87 - 5.11 MIL/uL   Hemoglobin 12.7 12.0 - 15.0 g/dL   HCT 39.8 36 - 46 %   MCV 88.2 80.0 - 100.0 fL   MCH 28.2 26.0 - 34.0 pg   MCHC 31.9 30.0 - 36.0 g/dL   RDW 13.1  11.5 - 15.5 %   Platelets 222 150 - 400 K/uL   nRBC 0.0 0.0 - 0.2 %  Brain natriuretic peptide  Result Value Ref Range   B Natriuretic Peptide  38.2 0.0 - 100.0 pg/mL  Magnesium  Result Value Ref Range   Magnesium 2.4 1.7 - 2.4 mg/dL  TSH  Result Value Ref Range   TSH 4.645 (H) 0.350 - 4.500 uIU/mL  Troponin I (High Sensitivity)  Result Value Ref Range   Troponin I (High Sensitivity) 4 <18 ng/L  Troponin I (High Sensitivity)  Result Value Ref Range   Troponin I (High Sensitivity) 4 <18 ng/L      Assessment & Plan:   Problem List Items Addressed This Visit      Cardiovascular and Mediastinum   Hypertension - Primary    Chronic, stable with initial BP elevated, but repeat below goal today.  Continue Losartan 50 MG, she denies any edema at baseline -- could increase Losartan if BP elevations present.  Recommend continue to monitor BP daily at home and notify provider if consistent above 130/80.  Will check BMP today.  Recommend focus on DASH diet and modest weight loss.  Continue collaboration with cardiology, appreciate their input.  Return in 6 months.      Relevant Orders   Basic metabolic panel     Other   Hyperlipidemia    Chronic, ongoing.  Continue Crestor 10 MG Monday/Wednesday/Friday schedule.  Obtain lipid panel today.  Return in 6 months.      Relevant Orders   Lipid Panel w/o Chol/HDL Ratio   Major depression, chronic    Chronic, stable.  Denies SI/HI.  Continue current medication regimen and adjust as needed.  Due to age would benefit from change to Zoloft or alternate SSRI in future and discontinuation Paxil, she is aware of this and wishes to maintain regimen at this time.  Follow-up in 6 months.      B12 deficiency    Ongoing. Continue supplement and recheck B12 level today.  Adjust as needed.      Relevant Orders   B12   Elevated hemoglobin A1c    Noted on past labs at 6.2%, recheck today and recommend focus on diet changes and regular activity.  If  elevation consider starting medication.      Relevant Orders   HgB A1c   Elevated TSH    Noted on past labs, will recheck TSH and Free T4 today -- asymptomatic at this time, suspect subclinical hypothyroid and may benefit from medication in future, but she wishes to hold off until after cataract surgery in January.      Relevant Orders   T4, free   TSH   Obesity    Recommended eating smaller high protein, low fat meals more frequently and exercising 30 mins a day 5 times a week with a goal of 10-15lb weight loss in the next 3 months. Patient voiced their understanding and motivation to adhere to these recommendations.           Follow up plan: Return in about 6 months (around 05/08/2021) for HTN/HLD, MOOD, PREDIABETES, ELEVATED TSH.

## 2020-11-08 NOTE — Assessment & Plan Note (Signed)
Ongoing. Continue supplement and recheck B12 level today.  Adjust as needed.

## 2020-11-08 NOTE — Assessment & Plan Note (Signed)
Recommended eating smaller high protein, low fat meals more frequently and exercising 30 mins a day 5 times a week with a goal of 10-15lb weight loss in the next 3 months. Patient voiced their understanding and motivation to adhere to these recommendations.  

## 2020-11-08 NOTE — Assessment & Plan Note (Signed)
Chronic, stable with initial BP elevated, but repeat below goal today.  Continue Losartan 50 MG, she denies any edema at baseline -- could increase Losartan if BP elevations present.  Recommend continue to monitor BP daily at home and notify provider if consistent above 130/80.  Will check BMP today.  Recommend focus on DASH diet and modest weight loss.  Continue collaboration with cardiology, appreciate their input.  Return in 6 months.

## 2020-11-08 NOTE — Patient Instructions (Signed)
DASH Eating Plan DASH stands for "Dietary Approaches to Stop Hypertension." The DASH eating plan is a healthy eating plan that has been shown to reduce high blood pressure (hypertension). It may also reduce your risk for type 2 diabetes, heart disease, and stroke. The DASH eating plan may also help with weight loss. What are tips for following this plan?  General guidelines  Avoid eating more than 2,300 mg (milligrams) of salt (sodium) a day. If you have hypertension, you may need to reduce your sodium intake to 1,500 mg a day.  Limit alcohol intake to no more than 1 drink a day for nonpregnant women and 2 drinks a day for men. One drink equals 12 oz of beer, 5 oz of wine, or 1 oz of hard liquor.  Work with your health care provider to maintain a healthy body weight or to lose weight. Ask what an ideal weight is for you.  Get at least 30 minutes of exercise that causes your heart to beat faster (aerobic exercise) most days of the week. Activities may include walking, swimming, or biking.  Work with your health care provider or diet and nutrition specialist (dietitian) to adjust your eating plan to your individual calorie needs. Reading food labels   Check food labels for the amount of sodium per serving. Choose foods with less than 5 percent of the Daily Value of sodium. Generally, foods with less than 300 mg of sodium per serving fit into this eating plan.  To find whole grains, look for the word "whole" as the first word in the ingredient list. Shopping  Buy products labeled as "low-sodium" or "no salt added."  Buy fresh foods. Avoid canned foods and premade or frozen meals. Cooking  Avoid adding salt when cooking. Use salt-free seasonings or herbs instead of table salt or sea salt. Check with your health care provider or pharmacist before using salt substitutes.  Do not fry foods. Cook foods using healthy methods such as baking, boiling, grilling, and broiling instead.  Cook with  heart-healthy oils, such as olive, canola, soybean, or sunflower oil. Meal planning  Eat a balanced diet that includes: ? 5 or more servings of fruits and vegetables each day. At each meal, try to fill half of your plate with fruits and vegetables. ? Up to 6-8 servings of whole grains each day. ? Less than 6 oz of lean meat, poultry, or fish each day. A 3-oz serving of meat is about the same size as a deck of cards. One egg equals 1 oz. ? 2 servings of low-fat dairy each day. ? A serving of nuts, seeds, or beans 5 times each week. ? Heart-healthy fats. Healthy fats called Omega-3 fatty acids are found in foods such as flaxseeds and coldwater fish, like sardines, salmon, and mackerel.  Limit how much you eat of the following: ? Canned or prepackaged foods. ? Food that is high in trans fat, such as fried foods. ? Food that is high in saturated fat, such as fatty meat. ? Sweets, desserts, sugary drinks, and other foods with added sugar. ? Full-fat dairy products.  Do not salt foods before eating.  Try to eat at least 2 vegetarian meals each week.  Eat more home-cooked food and less restaurant, buffet, and fast food.  When eating at a restaurant, ask that your food be prepared with less salt or no salt, if possible. What foods are recommended? The items listed may not be a complete list. Talk with your dietitian about   what dietary choices are best for you. Grains Whole-grain or whole-wheat bread. Whole-grain or whole-wheat pasta. Brown rice. Oatmeal. Quinoa. Bulgur. Whole-grain and low-sodium cereals. Pita bread. Low-fat, low-sodium crackers. Whole-wheat flour tortillas. Vegetables Fresh or frozen vegetables (raw, steamed, roasted, or grilled). Low-sodium or reduced-sodium tomato and vegetable juice. Low-sodium or reduced-sodium tomato sauce and tomato paste. Low-sodium or reduced-sodium canned vegetables. Fruits All fresh, dried, or frozen fruit. Canned fruit in natural juice (without  added sugar). Meat and other protein foods Skinless chicken or turkey. Ground chicken or turkey. Pork with fat trimmed off. Fish and seafood. Egg whites. Dried beans, peas, or lentils. Unsalted nuts, nut butters, and seeds. Unsalted canned beans. Lean cuts of beef with fat trimmed off. Low-sodium, lean deli meat. Dairy Low-fat (1%) or fat-free (skim) milk. Fat-free, low-fat, or reduced-fat cheeses. Nonfat, low-sodium ricotta or cottage cheese. Low-fat or nonfat yogurt. Low-fat, low-sodium cheese. Fats and oils Soft margarine without trans fats. Vegetable oil. Low-fat, reduced-fat, or light mayonnaise and salad dressings (reduced-sodium). Canola, safflower, olive, soybean, and sunflower oils. Avocado. Seasoning and other foods Herbs. Spices. Seasoning mixes without salt. Unsalted popcorn and pretzels. Fat-free sweets. What foods are not recommended? The items listed may not be a complete list. Talk with your dietitian about what dietary choices are best for you. Grains Baked goods made with fat, such as croissants, muffins, or some breads. Dry pasta or rice meal packs. Vegetables Creamed or fried vegetables. Vegetables in a cheese sauce. Regular canned vegetables (not low-sodium or reduced-sodium). Regular canned tomato sauce and paste (not low-sodium or reduced-sodium). Regular tomato and vegetable juice (not low-sodium or reduced-sodium). Pickles. Olives. Fruits Canned fruit in a light or heavy syrup. Fried fruit. Fruit in cream or butter sauce. Meat and other protein foods Fatty cuts of meat. Ribs. Fried meat. Bacon. Sausage. Bologna and other processed lunch meats. Salami. Fatback. Hotdogs. Bratwurst. Salted nuts and seeds. Canned beans with added salt. Canned or smoked fish. Whole eggs or egg yolks. Chicken or turkey with skin. Dairy Whole or 2% milk, cream, and half-and-half. Whole or full-fat cream cheese. Whole-fat or sweetened yogurt. Full-fat cheese. Nondairy creamers. Whipped toppings.  Processed cheese and cheese spreads. Fats and oils Butter. Stick margarine. Lard. Shortening. Ghee. Bacon fat. Tropical oils, such as coconut, palm kernel, or palm oil. Seasoning and other foods Salted popcorn and pretzels. Onion salt, garlic salt, seasoned salt, table salt, and sea salt. Worcestershire sauce. Tartar sauce. Barbecue sauce. Teriyaki sauce. Soy sauce, including reduced-sodium. Steak sauce. Canned and packaged gravies. Fish sauce. Oyster sauce. Cocktail sauce. Horseradish that you find on the shelf. Ketchup. Mustard. Meat flavorings and tenderizers. Bouillon cubes. Hot sauce and Tabasco sauce. Premade or packaged marinades. Premade or packaged taco seasonings. Relishes. Regular salad dressings. Where to find more information:  National Heart, Lung, and Blood Institute: www.nhlbi.nih.gov  American Heart Association: www.heart.org Summary  The DASH eating plan is a healthy eating plan that has been shown to reduce high blood pressure (hypertension). It may also reduce your risk for type 2 diabetes, heart disease, and stroke.  With the DASH eating plan, you should limit salt (sodium) intake to 2,300 mg a day. If you have hypertension, you may need to reduce your sodium intake to 1,500 mg a day.  When on the DASH eating plan, aim to eat more fresh fruits and vegetables, whole grains, lean proteins, low-fat dairy, and heart-healthy fats.  Work with your health care provider or diet and nutrition specialist (dietitian) to adjust your eating plan to your   individual calorie needs. This information is not intended to replace advice given to you by your health care provider. Make sure you discuss any questions you have with your health care provider. Document Revised: 11/19/2017 Document Reviewed: 11/30/2016 Elsevier Patient Education  2020 Elsevier Inc.  

## 2020-11-08 NOTE — Assessment & Plan Note (Signed)
Noted on past labs, will recheck TSH and Free T4 today -- asymptomatic at this time, suspect subclinical hypothyroid and may benefit from medication in future, but she wishes to hold off until after cataract surgery in January.

## 2020-11-08 NOTE — Assessment & Plan Note (Signed)
Chronic, ongoing.  Continue Crestor 10 MG Monday/Wednesday/Friday schedule.  Obtain lipid panel today.  Return in 6 months.

## 2020-11-08 NOTE — Assessment & Plan Note (Signed)
Noted on past labs at 6.2%, recheck today and recommend focus on diet changes and regular activity.  If elevation consider starting medication.

## 2020-11-09 LAB — BASIC METABOLIC PANEL
BUN/Creatinine Ratio: 10 — ABNORMAL LOW (ref 12–28)
BUN: 10 mg/dL (ref 8–27)
CO2: 24 mmol/L (ref 20–29)
Calcium: 9.8 mg/dL (ref 8.7–10.3)
Chloride: 102 mmol/L (ref 96–106)
Creatinine, Ser: 1.01 mg/dL — ABNORMAL HIGH (ref 0.57–1.00)
GFR calc Af Amer: 65 mL/min/{1.73_m2} (ref >59)
GFR calc non Af Amer: 57 mL/min/{1.73_m2} — ABNORMAL LOW (ref >59)
Glucose: 89 mg/dL (ref 65–99)
Potassium: 4.7 mmol/L (ref 3.5–5.2)
Sodium: 137 mmol/L (ref 134–144)

## 2020-11-09 LAB — VITAMIN B12: Vitamin B-12: 745 pg/mL (ref 232–1245)

## 2020-11-09 LAB — LIPID PANEL W/O CHOL/HDL RATIO
Cholesterol, Total: 153 mg/dL (ref 100–199)
HDL: 53 mg/dL (ref >39)
LDL Chol Calc (NIH): 74 mg/dL (ref 0–99)
Triglycerides: 152 mg/dL — ABNORMAL HIGH (ref 0–149)
VLDL Cholesterol Cal: 26 mg/dL (ref 5–40)

## 2020-11-09 LAB — HEMOGLOBIN A1C
Est. average glucose Bld gHb Est-mCnc: 134 mg/dL
Hgb A1c MFr Bld: 6.3 % — ABNORMAL HIGH (ref 4.8–5.6)

## 2020-11-09 LAB — T4, FREE: Free T4: 0.96 ng/dL (ref 0.82–1.77)

## 2020-11-09 LAB — TSH: TSH: 2.03 u[IU]/mL (ref 0.450–4.500)

## 2020-11-10 NOTE — Progress Notes (Signed)
Contacted via [place] morning Berdene, your labs have returned: - Thyroid labs are normal this check, no changes needed - Kidney function continues to show some mild kidney disease we will continue to monitor, we will maintain you on Losartan which offers some kidney protection.   - Cholesterol levels, look good with exception of some mild elevation in triglycerides we will continue to monitor closely. - B12 level normal, continue supplement daily. - The A1C is the diabetes testing we talked about, this looks at your blood sugars over the past 3 months and turns the average into a number.  Your number is 6.3%, meaning you are prediabetic and this number is creeping up some.  Any number 5.7 to 6.4 is considered prediabetes and any number 6.5 or greater is considered diabetes.   I would recommend heavy focus on decreasing foods high in sugar and your intake of things like bread products, pasta, and rice.  The American Diabetes Association online has a large amount of information on diet changes to make.  We will recheck this number in 3 months to ensure you are not continuing to trend upwards and move into diabetes.  Have a good day. Keep being awesome!!  Thank you for allowing me to participate in your care. Kindest regards, Tabitha Riggins

## 2020-12-27 ENCOUNTER — Other Ambulatory Visit: Payer: Self-pay | Admitting: Nurse Practitioner

## 2020-12-27 ENCOUNTER — Telehealth: Payer: Self-pay | Admitting: Nurse Practitioner

## 2020-12-27 MED ORDER — ONDANSETRON 4 MG PO TBDP: 4.0000 mg | ORAL_TABLET | Freq: Three times a day (TID) | ORAL | 0 refills | Status: DC | PRN

## 2020-12-27 NOTE — Telephone Encounter (Signed)
I sent in small amount of Zofran, but I do highly recommend a virtual visit next week and testing at office please.  IF worsening over weekend I want her to immediately go to urgent care or ER for assessment.

## 2020-12-27 NOTE — Telephone Encounter (Signed)
Called pt to schedule she refused states that she can't wait that long she states that her stomach is really bothering her. She states that she was exposed to covid as to the reason why she quarantined 14 days. Pt states that she is going to call her pharmacist and see what they can do for her. Please advise.

## 2020-12-27 NOTE — Telephone Encounter (Signed)
Pt called stating that she has been having covid symptoms. She states that she has quarantined for 14 days, but that she is still having stomach issues ( watery diarrhea & some pain). She is requesting to have PCP send something in for her. Please advise.      [facility] - [place], [place] - [address]  [place] [address]  Phone: [phone] Fax: [phone]  Hours: Not open 24 hours

## 2020-12-31 NOTE — Telephone Encounter (Signed)
Noted  

## 2020-12-31 NOTE — Telephone Encounter (Signed)
Patient called back and said she is feeling better after taking the medication.  She stated that she does not need an appt.

## 2020-12-31 NOTE — Telephone Encounter (Signed)
Called pt to see if she was feeling better or needed an appt. No answer left vm

## 2021-02-18 HISTORY — PX: CATARACT EXTRACTION, BILATERAL: SHX1313

## 2021-02-20 DIAGNOSIS — I10 Essential (primary) hypertension: Secondary | ICD-10-CM | POA: Diagnosis not present

## 2021-02-20 DIAGNOSIS — H25812 Combined forms of age-related cataract, left eye: Secondary | ICD-10-CM | POA: Diagnosis not present

## 2021-02-20 DIAGNOSIS — H52222 Regular astigmatism, left eye: Secondary | ICD-10-CM | POA: Diagnosis not present

## 2021-02-20 DIAGNOSIS — H524 Presbyopia: Secondary | ICD-10-CM | POA: Diagnosis not present

## 2021-03-06 DIAGNOSIS — I10 Essential (primary) hypertension: Secondary | ICD-10-CM | POA: Diagnosis not present

## 2021-03-06 DIAGNOSIS — H52221 Regular astigmatism, right eye: Secondary | ICD-10-CM | POA: Diagnosis not present

## 2021-03-06 DIAGNOSIS — H25811 Combined forms of age-related cataract, right eye: Secondary | ICD-10-CM | POA: Diagnosis not present

## 2021-05-09 ENCOUNTER — Other Ambulatory Visit: Payer: Self-pay

## 2021-05-09 ENCOUNTER — Encounter: Payer: Self-pay | Admitting: Nurse Practitioner

## 2021-05-09 ENCOUNTER — Ambulatory Visit (INDEPENDENT_AMBULATORY_CARE_PROVIDER_SITE_OTHER): Payer: Medicare Other | Admitting: Nurse Practitioner

## 2021-05-09 VITALS — BP 126/67 | HR 65 | Temp 98.4°F | Wt 182.0 lb

## 2021-05-09 DIAGNOSIS — R7309 Other abnormal glucose: Secondary | ICD-10-CM | POA: Diagnosis not present

## 2021-05-09 DIAGNOSIS — E6609 Other obesity due to excess calories: Secondary | ICD-10-CM

## 2021-05-09 DIAGNOSIS — D151 Benign neoplasm of heart: Secondary | ICD-10-CM

## 2021-05-09 DIAGNOSIS — R7989 Other specified abnormal findings of blood chemistry: Secondary | ICD-10-CM

## 2021-05-09 DIAGNOSIS — E782 Mixed hyperlipidemia: Secondary | ICD-10-CM

## 2021-05-09 DIAGNOSIS — I10 Essential (primary) hypertension: Secondary | ICD-10-CM

## 2021-05-09 DIAGNOSIS — Z6834 Body mass index (BMI) 34.0-34.9, adult: Secondary | ICD-10-CM

## 2021-05-09 DIAGNOSIS — F329 Major depressive disorder, single episode, unspecified: Secondary | ICD-10-CM | POA: Diagnosis not present

## 2021-05-09 DIAGNOSIS — E538 Deficiency of other specified B group vitamins: Secondary | ICD-10-CM | POA: Diagnosis not present

## 2021-05-09 LAB — BAYER DCA HB A1C WAIVED: HB A1C (BAYER DCA - WAIVED): 5.8 % (ref <7.0)

## 2021-05-09 LAB — MICROALBUMIN, URINE WAIVED
Creatinine, Urine Waived: 200 mg/dL (ref 10–300)
Microalb, Ur Waived: 10 mg/L (ref 0–19)
Microalb/Creat Ratio: <30 mg/g (ref <30)

## 2021-05-09 MED ORDER — LOSARTAN POTASSIUM 50 MG PO TABS
50.0000 mg | ORAL_TABLET | Freq: Every day | ORAL | 4 refills | Status: DC
Start: 2021-05-09 — End: 2021-11-10

## 2021-05-09 MED ORDER — ROSUVASTATIN CALCIUM 10 MG PO TABS
ORAL_TABLET | ORAL | 4 refills | Status: DC
Start: 2021-05-09 — End: 2023-07-20

## 2021-05-09 MED ORDER — PAROXETINE HCL 40 MG PO TABS
20.0000 mg | ORAL_TABLET | Freq: Every day | ORAL | 4 refills | Status: DC
Start: 2021-05-09 — End: 2023-07-20

## 2021-05-09 NOTE — Assessment & Plan Note (Signed)
Noted on past labs, will recheck TSH and Free T4 today -- asymptomatic at this time, suspect subclinical hypothyroid and may benefit from medication in future, but she wishes to hold off.  Recent labs were normal.

## 2021-05-09 NOTE — Assessment & Plan Note (Signed)
Chronic, ongoing.  Continue Crestor 10 MG Monday/Wednesday/Friday schedule.  Obtain lipid panel today.  Return in 6 months. 

## 2021-05-09 NOTE — Patient Instructions (Signed)
Prediabetes Eating Plan Prediabetes is a condition that causes blood sugar (glucose) levels to be higher than normal. This increases the risk for developing type 2 diabetes (type 2 diabetes mellitus). Working with a health care provider or nutrition specialist (dietitian) to make diet and lifestyle changes can help prevent the onset of diabetes. These changes may help you:  Control your blood glucose levels.  Improve your cholesterol levels.  Manage your blood pressure. What are tips for following this plan? Reading food labels  Read food labels to check the amount of fat, salt (sodium), and sugar in prepackaged foods. Avoid foods that have: ? Saturated fats. ? Trans fats. ? Added sugars.  Avoid foods that have more than 300 milligrams (mg) of sodium per serving. Limit your sodium intake to less than 2,300 mg each day. Shopping  Avoid buying pre-made and processed foods.  Avoid buying drinks with added sugar. Cooking  Cook with olive oil. Do not use butter, lard, or ghee.  Bake, broil, grill, steam, or boil foods. Avoid frying. Meal planning  Work with your dietitian to create an eating plan that is right for you. This may include tracking how many calories you take in each day. Use a food diary, notebook, or mobile application to track what you eat at each meal.  Consider following a Mediterranean diet. This includes: ? Eating several servings of fresh fruits and vegetables each day. ? Eating fish at least twice a week. ? Eating one serving each day of whole grains, beans, nuts, and seeds. ? Using olive oil instead of other fats. ? Limiting alcohol. ? Limiting red meat. ? Using nonfat or low-fat dairy products.  Consider following a plant-based diet. This includes dietary choices that focus on eating mostly vegetables and fruit, grains, beans, nuts, and seeds.  If you have high blood pressure, you may need to limit your sodium intake or follow a diet such as the DASH  (Dietary Approaches to Stop Hypertension) eating plan. The DASH diet aims to lower high blood pressure.   Lifestyle  Set weight loss goals with help from your health care team. It is recommended that most people with prediabetes lose 7% of their body weight.  Exercise for at least 30 minutes 5 or more days a week.  Attend a support group or seek support from a mental health counselor.  Take over-the-counter and prescription medicines only as told by your health care provider. What foods are recommended? Fruits Berries. Bananas. Apples. Oranges. Grapes. Papaya. Mango. Pomegranate. Kiwi. Grapefruit. Cherries. Vegetables Lettuce. Spinach. Peas. Beets. Cauliflower. Cabbage. Broccoli. Carrots. Tomatoes. Squash. Eggplant. Herbs. Peppers. Onions. Cucumbers. Brussels sprouts. Grains Whole grains, such as whole-wheat or whole-grain breads, crackers, cereals, and pasta. Unsweetened oatmeal. Bulgur. Barley. Quinoa. Brown rice. Corn or whole-wheat flour tortillas or taco shells. Meats and other proteins Seafood. Poultry without skin. Lean cuts of pork and beef. Tofu. Eggs. Nuts. Beans. Dairy Low-fat or fat-free dairy products, such as yogurt, cottage cheese, and cheese. Beverages Water. Tea. Coffee. Sugar-free or diet soda. Seltzer water. Low-fat or nonfat milk. Milk alternatives, such as soy or almond milk. Fats and oils Olive oil. Canola oil. Sunflower oil. Grapeseed oil. Avocado. Walnuts. Sweets and desserts Sugar-free or low-fat pudding. Sugar-free or low-fat ice cream and other frozen treats. Seasonings and condiments Herbs. Sodium-free spices. Mustard. Relish. Low-salt, low-sugar ketchup. Low-salt, low-sugar barbecue sauce. Low-fat or fat-free mayonnaise. The items listed above may not be a complete list of recommended foods and beverages. Contact a dietitian for more   information. What foods are not recommended? Fruits Fruits canned with syrup. Vegetables Canned vegetables. Frozen  vegetables with butter or cream sauce. Grains Refined white flour and flour products, such as bread, pasta, snack foods, and cereals. Meats and other proteins Fatty cuts of meat. Poultry with skin. Breaded or fried meat. Processed meats. Dairy Full-fat yogurt, cheese, or milk. Beverages Sweetened drinks, such as iced tea and soda. Fats and oils Butter. Lard. Ghee. Sweets and desserts Baked goods, such as cake, cupcakes, pastries, cookies, and cheesecake. Seasonings and condiments Spice mixes with added salt. Ketchup. Barbecue sauce. Mayonnaise. The items listed above may not be a complete list of foods and beverages that are not recommended. Contact a dietitian for more information. Where to find more information  American Diabetes Association: www.diabetes.org Summary  You may need to make diet and lifestyle changes to help prevent the onset of diabetes. These changes can help you control blood sugar, improve cholesterol levels, and manage blood pressure.  Set weight loss goals with help from your health care team. It is recommended that most people with prediabetes lose 7% of their body weight.  Consider following a Mediterranean diet. This includes eating plenty of fresh fruits and vegetables, whole grains, beans, nuts, seeds, fish, and low-fat dairy, and using olive oil instead of other fats. This information is not intended to replace advice given to you by your health care provider. Make sure you discuss any questions you have with your health care provider. Document Revised: 03/07/2020 Document Reviewed: 03/07/2020 Elsevier Patient Education  2021 Elsevier Inc.  

## 2021-05-09 NOTE — Assessment & Plan Note (Signed)
Recommended eating smaller high protein, low fat meals more frequently and exercising 30 mins a day 5 times a week with a goal of 10-15lb weight loss in the next 3 months. Patient voiced their understanding and motivation to adhere to these recommendations.  

## 2021-05-09 NOTE — Assessment & Plan Note (Signed)
Chronic, stable.  Denies SI/HI.  Continue current medication regimen and adjust as needed.  Due to age would benefit from change to Zoloft or alternate SSRI in future and discontinuation Paxil, she is aware of this and wishes to maintain regimen at this time.  Follow-up in 6 months. 

## 2021-05-09 NOTE — Assessment & Plan Note (Signed)
History of in 2007, recent echo in hospital showing no return of this.  Continue collaboration with cardiology.

## 2021-05-09 NOTE — Assessment & Plan Note (Signed)
Chronic, stable with BP at goal today.  Continue Losartan 50 MG, she denies any edema at baseline -- could increase Losartan if BP elevations present.  Recommend continue to monitor BP daily at home and notify provider if consistent above 130/80.  Will check CMP and CBC today.  Recommend focus on DASH diet and modest weight loss.  Continue collaboration with cardiology, appreciate their input.  Return in 6 months.

## 2021-05-09 NOTE — Assessment & Plan Note (Signed)
Noted on past labs with level today improving at 5.8%, recommend focus on diet changes and regular activity.  If elevation consider starting medication.

## 2021-05-09 NOTE — Assessment & Plan Note (Signed)
Ongoing. Continue supplement and recheck B12 level today.  Adjust as needed. 

## 2021-05-09 NOTE — Progress Notes (Signed)
BP 126/67   Pulse 65   Temp 98.4 F (36.9 C) (Oral)   Wt 182 lb (82.6 kg)   LMP  (LMP Unknown)   SpO2 99%   BMI 34.39 kg/m    Subjective:    Patient ID: Lisa Bass, female    DOB: December 10, 1951, 70 y.o.   MRN: [id number]  HPI: Lisa Bass is a 70 y.o. female  Chief Complaint  Patient presents with  . Hypertension  . Hyperlipidemia  . Mood  . TSH   HYPERTENSION / HYPERLIPIDEMIA Currently taking Crestor 10 MG M/W/F schedule.  Continues on Losartan 50 MG daily.  Does have underlying B12 deficiency with last level one year ago == 290, recent was 745. Taking B12 1000 MCG daily.  Satisfied with current treatment? yes Duration of hypertension: chronic BP monitoring frequency: daily BP range: on average 130/80, sometimes a little higher BP medication side effects: no Duration of hyperlipidemia: chronic Cholesterol medication side effects: no Cholesterol supplements: none Medication compliance: good compliance Aspirin: yes Recent stressors: no Recurrent headaches: no Visual changes: no Palpitations: no Dyspnea: no Chest pain: no Lower extremity edema: no Dizzy/lightheaded: no   ELEVATED TSH In hospital on 09/21/20, TSH 4.645 -- TSH on recent labs 2.030 in November 2021. Fatigue: no Cold intolerance: no Heat intolerance: no Weight gain: no Weight loss: no Constipation: no Diarrhea/loose stools: no Palpitations: no Lower extremity edema: no Anxiety/depressed mood: no   PREDIABETES Last A1C was 6.3% in November 2021. Polydipsia/polyuria: no Visual disturbance: no Chest pain: no Paresthesias: no   DEPRESSION Continues on Paxil 40 MG daily. Mood status: stable Satisfied with current treatment?: yes Symptom severity: mild Duration of current treatment : chronic Side effects: no Medication compliance: good compliance Psychotherapy/counseling: none Depressed mood: no Anxious mood: no Anhedonia: no Significant weight loss or gain: no Insomnia:  none Fatigue: no Feelings of worthlessness or guilt: no Impaired concentration/indecisiveness: no Suicidal ideations: no Hopelessness: no Crying spells: no Depression screen [facility] 2/9 05/09/2021 11/08/2020 06/21/2020 05/08/2020 02/22/2019  Decreased Interest 0 0 0 1 0  Down, Depressed, Hopeless 0 0 0 0 0  PHQ - 2 Score 0 0 0 1 0  Altered sleeping 0 0 0 0 1  Tired, decreased energy 0 0 0 0 0  Change in appetite 2 0 0 0 1  Feeling bad or failure about yourself  0 0 0 0 1  Trouble concentrating 0 0 0 0 0  Moving slowly or fidgety/restless 0 0 0 0 1  Suicidal thoughts 0 0 0 0 0  PHQ-9 Score 2 0 0 1 4  Difficult doing work/chores Not difficult at all - Not difficult at all Not difficult at all Not difficult at all  Some recent data might be hidden    Relevant past medical, surgical, family and social history reviewed and updated as indicated. Interim medical history since our last visit reviewed. Allergies and medications reviewed and updated.  Review of Systems  Constitutional: Negative for activity change, appetite change, diaphoresis, fatigue and fever.  Respiratory: Negative for cough, chest tightness and shortness of breath.   Cardiovascular: Negative for chest pain, palpitations and leg swelling.  Gastrointestinal: Negative.   Neurological: Negative.   Psychiatric/Behavioral: Negative.     Per HPI unless specifically indicated above     Objective:    BP 126/67   Pulse 65   Temp 98.4 F (36.9 C) (Oral)   Wt 182 lb (82.6 kg)   LMP  (LMP Unknown)  SpO2 99%   BMI 34.39 kg/m   Wt Readings from Last 3 Encounters:  05/09/21 182 lb (82.6 kg)  11/08/20 185 lb 3.2 oz (84 kg)  09/21/20 189 lb (85.7 kg)    Physical Exam Vitals and nursing note reviewed.  Constitutional:      General: She is awake. She is not in acute distress.    Appearance: She is well-developed and well-groomed. She is obese. She is not ill-appearing.  HENT:     Head: Normocephalic.     Comments: Slight  diminished hair pattern scalp.    Right Ear: Hearing normal.     Left Ear: Hearing normal.  Eyes:     General: Lids are normal.        Right eye: No discharge.        Left eye: No discharge.     Conjunctiva/sclera: Conjunctivae normal.     Pupils: Pupils are equal, round, and reactive to light.  Neck:     Thyroid: No thyromegaly.     Vascular: No carotid bruit.  Cardiovascular:     Rate and Rhythm: Normal rate and regular rhythm.     Heart sounds: Normal heart sounds. No murmur heard. No gallop.   Pulmonary:     Effort: Pulmonary effort is normal. No accessory muscle usage or respiratory distress.     Breath sounds: Normal breath sounds.  Abdominal:     General: Bowel sounds are normal.     Palpations: Abdomen is soft.  Musculoskeletal:     Cervical back: Normal range of motion and neck supple.     Right lower leg: No edema.     Left lower leg: No edema.  Skin:    General: Skin is warm and dry.  Neurological:     Mental Status: She is alert and oriented to person, place, and time.  Psychiatric:        Attention and Perception: Attention normal.        Mood and Affect: Mood normal.        Speech: Speech normal.        Behavior: Behavior normal. Behavior is cooperative.        Thought Content: Thought content normal.    Results for orders placed or performed in visit on 11/08/20  T4, free  Result Value Ref Range   Free T4 0.96 0.82 - 1.77 ng/dL  TSH  Result Value Ref Range   TSH 2.030 0.450 - 4.500 uIU/mL  Basic metabolic panel  Result Value Ref Range   Glucose 89 65 - 99 mg/dL   BUN 10 8 - 27 mg/dL   Creatinine, Ser 1.01 (H) 0.57 - 1.00 mg/dL   GFR calc non Af Amer 57 (L) >59 mL/min/1.73   GFR calc Af Amer 65 >59 mL/min/1.73   BUN/Creatinine Ratio 10 (L) 12 - 28   Sodium 137 134 - 144 mmol/L   Potassium 4.7 3.5 - 5.2 mmol/L   Chloride 102 96 - 106 mmol/L   CO2 24 20 - 29 mmol/L   Calcium 9.8 8.7 - 10.3 mg/dL  Lipid Panel w/o Chol/HDL Ratio  Result Value Ref  Range   Cholesterol, Total 153 100 - 199 mg/dL   Triglycerides 152 (H) 0 - 149 mg/dL   HDL 53 >39 mg/dL   VLDL Cholesterol Cal 26 5 - 40 mg/dL   LDL Chol Calc (NIH) 74 0 - 99 mg/dL  HgB A1c  Result Value Ref Range   Hgb A1c MFr Bld 6.3 (  H) 4.8 - 5.6 %   Est. average glucose Bld gHb Est-mCnc 134 mg/dL  B12  Result Value Ref Range   Vitamin B-12 745 232 - 1,245 pg/mL      Assessment & Plan:   Problem List Items Addressed This Visit      Cardiovascular and Mediastinum   Hypertension    Chronic, stable with BP at goal today.  Continue Losartan 50 MG, she denies any edema at baseline -- could increase Losartan if BP elevations present.  Recommend continue to monitor BP daily at home and notify provider if consistent above 130/80.  Will check CMP and CBC today.  Recommend focus on DASH diet and modest weight loss.  Continue collaboration with cardiology, appreciate their input.  Return in 6 months.      Relevant Medications   losartan (COZAAR) 50 MG tablet   rosuvastatin (CRESTOR) 10 MG tablet   Myxoma of heart    History of in 2007, recent echo in hospital showing no return of this.  Continue collaboration with cardiology.      Relevant Medications   losartan (COZAAR) 50 MG tablet   rosuvastatin (CRESTOR) 10 MG tablet     Other   Hyperlipidemia    Chronic, ongoing.  Continue Crestor 10 MG Monday/Wednesday/Friday schedule.  Obtain lipid panel today.  Return in 6 months.      Relevant Medications   losartan (COZAAR) 50 MG tablet   rosuvastatin (CRESTOR) 10 MG tablet   Other Relevant Orders   Comprehensive metabolic panel   Lipid Panel w/o Chol/HDL Ratio   Major depression, chronic - Primary    Chronic, stable.  Denies SI/HI.  Continue current medication regimen and adjust as needed.  Due to age would benefit from change to Zoloft or alternate SSRI in future and discontinuation Paxil, she is aware of this and wishes to maintain regimen at this time.  Follow-up in 6 months.       Relevant Medications   PARoxetine (PAXIL) 40 MG tablet   B12 deficiency    Ongoing. Continue supplement and recheck B12 level today.  Adjust as needed.      Relevant Orders   CBC with Differential/Platelet   Vitamin B12   Elevated hemoglobin A1c    Noted on past labs with level today improving at 5.8%, recommend focus on diet changes and regular activity.  If elevation consider starting medication.      Relevant Orders   Bayer DCA Hb A1c Waived   Microalbumin, Urine Waived   Elevated TSH    Noted on past labs, will recheck TSH and Free T4 today -- asymptomatic at this time, suspect subclinical hypothyroid and may benefit from medication in future, but she wishes to hold off.  Recent labs were normal.      Relevant Orders   TSH   T4, free   Obesity    Recommended eating smaller high protein, low fat meals more frequently and exercising 30 mins a day 5 times a week with a goal of 10-15lb weight loss in the next 3 months. Patient voiced their understanding and motivation to adhere to these recommendations.           Follow up plan: Return in about 6 months (around 11/09/2021) for HTN/HLD, PREDIABETES, TSH CHECK, MOOD.

## 2021-05-10 ENCOUNTER — Other Ambulatory Visit: Payer: Self-pay | Admitting: Nurse Practitioner

## 2021-05-10 DIAGNOSIS — E782 Mixed hyperlipidemia: Secondary | ICD-10-CM

## 2021-05-10 LAB — COMPREHENSIVE METABOLIC PANEL
ALT: 17 IU/L (ref 0–32)
AST: 21 IU/L (ref 0–40)
Albumin/Globulin Ratio: 1.4 (ref 1.2–2.2)
Albumin: 4.1 g/dL (ref 3.8–4.8)
Alkaline Phosphatase: 107 IU/L (ref 44–121)
BUN/Creatinine Ratio: 10 — ABNORMAL LOW (ref 12–28)
BUN: 10 mg/dL (ref 8–27)
Bilirubin Total: 0.4 mg/dL (ref 0.0–1.2)
CO2: 24 mmol/L (ref 20–29)
Calcium: 9.7 mg/dL (ref 8.7–10.3)
Chloride: 101 mmol/L (ref 96–106)
Creatinine, Ser: 0.96 mg/dL (ref 0.57–1.00)
Globulin, Total: 3 g/dL (ref 1.5–4.5)
Glucose: 85 mg/dL (ref 65–99)
Potassium: 4.5 mmol/L (ref 3.5–5.2)
Sodium: 137 mmol/L (ref 134–144)
Total Protein: 7.1 g/dL (ref 6.0–8.5)
eGFR: 64 mL/min/{1.73_m2} (ref >59)

## 2021-05-10 LAB — VITAMIN B12: Vitamin B-12: 595 pg/mL (ref 232–1245)

## 2021-05-10 LAB — CBC WITH DIFFERENTIAL/PLATELET
Basophils Absolute: 0.1 10*3/uL (ref 0.0–0.2)
Basos: 1 %
EOS (ABSOLUTE): 0.3 10*3/uL (ref 0.0–0.4)
Eos: 3 %
Hematocrit: 38.8 % (ref 34.0–46.6)
Hemoglobin: 12.8 g/dL (ref 11.1–15.9)
Immature Grans (Abs): 0 10*3/uL (ref 0.0–0.1)
Immature Granulocytes: 0 %
Lymphocytes Absolute: 2.1 10*3/uL (ref 0.7–3.1)
Lymphs: 27 %
MCH: 27.9 pg (ref 26.6–33.0)
MCHC: 33 g/dL (ref 31.5–35.7)
MCV: 85 fL (ref 79–97)
Monocytes Absolute: 0.5 10*3/uL (ref 0.1–0.9)
Monocytes: 7 %
Neutrophils Absolute: 4.7 10*3/uL (ref 1.4–7.0)
Neutrophils: 62 %
Platelets: 234 10*3/uL (ref 150–450)
RBC: 4.58 x10E6/uL (ref 3.77–5.28)
RDW: 13.1 % (ref 11.7–15.4)
WBC: 7.7 10*3/uL (ref 3.4–10.8)

## 2021-05-10 LAB — LIPID PANEL W/O CHOL/HDL RATIO
Cholesterol, Total: 238 mg/dL — ABNORMAL HIGH (ref 100–199)
HDL: 48 mg/dL (ref >39)
LDL Chol Calc (NIH): 158 mg/dL — ABNORMAL HIGH (ref 0–99)
Triglycerides: 175 mg/dL — ABNORMAL HIGH (ref 0–149)
VLDL Cholesterol Cal: 32 mg/dL (ref 5–40)

## 2021-05-10 LAB — TSH: TSH: 1.63 u[IU]/mL (ref 0.450–4.500)

## 2021-05-10 LAB — T4, FREE: Free T4: 0.99 ng/dL (ref 0.82–1.77)

## 2021-05-10 NOTE — Progress Notes (Signed)
Contacted via [place] afternoon Lisa Bass, your labs have returned and overall they look great with exception of cholesterol levels which are more elevated this check.  Las check LDL, bad cholesterol, was in 70 range and this check in 150 range.  Are you taking your Rosuvastatin as ordered 3 days a week, please ensure you are for stroke prevention.  Were you fasting?  Please let me know and I would like you to come in 4 weeks to recheck this fasting.  Schedule a lab visit only.  Any questions? Keep being awesome!!  Thank you for allowing me to participate in your care.  I appreciate you. Kindest regards, Doristine Shehan

## 2021-06-13 ENCOUNTER — Other Ambulatory Visit: Payer: Medicare Other

## 2021-06-13 ENCOUNTER — Other Ambulatory Visit: Payer: Self-pay

## 2021-06-13 DIAGNOSIS — E782 Mixed hyperlipidemia: Secondary | ICD-10-CM

## 2021-06-14 LAB — LIPID PANEL W/O CHOL/HDL RATIO
Cholesterol, Total: 189 mg/dL (ref 100–199)
HDL: 45 mg/dL
LDL Chol Calc (NIH): 117 mg/dL — ABNORMAL HIGH (ref 0–99)
Triglycerides: 152 mg/dL — ABNORMAL HIGH (ref 0–149)
VLDL Cholesterol Cal: 27 mg/dL (ref 5–40)

## 2021-06-14 NOTE — Progress Notes (Signed)
Contacted via [place] morning Gennesis, your cholesterol levels have returned and show some improvement since last check with LDL 117 this check, last was 158.  However, the 117 is still above where I would like it for stroke prevention, which is less then 70.  Would you be okay with trying to increase your Rosuvastatin to 20 MG three days a week?  If so start taking two tablets and let me know, I will send in increased dose.  Any questions? Keep being awesome!!  Thank you for allowing me to participate in your care.  I appreciate you. Kindest regards, Madyn Ivins

## 2021-06-26 ENCOUNTER — Ambulatory Visit: Payer: Medicare Other

## 2021-06-30 ENCOUNTER — Ambulatory Visit: Payer: Medicare Other

## 2021-06-30 ENCOUNTER — Telehealth: Payer: Self-pay

## 2021-06-30 NOTE — Telephone Encounter (Signed)
This nurse called patient three times for scheduled telephonic AWV. Called at 0810, 0815, and 0825. Message left that we will call to reschedule for another time or she can call us.

## 2021-07-07 ENCOUNTER — Ambulatory Visit (INDEPENDENT_AMBULATORY_CARE_PROVIDER_SITE_OTHER): Payer: Medicare Other

## 2021-07-07 VITALS — BP 146/81 | HR 72 | Temp 97.4°F | Ht 62.0 in | Wt 188.4 lb

## 2021-07-07 DIAGNOSIS — Z0000 Encounter for general adult medical examination without abnormal findings: Secondary | ICD-10-CM | POA: Diagnosis not present

## 2021-07-07 NOTE — Progress Notes (Signed)
I connected with Lisa Bass today by telephone and verified that I am speaking with the correct person using two identifiers. Location patient: home Location provider: work Persons participating in the virtual visit: Kynadee, Dam LPN.   I discussed the limitations, risks, security and privacy concerns of performing an evaluation and management service by telephone and the availability of in person appointments. I also discussed with the patient that there may be a patient responsible charge related to this service. The patient expressed understanding and verbally consented to this telephonic visit.    Interactive audio and video telecommunications were attempted between this provider and patient, however failed, due to patient having technical difficulties OR patient did not have access to video capability.  We continued and completed visit with audio only.     Vital signs may be patient reported or missing.  Subjective:   Lisa Bass is a 70 y.o. female who presents for Medicare Annual (Subsequent) preventive examination.  Review of Systems     Cardiac Risk Factors include: advanced age (>14men, >19 women);dyslipidemia;hypertension;obesity (BMI >30kg/m2);sedentary lifestyle     Objective:    Today's Vitals   07/07/21 1256  BP: (!) 146/81  Pulse: 72  Temp: (!) 97.4 F (36.3 C)  Weight: 188 lb 6.4 oz (85.5 kg)  Height: 5\' 2"  (1.575 m)   Body mass index is 34.46 kg/m.  Advanced Directives 07/07/2021 09/21/2020 06/21/2020 05/14/2020 05/11/2020 04/28/2020 02/13/2019  Does Patient Have a Medical Advance Directive? No No No No No No No  Type of Advance Directive - - - - - - -  Would patient like information on creating a medical advance directive? - - No - Patient declined No - Patient declined No - Patient declined - Yes (MAU/Ambulatory/Procedural Areas - Information given)    Current Medications (verified) Outpatient Encounter Medications as of 07/07/2021   Medication Sig   aspirin 81 MG tablet Take 81 mg by mouth daily.   cholecalciferol (VITAMIN D3) 25 MCG (1000 UT) tablet Take 1,000 Units by mouth daily.   losartan (COZAAR) 50 MG tablet Take 1 tablet (50 mg total) by mouth daily.   Multiple Vitamin (MULTIVITAMIN) tablet Take 1 tablet by mouth daily.   PARoxetine (PAXIL) 40 MG tablet Take 0.5 tablets (20 mg total) by mouth daily. Take 1/2 dose   rosuvastatin (CRESTOR) 10 MG tablet Take 1 tablet (10 MG) by mouth on Monday, Wednesday, and Friday.   vitamin B-12 (CYANOCOBALAMIN) 1000 MCG tablet Take 1,000 mcg by mouth daily.   No facility-administered encounter medications on file as of 07/07/2021.    Allergies (verified) Penicillins, Biaxin [clarithromycin], and Lisinopril   History: Past Medical History:  Diagnosis Date   Allergy    Asthma    Cataract    CHF (congestive heart failure) (HCC)    Depression    Hyperlipidemia    Hypertension    OCD (obsessive compulsive disorder)    Palpitations    Perimenopause    Past Surgical History:  Procedure Laterality Date   CATARACT EXTRACTION, BILATERAL  02/2021   CHOLECYSTECTOMY     COLONOSCOPY WITH PROPOFOL N/A 09/07/2017   Procedure: COLONOSCOPY WITH PROPOFOL;  Surgeon: [name], MD;  Location: [facility] ENDOSCOPY;  Service: Endoscopy;  Laterality: N/A;   open heart surgery     ROOT CANAL     x2   Family History  Problem Relation Age of Onset   Hypertension Mother    Dementia Father    Hypertension Father    Diabetes  Father    Depression Father    GI problems Daughter    Heart disease Maternal Grandfather    Depression Sister    Social History   Socioeconomic History   Marital status: Married    Spouse name: Not on file   Number of children: Not on file   Years of education: Not on file   Highest education level: High school graduate  Occupational History   Occupation: retired   Tobacco Use   Smoking status: Never   Smokeless tobacco: Never  Vaping Use   Vaping  Use: Never used  Substance and Sexual Activity   Alcohol use: No    Alcohol/week: 0.0 standard drinks   Drug use: No   Sexual activity: Yes  Other Topics Concern   Not on file  Social History Narrative   Very active in church    Social Determinants of Health   Financial Resource Strain: Low Risk    Difficulty of Paying Living Expenses: Not hard at all  Food Insecurity: No Food Insecurity   Worried About Charity fundraiser in the Last Year: Never true   Arboriculturist in the Last Year: Never true  Transportation Needs: No Transportation Needs   Lack of Transportation (Medical): No   Lack of Transportation (Non-Medical): No  Physical Activity: Inactive   Days of Exercise per Week: 0 days   Minutes of Exercise per Session: 0 min  Stress: No Stress Concern Present   Feeling of Stress : Not at all  Social Connections: Not on file    Tobacco Counseling Counseling given: Not Answered   Clinical Intake:  Pre-visit preparation completed: Yes  Pain : No/denies pain     Nutritional Status: BMI > 30  Obese Nutritional Risks: None Diabetes: No  How often do you need to have someone help you when you read instructions, pamphlets, or other written materials from your doctor or pharmacy?: 1 - Never What is the last grade level you completed in school?: 12th grade  Diabetic? no  Interpreter Needed?: No  Information entered by :: NAllen LPN   Activities of Daily Living In your present state of health, do you have any difficulty performing the following activities: 07/07/2021  Hearing? N  Vision? N  Difficulty concentrating or making decisions? N  Walking or climbing stairs? Y  Dressing or bathing? N  Doing errands, shopping? N  Preparing Food and eating ? N  Using the Toilet? N  In the past six months, have you accidently leaked urine? N  Do you have problems with loss of bowel control? N  Managing your Medications? N  Managing your Finances? N  Housekeeping or  managing your Housekeeping? N  Some recent data might be hidden    Patient Care Team: Venita Lick, NP as PCP - General (Nurse Practitioner) [name], NP as PCP - Family Medicine (Nurse Practitioner) Margaretha Sheffield, MD (Otolaryngology) Yolonda Kida, MD as Consulting Physician (Cardiology)  Indicate any recent Medical Services you may have received from other than [facility] providers in the past year (date may be approximate).     Assessment:   This is a routine wellness examination for Dae.  Hearing/Vision screen Vision Screening - Comments:: Regular eye exams, Dr. Wyatt Portela  Dietary issues and exercise activities discussed: Current Exercise Habits: The patient does not participate in regular exercise at present   Goals Addressed             This Visit's Progress  Patient Stated       07/07/2021, wants to lose 20 pounds       Depression Screen PHQ 2/9 Scores 07/07/2021 05/09/2021 11/08/2020 06/21/2020 05/08/2020 02/22/2019 02/13/2019  PHQ - 2 Score 0 0 0 0 1 0 0  PHQ- 9 Score - 2 0 0 1 4 -    Fall Risk Fall Risk  07/07/2021 06/21/2020 03/23/2019 02/13/2019 02/11/2018  Falls in the past year? 0 1 0 0 No  Comment - passes out - - -  Number falls in past yr: - 1 - 0 -  Injury with Fall? - 1 - - -  Risk for fall due to : Medication side effect Medication side effect;History of fall(s) - - -  Follow up Falls evaluation completed;Education provided;Falls prevention discussed - Falls evaluation completed - -    FALL RISK PREVENTION PERTAINING TO THE HOME:  Any stairs in or around the home? No  If so, are there any without handrails?  N/a Home free of loose throw rugs in walkways, pet beds, electrical cords, etc? Yes  Adequate lighting in your home to reduce risk of falls? Yes   ASSISTIVE DEVICES UTILIZED TO PREVENT FALLS:  Life alert? No  Use of a cane, walker or w/c? No  Grab bars in the bathroom? Yes  Shower chair or bench in shower? No  Elevated toilet seat or a  handicapped toilet? No   TIMED UP AND GO:  Was the test performed? No .      Cognitive Function:     6CIT Screen 07/07/2021 06/21/2020 02/13/2019 02/11/2018  What Year? 0 points 0 points 0 points 0 points  What month? 0 points 0 points 0 points 0 points  What time? 0 points 0 points 0 points 0 points  Count back from 20 0 points 0 points 0 points 0 points  Months in reverse 0 points 0 points 0 points 0 points  Repeat phrase 2 points 0 points 0 points 0 points  Total Score 2 0 0 0    Immunizations Immunization History  Administered Date(s) Administered   Pneumococcal Conjugate-13 09/23/2015   Pneumococcal Polysaccharide-23 12/22/2016   Td 03/11/1999    TDAP status: Due, Education has been provided regarding the importance of this vaccine. Advised may receive this vaccine at local pharmacy or Health Dept. Aware to provide a copy of the vaccination record if obtained from local pharmacy or Health Dept. Verbalized acceptance and understanding.  Flu Vaccine status: Declined, Education has been provided regarding the importance of this vaccine but patient still declined. Advised may receive this vaccine at local pharmacy or Health Dept. Aware to provide a copy of the vaccination record if obtained from local pharmacy or Health Dept. Verbalized acceptance and understanding.  Pneumococcal vaccine status: Up to date  Covid-19 vaccine status: Declined, Education has been provided regarding the importance of this vaccine but patient still declined. Advised may receive this vaccine at local pharmacy or Health Dept.or vaccine clinic. Aware to provide a copy of the vaccination record if obtained from local pharmacy or Health Dept. Verbalized acceptance and understanding.  Qualifies for Shingles Vaccine? Yes   Zostavax completed No   Shingrix Completed?: No.    Education has been provided regarding the importance of this vaccine. Patient has been advised to call insurance company to determine out  of pocket expense if they have not yet received this vaccine. Advised may also receive vaccine at local pharmacy or Health Dept. Verbalized acceptance and understanding.  Screening Tests  Health Maintenance  Topic Date Due   COVID-19 Vaccine (1) Never done   Zoster Vaccines- Shingrix (1 of 2) Never done   MAMMOGRAM  05/09/2022 (Originally 08/21/2007)   TETANUS/TDAP  05/09/2022 (Originally 03/10/2009)   INFLUENZA VACCINE  07/21/2021   COLONOSCOPY (Pts 45-57yrs Insurance coverage will need to be confirmed)  09/08/2027   DEXA SCAN  Completed   Hepatitis C Screening  Completed   PNA vac Low Risk Adult  Completed   HPV VACCINES  Aged Out    Health Maintenance  Health Maintenance Due  Topic Date Due   COVID-19 Vaccine (1) Never done   Zoster Vaccines- Shingrix (1 of 2) Never done    Colorectal cancer screening: Type of screening: Colonoscopy. Completed 09/07/2017. Repeat every 10 years  Mammogram status: decline  Bone Density status: Completed 05/30/2019.   Lung Cancer Screening: (Low Dose CT Chest recommended if Age 18-80 years, 30 pack-year currently smoking OR have quit w/in 15years.) does not qualify.   Lung Cancer Screening Referral: no  Additional Screening:  Hepatitis C Screening: does qualify; Completed 09/23/2015  Vision Screening: Recommended annual ophthalmology exams for early detection of glaucoma and other disorders of the eye. Is the patient up to date with their annual eye exam?  Yes  Who is the provider or what is the name of the office in which the patient attends annual eye exams? Dr. Wyatt Portela If pt is not established with a provider, would they like to be referred to a provider to establish care? No .   Dental Screening: Recommended annual dental exams for proper oral hygiene  Community Resource Referral / Chronic Care Management: CRR required this visit?  No   CCM required this visit?  No      Plan:     I have personally reviewed and noted the following in  the patient's chart:   Medical and social history Use of alcohol, tobacco or illicit drugs  Current medications and supplements including opioid prescriptions.  Functional ability and status Nutritional status Physical activity Advanced directives List of other physicians Hospitalizations, surgeries, and ER visits in previous 12 months Vitals Screenings to include cognitive, depression, and falls Referrals and appointments  In addition, I have reviewed and discussed with patient certain preventive protocols, quality metrics, and best practice recommendations. A written personalized care plan for preventive services as well as general preventive health recommendations were provided to patient.     Kellie Simmering, LPN   08/14/538   Nurse Notes:

## 2021-07-07 NOTE — Patient Instructions (Signed)
Lisa Bass , Thank you for taking time to come for your Medicare Wellness Visit. I appreciate your ongoing commitment to your health goals. Please review the following plan we discussed and let me know if I can assist you in the future.   Screening recommendations/referrals: Colonoscopy: completed 09/07/2017 Mammogram: decline Bone Density: completed 05/30/2019 Recommended yearly ophthalmology/optometry visit for glaucoma screening and checkup Recommended yearly dental visit for hygiene and checkup  Vaccinations: Influenza vaccine: decline Pneumococcal vaccine: completed Tdap vaccine: due Shingles vaccine: discussed   Covid-19: decline  Advanced directives: Advance directive discussed with you today.   Conditions/risks identified: none  Next appointment: Follow up in one year for your annual wellness visit    Preventive Care 65 Years and Older, Female Preventive care refers to lifestyle choices and visits with your health care provider that can promote health and wellness. What does preventive care include? A yearly physical exam. This is also called an annual well check. Dental exams once or twice a year. Routine eye exams. Ask your health care provider how often you should have your eyes checked. Personal lifestyle choices, including: Daily care of your teeth and gums. Regular physical activity. Eating a healthy diet. Avoiding tobacco and drug use. Limiting alcohol use. Practicing safe sex. Taking low-dose aspirin every day. Taking vitamin and mineral supplements as recommended by your health care provider. What happens during an annual well check? The services and screenings done by your health care provider during your annual well check will depend on your age, overall health, lifestyle risk factors, and family history of disease. Counseling  Your health care provider may ask you questions about your: Alcohol use. Tobacco use. Drug use. Emotional well-being. Home and  relationship well-being. Sexual activity. Eating habits. History of falls. Memory and ability to understand (cognition). Work and work Statistician. Reproductive health. Screening  You may have the following tests or measurements: Height, weight, and BMI. Blood pressure. Lipid and cholesterol levels. These may be checked every 5 years, or more frequently if you are over 30 years old. Skin check. Lung cancer screening. You may have this screening every year starting at age 70 if you have a 30-pack-year history of smoking and currently smoke or have quit within the past 15 years. Fecal occult blood test (FOBT) of the stool. You may have this test every year starting at age 60. Flexible sigmoidoscopy or colonoscopy. You may have a sigmoidoscopy every 5 years or a colonoscopy every 10 years starting at age 66. Hepatitis C blood test. Hepatitis B blood test. Sexually transmitted disease (STD) testing. Diabetes screening. This is done by checking your blood sugar (glucose) after you have not eaten for a while (fasting). You may have this done every 1-3 years. Bone density scan. This is done to screen for osteoporosis. You may have this done starting at age 38. Mammogram. This may be done every 1-2 years. Talk to your health care provider about how often you should have regular mammograms. Talk with your health care provider about your test results, treatment options, and if necessary, the need for more tests. Vaccines  Your health care provider may recommend certain vaccines, such as: Influenza vaccine. This is recommended every year. Tetanus, diphtheria, and acellular pertussis (Tdap, Td) vaccine. You may need a Td booster every 10 years. Zoster vaccine. You may need this after age 34. Pneumococcal 13-valent conjugate (PCV13) vaccine. One dose is recommended after age 23. Pneumococcal polysaccharide (PPSV23) vaccine. One dose is recommended after age 30. Talk to your  health care provider  about which screenings and vaccines you need and how often you need them. This information is not intended to replace advice given to you by your health care provider. Make sure you discuss any questions you have with your health care provider. Document Released: 01/03/2016 Document Revised: 08/26/2016 Document Reviewed: 10/08/2015 Elsevier Interactive Patient Education  2017 [place] Prevention in the Home Falls can cause injuries. They can happen to people of all ages. There are many things you can do to make your home safe and to help prevent falls. What can I do on the outside of my home? Regularly fix the edges of walkways and driveways and fix any cracks. Remove anything that might make you trip as you walk through a door, such as a raised step or threshold. Trim any bushes or trees on the path to your home. Use bright outdoor lighting. Clear any walking paths of anything that might make someone trip, such as rocks or tools. Regularly check to see if handrails are loose or broken. Make sure that both sides of any steps have handrails. Any raised decks and porches should have guardrails on the edges. Have any leaves, snow, or ice cleared regularly. Use sand or salt on walking paths during winter. Clean up any spills in your garage right away. This includes oil or grease spills. What can I do in the bathroom? Use night lights. Install grab bars by the toilet and in the tub and shower. Do not use towel bars as grab bars. Use non-skid mats or decals in the tub or shower. If you need to sit down in the shower, use a plastic, non-slip stool. Keep the floor dry. Clean up any water that spills on the floor as soon as it happens. Remove soap buildup in the tub or shower regularly. Attach bath mats securely with double-sided non-slip rug tape. Do not have throw rugs and other things on the floor that can make you trip. What can I do in the bedroom? Use night lights. Make sure  that you have a light by your bed that is easy to reach. Do not use any sheets or blankets that are too big for your bed. They should not hang down onto the floor. Have a firm chair that has side arms. You can use this for support while you get dressed. Do not have throw rugs and other things on the floor that can make you trip. What can I do in the kitchen? Clean up any spills right away. Avoid walking on wet floors. Keep items that you use a lot in easy-to-reach places. If you need to reach something above you, use a strong step stool that has a grab bar. Keep electrical cords out of the way. Do not use floor polish or wax that makes floors slippery. If you must use wax, use non-skid floor wax. Do not have throw rugs and other things on the floor that can make you trip. What can I do with my stairs? Do not leave any items on the stairs. Make sure that there are handrails on both sides of the stairs and use them. Fix handrails that are broken or loose. Make sure that handrails are as long as the stairways. Check any carpeting to make sure that it is firmly attached to the stairs. Fix any carpet that is loose or worn. Avoid having throw rugs at the top or bottom of the stairs. If you do have throw rugs, attach them  to the floor with carpet tape. Make sure that you have a light switch at the top of the stairs and the bottom of the stairs. If you do not have them, ask someone to add them for you. What else can I do to help prevent falls? Wear shoes that: Do not have high heels. Have rubber bottoms. Are comfortable and fit you well. Are closed at the toe. Do not wear sandals. If you use a stepladder: Make sure that it is fully opened. Do not climb a closed stepladder. Make sure that both sides of the stepladder are locked into place. Ask someone to hold it for you, if possible. Clearly mark and make sure that you can see: Any grab bars or handrails. First and last steps. Where the edge of  each step is. Use tools that help you move around (mobility aids) if they are needed. These include: Canes. Walkers. Scooters. Crutches. Turn on the lights when you go into a dark area. Replace any light bulbs as soon as they burn out. Set up your furniture so you have a clear path. Avoid moving your furniture around. If any of your floors are uneven, fix them. If there are any pets around you, be aware of where they are. Review your medicines with your doctor. Some medicines can make you feel dizzy. This can increase your chance of falling. Ask your doctor what other things that you can do to help prevent falls. This information is not intended to replace advice given to you by your health care provider. Make sure you discuss any questions you have with your health care provider. Document Released: 10/03/2009 Document Revised: 05/14/2016 Document Reviewed: 01/11/2015 Elsevier Interactive Patient Education  2017 [employer].

## 2021-11-09 DIAGNOSIS — M85852 Other specified disorders of bone density and structure, left thigh: Secondary | ICD-10-CM | POA: Insufficient documentation

## 2021-11-10 ENCOUNTER — Encounter: Payer: Self-pay | Admitting: Nurse Practitioner

## 2021-11-10 ENCOUNTER — Other Ambulatory Visit: Payer: Self-pay

## 2021-11-10 ENCOUNTER — Ambulatory Visit (INDEPENDENT_AMBULATORY_CARE_PROVIDER_SITE_OTHER): Payer: Medicare Other | Admitting: Nurse Practitioner

## 2021-11-10 VITALS — BP 145/72 | HR 68 | Wt 184.4 lb

## 2021-11-10 DIAGNOSIS — R7309 Other abnormal glucose: Secondary | ICD-10-CM

## 2021-11-10 DIAGNOSIS — E66811 Obesity, class 1: Secondary | ICD-10-CM

## 2021-11-10 DIAGNOSIS — E538 Deficiency of other specified B group vitamins: Secondary | ICD-10-CM | POA: Diagnosis not present

## 2021-11-10 DIAGNOSIS — E782 Mixed hyperlipidemia: Secondary | ICD-10-CM

## 2021-11-10 DIAGNOSIS — M85852 Other specified disorders of bone density and structure, left thigh: Secondary | ICD-10-CM

## 2021-11-10 DIAGNOSIS — I10 Essential (primary) hypertension: Secondary | ICD-10-CM

## 2021-11-10 DIAGNOSIS — E6609 Other obesity due to excess calories: Secondary | ICD-10-CM

## 2021-11-10 DIAGNOSIS — D151 Benign neoplasm of heart: Secondary | ICD-10-CM

## 2021-11-10 DIAGNOSIS — Z6833 Body mass index (BMI) 33.0-33.9, adult: Secondary | ICD-10-CM

## 2021-11-10 DIAGNOSIS — R7989 Other specified abnormal findings of blood chemistry: Secondary | ICD-10-CM

## 2021-11-10 DIAGNOSIS — F329 Major depressive disorder, single episode, unspecified: Secondary | ICD-10-CM

## 2021-11-10 LAB — BAYER DCA HB A1C WAIVED: HB A1C (BAYER DCA - WAIVED): 5.7 % — ABNORMAL HIGH (ref 4.8–5.6)

## 2021-11-10 IMAGING — DX DG CHEST 1V PORT
1 series · 1 of 1 positions shown · non-contrast
Comparison: Chest CT 12/03/2007, chest radiograph 12/02/2007

CLINICAL DATA: Cough. Additional history provided: New onset nausea
today, near syncope, productive cough for 2 days, history of asthma
and CHF.

EXAM:
PORTABLE CHEST 1 VIEW

[chest ap]
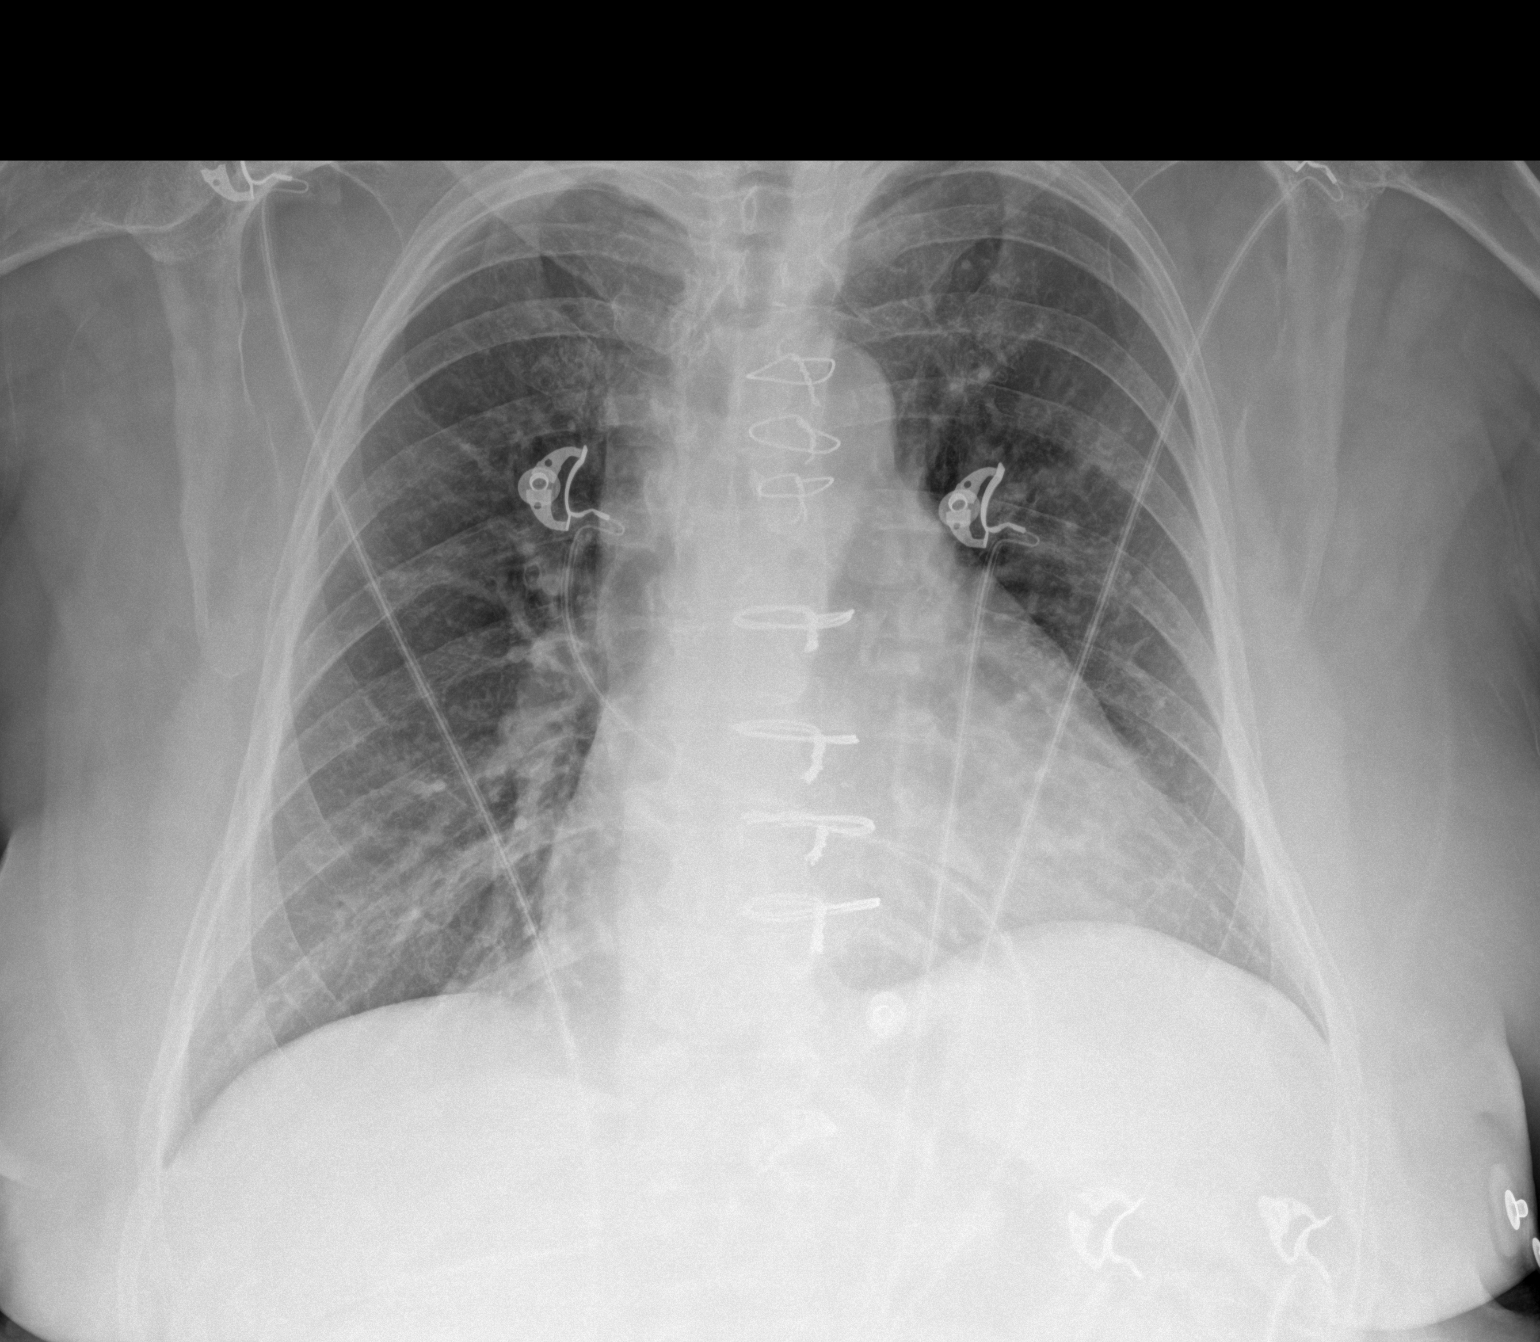

[1 of 1 positions shown; findings below may reference images not displayed]

FINDINGS: Prior median sternotomy. Unchanged cardiomegaly. There is central
pulmonary vascular congestion. There are subtle interstitial
opacities at the right lung base. The left lung is clear. No
evidence of pleural effusion or pneumothorax. No acute bony
abnormality identified.
IMPRESSION: Cardiomegaly with central pulmonary vascular congestion.

Subtle interstitial opacities at the right lung base which may
reflect atelectasis or interstitial edema. Atypical/viral pneumonia
is difficult to exclude.

## 2021-11-10 IMAGING — CT CT ABD-PELV W/ CM
2 of 5 series · 17 of 46 positions shown, 19 images · IV contrast (APPLIED)
Comparison: April 01, 2014

CLINICAL DATA: Nausea and vomiting.

EXAM:
CT ABDOMEN AND PELVIS WITH CONTRAST
TECHNIQUE: Multidetector CT imaging of the abdomen and pelvis was performed
using the standard protocol following bolus administration of
intravenous contrast.
CONTRAST:  100mL OMNIPAQUE IOHEXOL 300 MG/ML  SOLN

[Series 2: routine abd/pel with · axial · 0.68mm/px · z∈[-383,+17]mm · 14 of 90 slices shown, 16 images]
[im 5/90  soft-tissue]
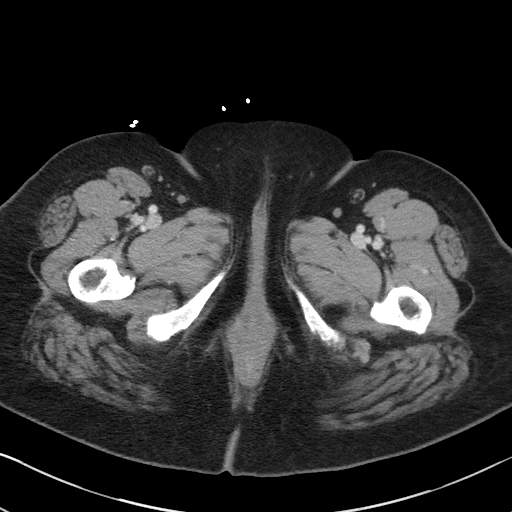
[im 5/90  bone]
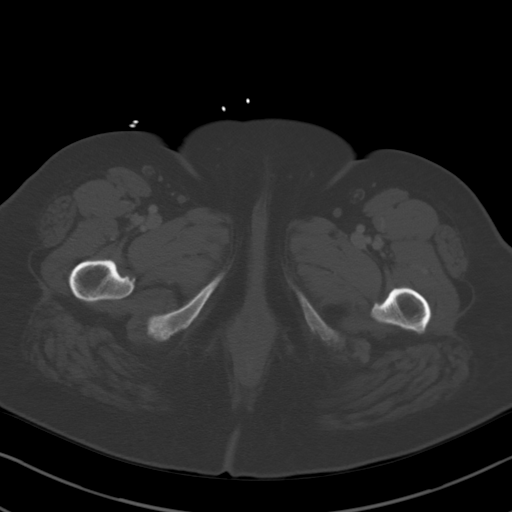
[im 10/90  soft-tissue]
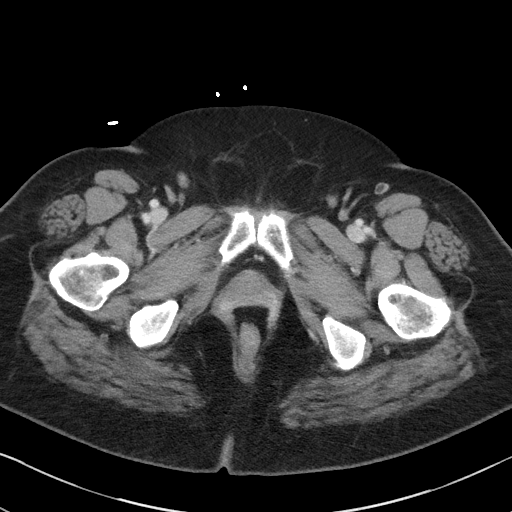
[im 20/90  soft-tissue]
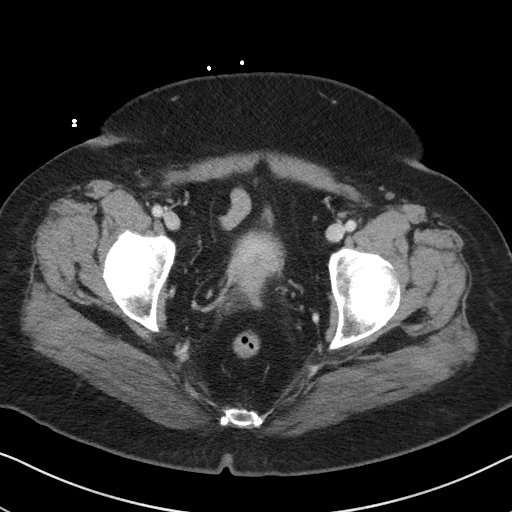
[im 25/90  soft-tissue]
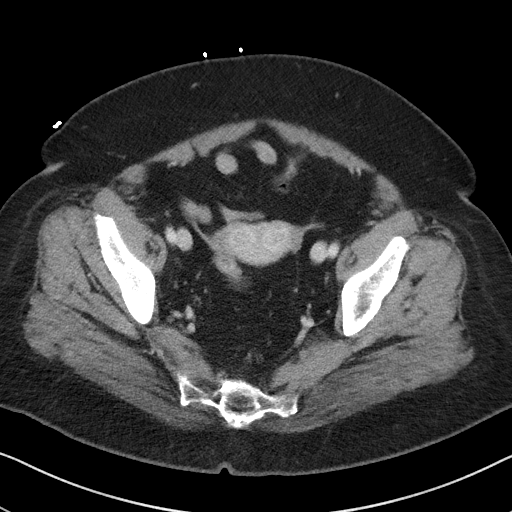
[im 30/90  soft-tissue]
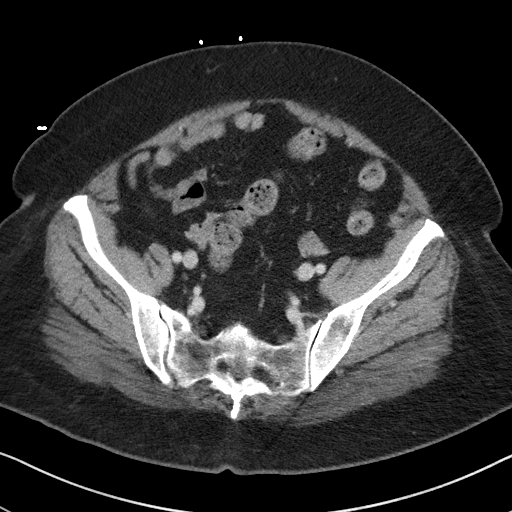
[im 35/90  soft-tissue]
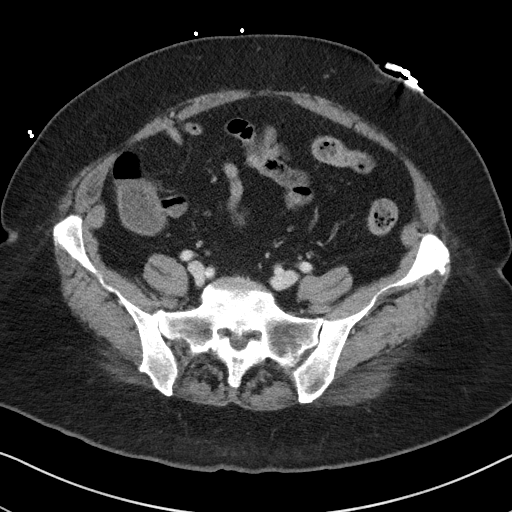
[im 40/90  soft-tissue]
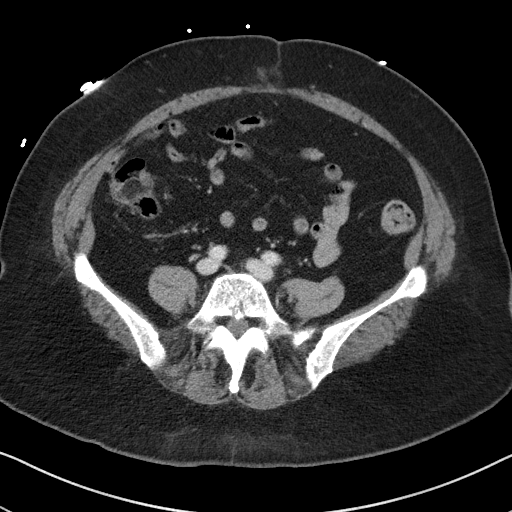
[im 50/90  soft-tissue]
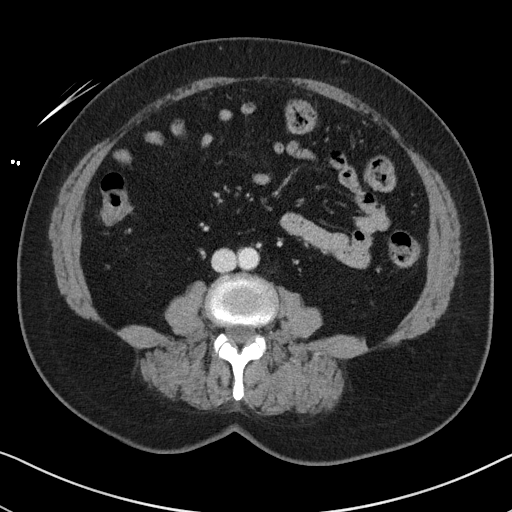
[im 55/90  soft-tissue]
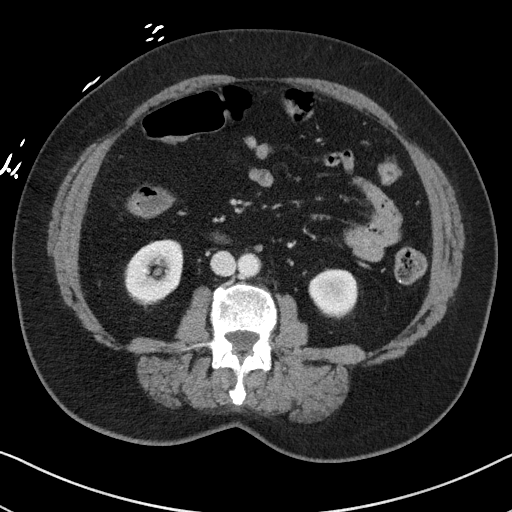
[im 55/90  bone]
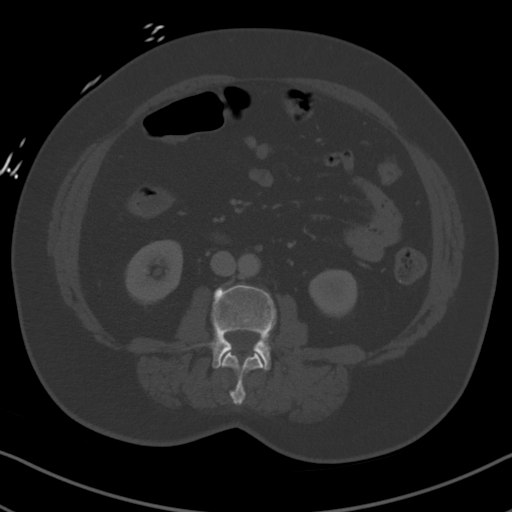
[im 60/90  soft-tissue]
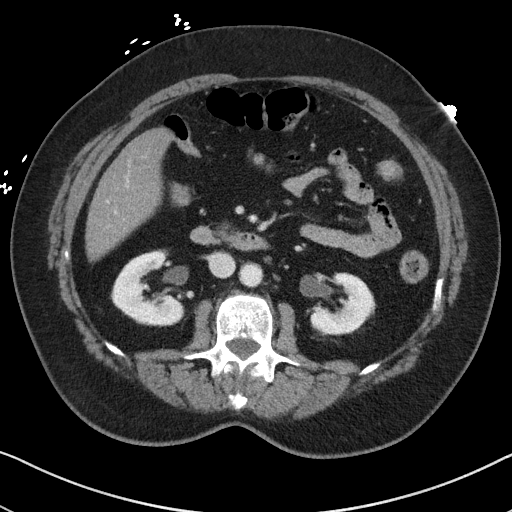
[im 65/90  soft-tissue]
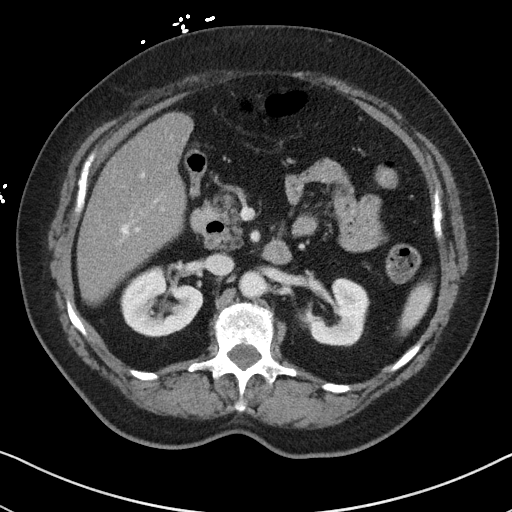
[im 70/90  soft-tissue]
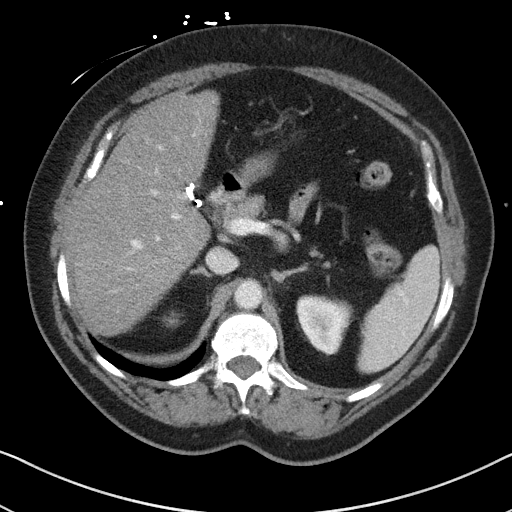
[im 80/90  soft-tissue]
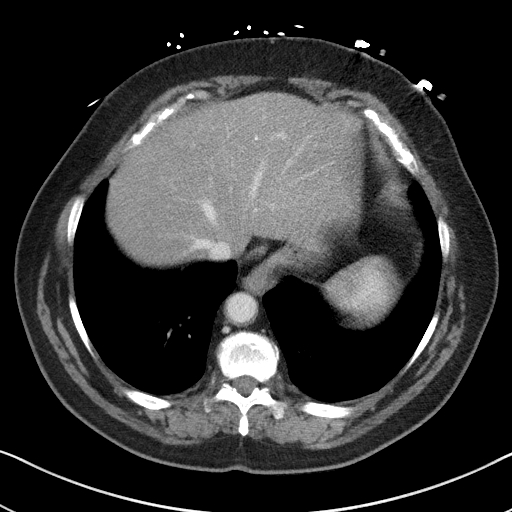
[im 85/90  soft-tissue]
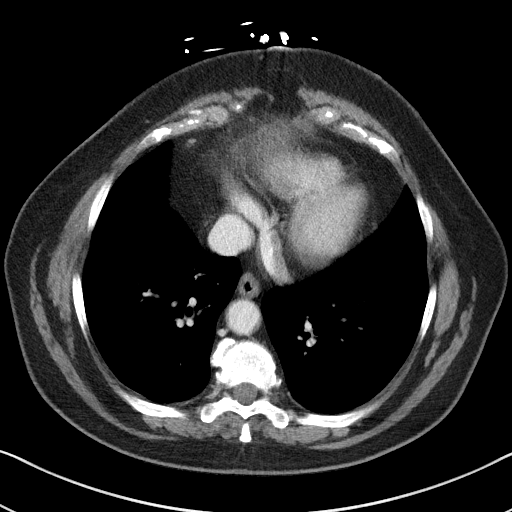

[Series 4: coronal st · coronal · 0.84mm/px · 3 of 104 slices shown]
[im 35/104  soft-tissue]
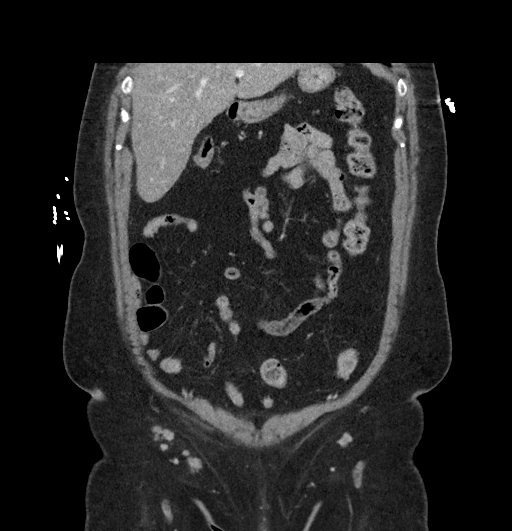
[im 46/104  soft-tissue]
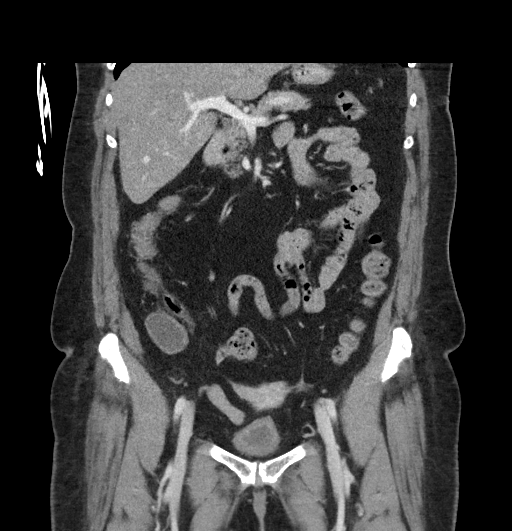
[im 58/104  soft-tissue]
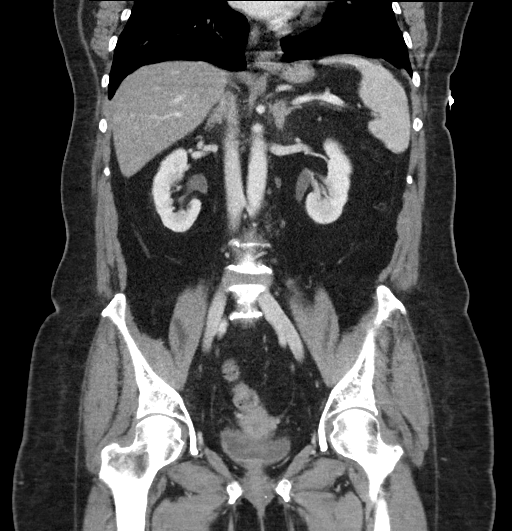

[17 of 46 positions shown; findings below may reference images not displayed]

FINDINGS: Lower chest: No acute abnormality.

Hepatobiliary: There is mild diffuse fatty infiltration of the liver
parenchyma. No focal liver abnormality is seen. Status post
cholecystectomy. No biliary dilatation.

Pancreas: Unremarkable. No pancreatic ductal dilatation or
surrounding inflammatory changes.

Spleen: Normal in size without focal abnormality.

Adrenals/Urinary Tract: Adrenal glands are unremarkable. Kidneys are
normal, without renal calculi, focal lesion, or hydronephrosis.
Bladder is unremarkable.

Stomach/Bowel: Stomach is within normal limits. Appendix appears
normal. No evidence of bowel wall thickening, distention, or
inflammatory changes.

Vascular/Lymphatic: No significant vascular findings are present. No
enlarged abdominal or pelvic lymph nodes.

Reproductive: Uterus and bilateral adnexa are unremarkable.

Other: No abdominal wall hernia or abnormality. No abdominopelvic
ascites.

Musculoskeletal: No acute or significant osseous findings.
IMPRESSION: 1. Fatty liver.
2. Status post cholecystectomy.

## 2021-11-10 MED ORDER — LOSARTAN POTASSIUM 100 MG PO TABS: 100.0000 mg | ORAL_TABLET | Freq: Every day | ORAL | 4 refills | Status: DC

## 2021-11-10 NOTE — Assessment & Plan Note (Signed)
Noted on past labs, will recheck TSH and Free T4 next visit as recent have been stable -- asymptomatic at this time, suspect subclinical hypothyroid and may benefit from medication in future, but she wishes to hold off.  Recent labs were normal.

## 2021-11-10 NOTE — Assessment & Plan Note (Signed)
Chronic, ongoing with BP above goal today and above goal on home readings at times.  Increased Losartan to 100 MG daily (educated her on this change) and monitor K+, last 4.5.  Recommend continue to monitor BP daily at home and notify provider if consistent above 130/80.  Will check BMP today.  Recommend focus on DASH diet and modest weight loss.  Continue collaboration with cardiology, appreciate their input.  Return in 6 weeks.

## 2021-11-10 NOTE — Assessment & Plan Note (Signed)
Noted on past labs with level today improving at 5.7%, recommend continued focus on diet changes and regular activity.  If elevation consider starting medication.

## 2021-11-10 NOTE — Assessment & Plan Note (Signed)
Chronic, stable.  Denies SI/HI.  Continue current medication regimen and adjust as needed.  Due to age would benefit from change to Zoloft or alternate SSRI in future and discontinuation Paxil, she is aware of this and wishes to maintain regimen at this time.

## 2021-11-10 NOTE — Assessment & Plan Note (Signed)
Noted on DEXA June 2020.  Recommend she continue daily Vitamin D3 supplement and adequate calcium intake at home.  Check Vit D level today.  Repeat DEXA June 2025.

## 2021-11-10 NOTE — Patient Instructions (Addendum)
Start taking two of your 50 MG Losartan tablets to equal 100 MG daily and I will send in a 100 MG tablet to start once you have completed your 50 MG tablets.  Osteopenia Osteopenia is a loss of thickness (density) inside the bones. Another name for osteopenia is low bone mass. Mild osteopenia is a normal part of aging. It is not a disease, and it does not cause symptoms. However, if you have osteopenia and continue to lose bone mass, you could develop a condition that causes the bones to become thin and break more easily (osteoporosis). Osteoporosis can cause you to lose some height, have back pain, and have a stooped posture. Although osteopenia is not a disease, making changes to your lifestyle and diet can help to prevent osteopenia from developing into osteoporosis. What are the causes? Osteopenia is caused by loss of calcium in the bones. Bones are constantly changing. Old bone cells are continually being replaced with new bone cells. This process builds new bone. The mineral calcium is needed to build new bone and maintain bone density. Bone density is usually highest around age 32. After that, most people's bodies cannot replace all the bone they have lost with new bone. What increases the risk? You are more likely to develop this condition if: You are older than age 57. You are a woman who went through menopause early. You have a long illness that keeps you in bed. You do not get enough exercise. You lack certain nutrients (malnutrition). You have an overactive thyroid gland (hyperthyroidism). You use products that contain nicotine or tobacco, such as cigarettes, e-cigarettes and chewing tobacco, or you drink a lot of alcohol. You are taking medicines that weaken the bones, such as steroids. What are the signs or symptoms? This condition does not cause any symptoms. You may have a slightly higher risk for bone breaks (fractures), so getting fractures more easily than normal may be an  indication of osteopenia. How is this diagnosed? This condition may be diagnosed based on an X-ray exam that measures bone density (dual-energy X-ray absorptiometry, or DEXA). This test can measure bone density in your hips, spine, and wrists. Osteopenia has no symptoms, so this condition is usually diagnosed after a routine bone density screening test is done for osteoporosis. This routine screening is usually done for: Women who are age 71 or older. Men who are age 11 or older. If you have risk factors for osteopenia, you may have the screening test at an earlier age. How is this treated? Making dietary and lifestyle changes can lower your risk for osteoporosis. If you have severe osteopenia that is close to becoming osteoporosis, this condition can be treated with medicines and dietary supplements such as calcium and vitamin D. These supplements help to rebuild bone density. Follow these instructions at home: Eating and drinking Eat a diet that is high in calcium and vitamin D. Calcium is found in dairy products, beans, salmon, and leafy green vegetables like spinach and broccoli. Look for foods that have vitamin D and calcium added to them (fortified foods), such as orange juice, cereal, and bread.  Lifestyle Do 30 minutes or more of a weight-bearing exercise every day, such as walking, jogging, or playing a sport. These types of exercises strengthen the bones. Do not use any products that contain nicotine or tobacco, such as cigarettes, e-cigarettes, and chewing tobacco. If you need help quitting, ask your health care provider. Do not drink alcohol if: Your health care provider tells  you not to drink. You are pregnant, may be pregnant, or are planning to become pregnant. If you drink alcohol: Limit how much you use to: 0-1 drink a day for women. 0-2 drinks a day for men. Be aware of how much alcohol is in your drink. In the [place] one drink equals one 12 oz bottle of beer (355 mL), one  5 oz glass of wine (148 mL), or one 1 oz glass of hard liquor (44 mL). General instructions Take over-the-counter and prescription medicines only as told by your health care provider. These include vitamins and supplements. Take precautions at home to lower your risk of falling, such as: Keeping rooms well-lit and free of clutter, such as cords. Installing safety rails on stairs. Using rubber mats in the bathroom or other areas that are often wet or slippery. Keep all follow-up visits. This is important. Contact a health care provider if: You have not had a bone density screening for osteoporosis and you are: A woman who is age 95 or older. A man who is age 48 or older. You are a postmenopausal woman who has not had a bone density screening for osteoporosis. You are older than age 67 and you want to know if you should have bone density screening for osteoporosis. Summary Osteopenia is a loss of thickness (density) inside the bones. Another name for osteopenia is low bone mass. Osteopenia is not a disease, but it may increase your risk for a condition that causes the bones to become thin and break more easily (osteoporosis). You may be at risk for osteopenia if you are older than age 54 or if you are a woman who went through early menopause. Osteopenia does not cause any symptoms, but it can be diagnosed with a bone density screening test. Dietary and lifestyle changes are the first treatment for osteopenia. These may lower your risk for osteoporosis. This information is not intended to replace advice given to you by your health care provider. Make sure you discuss any questions you have with your health care provider. Document Revised: 05/23/2020 Document Reviewed: 05/23/2020 Elsevier Patient Education  [place].

## 2021-11-10 NOTE — Assessment & Plan Note (Signed)
BMI 33.73 today with 4 pounds weight loss.  Recommended eating smaller high protein, low fat meals more frequently and exercising 30 mins a day 5 times a week with a goal of 10-15lb weight loss in the next 3 months. Patient voiced their understanding and motivation to adhere to these recommendations.

## 2021-11-10 NOTE — Assessment & Plan Note (Signed)
Chronic, ongoing.  Continue Crestor 10 MG Monday/Wednesday/Friday schedule.  Obtain lipid panel today.

## 2021-11-10 NOTE — Assessment & Plan Note (Signed)
History of in 2007, recent echo in hospital showing no return of this.  Continue collaboration with cardiology.

## 2021-11-10 NOTE — Progress Notes (Signed)
BP (!) 145/72   Pulse 68   Wt 184 lb 6.6 oz (83.6 kg)   LMP  (LMP Unknown)   SpO2 97%   BMI 33.73 kg/m    Subjective:    Patient ID: Lisa Bass, female    DOB: [birth year], 70 y.o.   MRN: [id number]  HPI: Lisa Bass is a 70 y.o. female  Chief Complaint  Patient presents with   Hyperlipidemia   Hypertension   TSH Check   Prediabetes   HYPERTENSION / HYPERLIPIDEMIA Continues on Losartan 50 MG daily.  Is taking Crestor 10 MG M/W/F schedule.  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  Has underlying B12 deficiency, last level stable. Taking B12 1000 MCG daily.  Satisfied with current treatment? yes Duration of hypertension: chronic BP monitoring frequency: occasionally BP range: occasional >130/80 BP medication side effects: no Duration of hyperlipidemia: chronic Cholesterol medication side effects: no Cholesterol supplements: none Medication compliance: good compliance Aspirin: yes Recent stressors: no Recurrent headaches: no Visual changes: no Palpitations: no Dyspnea: no Chest pain: no Lower extremity edema: no Dizzy/lightheaded: no   ELEVATED TSH Recent levels have been within normal range, history of elevation. Fatigue: no Cold intolerance: no Heat intolerance: no Weight gain: no Weight loss: no Constipation: no Diarrhea/loose stools: no Palpitations: no Lower extremity edema: no Anxiety/depressed mood: no   OSTEOPENIA Last DEXA on 05/30/2019 = The BMD measured at Femur Neck Left is 0.809 g/cm2 with a T-score of -1.6.  Satisfied with current treatment?: yes Past osteoporosis medications/treatments: supplements Adequate calcium & vitamin D: yes Weight bearing exercises: yes   PREDIABETES Last A1C was 5.8%, trending down. Polydipsia/polyuria: no Visual disturbance: no Chest pain: no Paresthesias: no   DEPRESSION Continues on Paxil 40 MG daily.  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  They are 12 and 14.   Mood status: stable Satisfied with current treatment?: yes Symptom severity: mild Duration of current treatment : chronic Side effects: no Medication compliance: good compliance Psychotherapy/counseling: none Depressed mood: no Anxious mood: no Anhedonia: no Significant weight loss or gain: no Insomnia: none Fatigue: no Feelings of worthlessness or guilt: no Impaired concentration/indecisiveness: no Suicidal ideations: no Hopelessness: no Crying spells: no Depression screen [facility] 2/9 11/10/2021 07/07/2021 05/09/2021 11/08/2020 06/21/2020  Decreased Interest 0 0 0 0 0  Down, Depressed, Hopeless 0 0 0 0 0  PHQ - 2 Score 0 0 0 0 0  Altered sleeping 1 - 0 0 0  Tired, decreased energy 0 - 0 0 0  Change in appetite 0 - 2 0 0  Feeling bad or failure about yourself  0 - 0 0 0  Trouble concentrating 0 - 0 0 0  Moving slowly or fidgety/restless 0 - 0 0 0  Suicidal thoughts 0 - 0 0 0  PHQ-9 Score 1 - 2 0 0  Difficult doing work/chores Not difficult at all - Not difficult at all - Not difficult at all  Some recent data might be hidden   GAD 7 : Generalized Anxiety Score 11/10/2021 02/22/2019  Nervous, Anxious, on Edge 1 1  Control/stop worrying 0 0  Worry too much - different things 0 1  Trouble relaxing 1 0  Restless 0 1  Easily annoyed or irritable 1 0  Afraid - awful might happen 0 1  Total GAD 7 Score 3 4  Anxiety Difficulty Not difficult at all Not  difficult at all   Relevant past medical, surgical, family and social history reviewed and updated as indicated. Interim medical history since our last visit reviewed. Allergies and medications reviewed and updated.  Review of Systems  Constitutional:  Negative for activity change, appetite change, diaphoresis, fatigue and fever.  Respiratory:  Negative for cough, chest tightness and shortness of breath.   Cardiovascular:  Negative for chest pain, palpitations and leg swelling.   Gastrointestinal: Negative.   Neurological: Negative.   Psychiatric/Behavioral: Negative.     Per HPI unless specifically indicated above     Objective:    BP (!) 145/72   Pulse 68   Wt 184 lb 6.6 oz (83.6 kg)   LMP  (LMP Unknown)   SpO2 97%   BMI 33.73 kg/m   Wt Readings from Last 3 Encounters:  11/10/21 184 lb 6.6 oz (83.6 kg)  07/07/21 188 lb 6.4 oz (85.5 kg)  05/09/21 182 lb (82.6 kg)    Physical Exam Vitals and nursing note reviewed.  Constitutional:      General: She is awake. She is not in acute distress.    Appearance: She is well-developed and well-groomed. She is obese. She is not ill-appearing.  HENT:     Head: Normocephalic.     Comments: Slight diminished hair pattern scalp.    Right Ear: Hearing normal.     Left Ear: Hearing normal.  Eyes:     General: Lids are normal.        Right eye: No discharge.        Left eye: No discharge.     Conjunctiva/sclera: Conjunctivae normal.     Pupils: Pupils are equal, round, and reactive to light.  Neck:     Thyroid: No thyromegaly.     Vascular: No carotid bruit.  Cardiovascular:     Rate and Rhythm: Normal rate and regular rhythm.     Heart sounds: Normal heart sounds. No murmur heard.   No gallop.  Pulmonary:     Effort: Pulmonary effort is normal. No accessory muscle usage or respiratory distress.     Breath sounds: Normal breath sounds.  Abdominal:     General: Bowel sounds are normal.     Palpations: Abdomen is soft.  Musculoskeletal:     Cervical back: Normal range of motion and neck supple.     Right lower leg: No edema.     Left lower leg: No edema.  Skin:    General: Skin is warm and dry.  Neurological:     Mental Status: She is alert and oriented to person, place, and time.  Psychiatric:        Attention and Perception: Attention normal.        Mood and Affect: Mood normal.        Speech: Speech normal.        Behavior: Behavior normal. Behavior is cooperative.        Thought Content:  Thought content normal.   Results for orders placed or performed in visit on 06/13/21  Lipid Panel w/o Chol/HDL Ratio  Result Value Ref Range   Cholesterol, Total 189 100 - 199 mg/dL   Triglycerides 152 (H) 0 - 149 mg/dL   HDL 45 >39 mg/dL   VLDL Cholesterol Cal 27 5 - 40 mg/dL   LDL Chol Calc (NIH) 117 (H) 0 - 99 mg/dL      Assessment & Plan:   Problem List Items Addressed This Visit  Cardiovascular and Mediastinum   Hypertension - Primary    Chronic, ongoing with BP above goal today and above goal on home readings at times.  Increased Losartan to 100 MG daily (educated her on this change) and monitor K+, last 4.5.  Recommend continue to monitor BP daily at home and notify provider if consistent above 130/80.  Will check BMP today.  Recommend focus on DASH diet and modest weight loss.  Continue collaboration with cardiology, appreciate their input.  Return in 6 weeks.      Relevant Medications   losartan (COZAAR) 100 MG tablet   Other Relevant Orders   Basic metabolic panel   Myxoma of heart    History of in 2007, recent echo in hospital showing no return of this.  Continue collaboration with cardiology.      Relevant Medications   losartan (COZAAR) 100 MG tablet     Musculoskeletal and Integument   Osteopenia of neck of left femur    Noted on DEXA June 2020.  Recommend she continue daily Vitamin D3 supplement and adequate calcium intake at home.  Check Vit D level today.  Repeat DEXA June 2025.      Relevant Orders   VITAMIN D 25 Hydroxy (Vit-D Deficiency, Fractures)     Other   B12 deficiency    Ongoing. Continue supplement and recheck B12 level next visit.  Adjust as needed.      Elevated hemoglobin A1c    Noted on past labs with level today improving at 5.7%, recommend continued focus on diet changes and regular activity.  If elevation consider starting medication.      Relevant Orders   Bayer DCA Hb A1c Waived   Elevated TSH    Noted on past labs, will  recheck TSH and Free T4 next visit as recent have been stable -- asymptomatic at this time, suspect subclinical hypothyroid and may benefit from medication in future, but she wishes to hold off.  Recent labs were normal.      Hyperlipidemia    Chronic, ongoing.  Continue Crestor 10 MG Monday/Wednesday/Friday schedule.  Obtain lipid panel today.        Relevant Medications   losartan (COZAAR) 100 MG tablet   Other Relevant Orders   Lipid Panel w/o Chol/HDL Ratio   Major depression, chronic    Chronic, stable.  Denies SI/HI.  Continue current medication regimen and adjust as needed.  Due to age would benefit from change to Zoloft or alternate SSRI in future and discontinuation Paxil, she is aware of this and wishes to maintain regimen at this time.        Obesity    BMI 33.73 today with 4 pounds weight loss.  Recommended eating smaller high protein, low fat meals more frequently and exercising 30 mins a day 5 times a week with a goal of 10-15lb weight loss in the next 3 months. Patient voiced their understanding and motivation to adhere to these recommendations.         Follow up plan: Return in about 6 weeks (around 12/22/2021) for HTN with Losartan increased to 100 MG last visit.

## 2021-11-10 NOTE — Assessment & Plan Note (Signed)
Ongoing. Continue supplement and recheck B12 level next visit.  Adjust as needed.

## 2021-11-11 ENCOUNTER — Encounter: Payer: Self-pay | Admitting: Nurse Practitioner

## 2021-11-11 DIAGNOSIS — E559 Vitamin D deficiency, unspecified: Secondary | ICD-10-CM | POA: Insufficient documentation

## 2021-11-11 LAB — LIPID PANEL W/O CHOL/HDL RATIO
Cholesterol, Total: 197 mg/dL (ref 100–199)
HDL: 48 mg/dL (ref >39)
LDL Chol Calc (NIH): 132 mg/dL — ABNORMAL HIGH (ref 0–99)
Triglycerides: 94 mg/dL (ref 0–149)
VLDL Cholesterol Cal: 17 mg/dL (ref 5–40)

## 2021-11-11 LAB — BASIC METABOLIC PANEL
BUN/Creatinine Ratio: 7 — ABNORMAL LOW (ref 12–28)
BUN: 7 mg/dL — ABNORMAL LOW (ref 8–27)
CO2: 23 mmol/L (ref 20–29)
Calcium: 9.4 mg/dL (ref 8.7–10.3)
Chloride: 104 mmol/L (ref 96–106)
Creatinine, Ser: 1.06 mg/dL — ABNORMAL HIGH (ref 0.57–1.00)
Glucose: 124 mg/dL — ABNORMAL HIGH (ref 70–99)
Potassium: 4.3 mmol/L (ref 3.5–5.2)
Sodium: 142 mmol/L (ref 134–144)
eGFR: 56 mL/min/{1.73_m2} — ABNORMAL LOW (ref >59)

## 2021-11-11 LAB — VITAMIN D 25 HYDROXY (VIT D DEFICIENCY, FRACTURES): Vit D, 25-Hydroxy: 17.8 ng/mL — ABNORMAL LOW (ref 30.0–100.0)

## 2021-11-11 MED ORDER — EZETIMIBE 10 MG PO TABS: 10.0000 mg | ORAL_TABLET | Freq: Every day | ORAL | 4 refills | Status: DC

## 2021-11-11 NOTE — Addendum Note (Signed)
Addended by: [name] T on: 11/11/2021 07:30 PM   Modules accepted: Orders

## 2021-11-11 NOTE — Progress Notes (Signed)
Contacted via [facility] -- please call and ensure she has received below message: Good evening Na, your labs have returned: - Cholesterol levels remain above goal on LDL, it is 132.  Would like to see this <70 for stroke prevention.  Continue Rosuvastatin 10 MG three days a week, as you are tolerating this and we will trying adding on Zetia to take along with this daily.  If any issues with this let me know.  Sometimes the two together can help lower levels better and I know you did not tolerate daily statin in past. - Kidneys continue to show mild kidney disease with no worsening.  We will continue to monitor. - Vitamin D level is on lower side, please increase your Vitamin D supplement to 2000 units daily.  Any questions? Keep being amazing!!  Thank you for allowing me to participate in your care.  I appreciate you. Kindest regards, Jimmye Wisnieski

## 2021-12-23 ENCOUNTER — Ambulatory Visit: Payer: Medicare Other | Admitting: Nurse Practitioner

## 2022-07-03 ENCOUNTER — Ambulatory Visit: Payer: Medicare Other

## 2022-07-10 ENCOUNTER — Ambulatory Visit: Payer: Medicare Other

## 2022-07-13 ENCOUNTER — Ambulatory Visit (INDEPENDENT_AMBULATORY_CARE_PROVIDER_SITE_OTHER): Payer: Medicare Other | Admitting: *Deleted

## 2022-07-13 DIAGNOSIS — Z1231 Encounter for screening mammogram for malignant neoplasm of breast: Secondary | ICD-10-CM

## 2022-07-13 DIAGNOSIS — Z0000 Encounter for general adult medical examination without abnormal findings: Secondary | ICD-10-CM

## 2022-07-13 NOTE — Progress Notes (Signed)
Subjective:   Lisa Bass is a 71 y.o. female who presents for Medicare Annual (Subsequent) preventive examination.  I connected with  Lisa Bass on 07/13/22 by a telephone enabled telemedicine application and verified that I am speaking with the correct person using two identifiers.   I discussed the limitations of evaluation and management by telemedicine. The patient expressed understanding and agreed to proceed.  Patient location: home  Provider location: Tele-Health - Home    Review of Systems     Cardiac Risk Factors include: advanced age (>58mn, >>26women);sedentary lifestyle;obesity (BMI >30kg/m2);hypertension     Objective:    Today's Vitals   There is no height or weight on file to calculate BMI.     07/13/2022   11:10 AM 07/13/2022   11:04 AM 07/07/2021    1:01 PM 09/21/2020    3:42 PM 06/21/2020    9:55 AM 05/14/2020   10:18 AM 05/11/2020    2:50 PM  Advanced Directives  Does Patient Have a Medical Advance Directive? No No No No No No No  Would patient like information on creating a medical advance directive?  No - Patient declined   No - Patient declined No - Patient declined No - Patient declined    Current Medications (verified) Outpatient Encounter Medications as of 07/13/2022  Medication Sig   aspirin 81 MG tablet Take 81 mg by mouth daily.   cholecalciferol (VITAMIN D3) 25 MCG (1000 UT) tablet Take 1,000 Units by mouth daily.   losartan (COZAAR) 100 MG tablet Take 1 tablet (100 mg total) by mouth daily.   Multiple Vitamin (MULTIVITAMIN) tablet Take 1 tablet by mouth daily.   PARoxetine (PAXIL) 40 MG tablet Take 0.5 tablets (20 mg total) by mouth daily. Take 1/2 dose   rosuvastatin (CRESTOR) 10 MG tablet Take 1 tablet (10 MG) by mouth on Monday, Wednesday, and Friday.   vitamin B-12 (CYANOCOBALAMIN) 1000 MCG tablet Take 1,000 mcg by mouth daily.   ezetimibe (ZETIA) 10 MG tablet Take 1 tablet (10 mg total) by mouth daily. (Patient not taking:  Reported on 07/13/2022)   No facility-administered encounter medications on file as of 07/13/2022.    Allergies (verified) Penicillins, Biaxin [clarithromycin], and Lisinopril   History: Past Medical History:  Diagnosis Date   Allergy    Asthma    Cataract    CHF (congestive heart failure) (HCC)    Depression    Hyperlipidemia    Hypertension    OCD (obsessive compulsive disorder)    Palpitations    Perimenopause    Past Surgical History:  Procedure Laterality Date   CATARACT EXTRACTION, BILATERAL  02/2021   CHOLECYSTECTOMY     COLONOSCOPY WITH PROPOFOL N/A 09/07/2017   Procedure: COLONOSCOPY WITH PROPOFOL;  Surgeon: WLucilla Lame MD;  Location: [facility] ENDOSCOPY;  Service: Endoscopy;  Laterality: N/A;   open heart surgery     ROOT CANAL     x2   Family History  Problem Relation Age of Onset   Hypertension Mother    Dementia Father    Hypertension Father    Diabetes Father    Depression Father    GI problems Daughter    Heart disease Maternal Grandfather    Depression Sister    Social History   Socioeconomic History   Marital status: Married    Spouse name: Not on file   Number of children: Not on file   Years of education: Not on file   Highest education level: High school  graduate  Occupational History   Occupation: retired   Tobacco Use   Smoking status: Never   Smokeless tobacco: Never  Vaping Use   Vaping Use: Never used  Substance and Sexual Activity   Alcohol use: No    Alcohol/week: 0.0 standard drinks of alcohol   Drug use: No   Sexual activity: Yes  Other Topics Concern   Not on file  Social History Narrative   Very active in church    Social Determinants of Health   Financial Resource Strain: Low Risk  (07/13/2022)   Overall Financial Resource Strain (CARDIA)    Difficulty of Paying Living Expenses: Not hard at all  Food Insecurity: No Food Insecurity (07/13/2022)   Hunger Vital Sign    Worried About Running Out of Food in the Last Year:  Never true    [place] in the Last Year: Never true  Transportation Needs: No Transportation Needs (07/13/2022)   PRAPARE - Hydrologist (Medical): No    Lack of Transportation (Non-Medical): No  Physical Activity: Inactive (07/13/2022)   Exercise Vital Sign    Days of Exercise per Week: 0 days    Minutes of Exercise per Session: 0 min  Stress: No Stress Concern Present (07/07/2021)   [place] of Stress : Not at all  Social Connections: Goodnight (07/13/2022)   Social Connection and Isolation Panel [NHANES]    Frequency of Communication with Friends and Family: More than three times a week    Frequency of Social Gatherings with Friends and Family: Once a week    Attends Religious Services: More than 4 times per year    Active Member of [employer] or Organizations: Yes    Attends Music therapist: More than 4 times per year    Marital Status: Married    Tobacco Counseling Counseling given: Not Answered   Clinical Intake:  Pre-visit preparation completed: Yes  Pain : No/denies pain     Nutritional Risks: None Diabetes: No  How often do you need to have someone help you when you read instructions, pamphlets, or other written materials from your doctor or pharmacy?: 1 - Never  Diabetic?  no  Interpreter Needed?: No  Information entered by :: Leroy Kennedy LPN   Activities of Daily Living    07/13/2022   11:10 AM  In your present state of health, do you have any difficulty performing the following activities:  Hearing? 0  Vision? 0  Difficulty concentrating or making decisions? 0  Walking or climbing stairs? 0  Dressing or bathing? 0  Doing errands, shopping? 0  Preparing Food and eating ? N  Using the Toilet? N  In the past six months, have you accidently leaked urine? N  Do you have problems with loss of bowel control? N  Managing your  Medications? N  Managing your Finances? N  Housekeeping or managing your Housekeeping? N    Patient Care Team: Venita Lick, NP as PCP - General (Nurse Practitioner) [name], NP as PCP - Family Medicine (Nurse Practitioner) Margaretha Sheffield, MD (Otolaryngology) Yolonda Kida, MD as Consulting Physician (Cardiology)  Indicate any recent Medical Services you may have received from other than [facility] providers in the past year (date may be approximate).     Assessment:   This is a routine wellness examination for Lisa Bass.  Hearing/Vision screen Hearing Screening - Comments:: No trouble  hearing Vision Screening - Comments:: Up to date Cataract surgery 2022 Dr. Wyatt Portela  Dietary issues and exercise activities discussed: Current Exercise Habits: The patient does not participate in regular exercise at present   Goals Addressed             This Visit's Progress    Patient Stated       Loose weight  Increase physical activity       Depression Screen    07/13/2022   11:09 AM 11/10/2021    8:32 AM 07/07/2021    1:02 PM 05/09/2021    3:48 PM 11/08/2020    3:31 PM 06/21/2020    9:58 AM 05/08/2020    5:01 PM  PHQ 2/9 Scores  PHQ - 2 Score 0 0 0 0 0 0 1  PHQ- 9 Score  1  2 0 0 1    Fall Risk    07/13/2022   11:03 AM 07/07/2021    1:02 PM 06/21/2020    9:56 AM 03/23/2019    9:34 AM 02/13/2019    3:05 PM  Fall Risk   Falls in the past year? 0 0 1 0 0  Comment   passes out    Number falls in past yr: 0  1  0  Injury with Fall? 0  1    Risk for fall due to :  Medication side effect Medication side effect;History of fall(s)    Follow up Falls evaluation completed;Education provided;Falls prevention discussed Falls evaluation completed;Education provided;Falls prevention discussed  Falls evaluation completed     FALL RISK PREVENTION PERTAINING TO THE HOME:  Any stairs in or around the home? No  If so, are there any without handrails? No  Home free of loose throw rugs  in walkways, pet beds, electrical cords, etc? Yes  Adequate lighting in your home to reduce risk of falls? Yes   ASSISTIVE DEVICES UTILIZED TO PREVENT FALLS:  Life alert? No  Use of a cane, walker or w/c? No  Grab bars in the bathroom? Yes  Shower chair or bench in shower? No  Elevated toilet seat or a handicapped toilet? No   TIMED UP AND GO:  Was the test performed? No .    Cognitive Function:        07/13/2022   11:04 AM 07/07/2021    1:05 PM 06/21/2020   10:00 AM 02/13/2019    3:08 PM 02/11/2018    3:13 PM  6CIT Screen  What Year? 0 points 0 points 0 points 0 points 0 points  What month? 0 points 0 points 0 points 0 points 0 points  What time? 0 points 0 points 0 points 0 points 0 points  Count back from 20 0 points 0 points 0 points 0 points 0 points  Months in reverse 0 points 0 points 0 points 0 points 0 points  Repeat phrase 0 points 2 points 0 points 0 points 0 points  Total Score 0 points 2 points 0 points 0 points 0 points    Immunizations Immunization History  Administered Date(s) Administered   Pneumococcal Conjugate-13 09/23/2015   Pneumococcal Polysaccharide-23 12/22/2016   Td 03/11/1999    TDAP status: Due, Education has been provided regarding the importance of this vaccine. Advised may receive this vaccine at local pharmacy or Health Dept. Aware to provide a copy of the vaccination record if obtained from local pharmacy or Health Dept. Verbalized acceptance and understanding.  Flu Vaccine status: Due, Education has been provided regarding the  importance of this vaccine. Advised may receive this vaccine at local pharmacy or Health Dept. Aware to provide a copy of the vaccination record if obtained from local pharmacy or Health Dept. Verbalized acceptance and understanding.  Pneumococcal vaccine status: Up to date  Covid-19 vaccine status: Information provided on how to obtain vaccines.   Qualifies for Shingles Vaccine? Yes   Zostavax completed No    Shingrix Completed?: No.    Education has been provided regarding the importance of this vaccine. Patient has been advised to call insurance company to determine out of pocket expense if they have not yet received this vaccine. Advised may also receive vaccine at local pharmacy or Health Dept. Verbalized acceptance and understanding.  Screening Tests Health Maintenance  Topic Date Due   MAMMOGRAM  08/21/2007   Zoster Vaccines- Shingrix (1 of 2) 10/13/2022 (Originally 08/19/2000)   TETANUS/TDAP  07/14/2023 (Originally 03/10/2009)   COLONOSCOPY (Pts 45-72yr Insurance coverage will need to be confirmed)  09/08/2027   Pneumonia Vaccine 72 Years old  Completed   DEXA SCAN  Completed   Hepatitis C Screening  Completed   HPV VACCINES  Aged Out   INFLUENZA VACCINE  Discontinued   COVID-19 Vaccine  Discontinued    Health Maintenance  Health Maintenance Due  Topic Date Due   MAMMOGRAM  08/21/2007    Colorectal cancer screening: Type of screening: Colonoscopy. Completed 2018. Repeat every 10 years  Mammogram status: Ordered  . Pt provided with contact info and advised to call to schedule appt.   Bone Density status: Completed 2020. Results reflect: Bone density results: OSTEOPENIA. Repeat every   years.  Lung Cancer Screening: (Low Dose CT Chest recommended if Age 72-80years, 30 pack-year currently smoking OR have quit w/in 15years.) does not qualify.   Lung Cancer Screening Referral:   Additional Screening:  Hepatitis C Screening: does not qualify; Completed 2016  Vision Screening: Recommended annual ophthalmology exams for early detection of glaucoma and other disorders of the eye. Is the patient up to date with their annual eye exam?  No  Who is the provider or what is the name of the office in which the patient attends annual eye exams? Dr. SWyatt PortelaIf pt is not established with a provider, would they like to be referred to a provider to establish care? No .   Dental Screening:  Recommended annual dental exams for proper oral hygiene  Community Resource Referral / Chronic Care Management: CRR required this visit?  No   CCM required this visit?  No      Plan:     I have personally reviewed and noted the following in the patient's chart:   Medical and social history Use of alcohol, tobacco or illicit drugs  Current medications and supplements including opioid prescriptions.  Functional ability and status Nutritional status Physical activity Advanced directives List of other physicians Hospitalizations, surgeries, and ER visits in previous 12 months Vitals Screenings to include cognitive, depression, and falls Referrals and appointments  In addition, I have reviewed and discussed with patient certain preventive protocols, quality metrics, and best practice recommendations. A written personalized care plan for preventive services as well as general preventive health recommendations were provided to patient.     JLeroy Kennedy LPN   [id number]  Nurse Notes:

## 2022-12-25 ENCOUNTER — Encounter: Payer: Self-pay | Admitting: Physician Assistant

## 2022-12-25 ENCOUNTER — Telehealth (INDEPENDENT_AMBULATORY_CARE_PROVIDER_SITE_OTHER): Payer: Medicare Other | Admitting: Physician Assistant

## 2022-12-25 DIAGNOSIS — J069 Acute upper respiratory infection, unspecified: Secondary | ICD-10-CM | POA: Diagnosis not present

## 2022-12-25 NOTE — Progress Notes (Signed)
Virtual Visit via Video Note  I connected with Lisa Bass on 12/25/22 at  1:20 PM EST by a video enabled telemedicine application and verified that I am speaking with the correct person using two identifiers.  Today's Provider: Talitha Givens, MHS, PA-C Introduced myself to the patient as a PA-C and provided education on APPs in clinical practice.   Location: Patient: at home Provider: [place], [place]    I discussed the limitations of evaluation and management by telemedicine and the availability of in person appointments. The patient expressed understanding and agreed to proceed.   Chief Complaint  Patient presents with   Sinus Problem    Patient says she has been had this really bad sinus infection and is not sure what to take due to her being a high blood pressure. Patient says she has tried over the counter Coricidin. Patient says her symptoms started about 3 days ago and says her husband has been sick with similar symptoms.    Congestion    History of Present Illness:  She reports she is concerned for a sinus infection Onset: sudden  Duration: about 3 days ago Associated symptoms:sinus congestion, sinus pressure, post nasal drainage, productive cough  She denies fever (98.2), body aches, Intervention: Coricidin cold and flu  Sick contacts: reports her husband is also starting to feel ill  She has not tested for COVID at home during this illness  She has been concerned about her BP due to being sick States her systolic has been in the 626R but she has not taken her Losartan due to concern about interactions   Review of Systems  Constitutional:  Negative for chills and fever.  HENT:  Positive for congestion and sinus pain. Negative for ear pain and sore throat.   Respiratory:  Positive for cough and sputum production. Negative for shortness of breath and wheezing.   Gastrointestinal:  Negative for diarrhea, nausea and vomiting.  Musculoskeletal:   Negative for myalgias.  Neurological:  Positive for headaches. Negative for dizziness.    Observations/Objective:   Due to the nature of the virtual visit, physical exam and observations are limited. Able to obtain the following observations:   Alert, oriented x 3  Appears comfortable, in no acute distress.  No scleral injection, no appreciated hoarseness, tachypnea, wheeze or strider. Able to maintain conversation without visible strain.  No cough appreciated during visit.    Assessment and Plan:   Problem List Items Addressed This Visit   None Visit Diagnoses     Upper respiratory tract infection, unspecified type    -  Primary Acute, new concern Reports sinus congestion, productive cough, sinus pressure for 3 days that is not improving with Coricidin Reviewed that she should continue her losartan and paroxetine even while taking OTC medications Reviewed that she should avoid decongestants due to HTN dx and pathophysiology of this recommendation  Recommend she take regular Mucinex and Robitussin along with Tylenol to improve her symptoms Reviewed ED and return precautions. Follow up as needed for persistent or progressing symptoms.        Follow Up Instructions:    I discussed the assessment and treatment plan with the patient. The patient was provided an opportunity to ask questions and all were answered. The patient agreed with the plan and demonstrated an understanding of the instructions.   The patient was advised to call back or seek an in-person evaluation if the symptoms worsen or if the condition fails to improve  as anticipated.  I provided 12 minutes of non-face-to-face time during this encounter.  No follow-ups on file.   I, Allayna Erlich E Amandeep Nesmith, PA-C, have reviewed all documentation for this visit. The documentation on 12/25/22 for the exam, diagnosis, procedures, and orders are all accurate and complete.   Talitha Givens, MHS, PA-C [facility]

## 2023-07-05 ENCOUNTER — Telehealth: Payer: Self-pay | Admitting: Nurse Practitioner

## 2023-07-05 NOTE — Telephone Encounter (Signed)
Patient made aware of Provider's recommendations and verbalized understanding.   

## 2023-07-05 NOTE — Telephone Encounter (Signed)
Copied from CRM [id number]. Topic: Appointment Scheduling - Scheduling Inquiry for Clinic >> Jul 05, 2023 12:12 PM Phill Myron wrote: Reason for CRM: patient scheduled for a physical.  July 30...late afternoon appt.  Does she need to fast and if so what time should she stop eating. PLease advise.

## 2023-07-14 ENCOUNTER — Ambulatory Visit: Payer: Self-pay | Admitting: *Deleted

## 2023-07-14 NOTE — Telephone Encounter (Signed)
  Chief Complaint: medication question Symptoms: Patient is scheduled for crown and was given Rx for clindamycin. After some online research- she is concerned that this antibiotic may not be the appropriate/best medication for her to use. Patient would like PCP input. Patient is aware she is allergic to some antibiotics and choices may be limited.   Disposition: [] ED /[] Urgent Care (no appt availability in office) / [] Appointment(In office/virtual)/ []  [place] [facility]/ [] Home Care/ [] Refused Recommended Disposition /[] [place] [facility]/ [x]  Follow-up with PCP Additional Notes: See notes- patient wants PCP input before taking medication

## 2023-07-14 NOTE — Telephone Encounter (Signed)
Patient made aware of Provider's recommendations and verbalized understanding.   

## 2023-07-14 NOTE — Telephone Encounter (Signed)
Summary: is it safe to take antibiotic before her appt?   Pt states that she is has a dentist appointment today at 1:30. Per pt she had heart surgery and need to know if she need to take an antibiotic before she goes to the dentist. Pt states that the dentist prescribed her Clindamycin to take because she is allergic to penicillin. Pt is wanting to know if it is ok for her to take the Clindamycin. Pt is going to have a crown put on today.    Please advise, pt states if she should not take the medication that she will need to cancel her appointment.         Reason for Disposition  Triager unable to answer question  [1] Caller has URGENT medicine question about med that PCP or specialist prescribed AND [2] triager unable to answer question  Answer Assessment - Initial Assessment Questions 1.) DENTAL PROCEDURE: "What dental procedure are you having performed?"    Crown- clindamycin 300 mg every 6 hours was prescribed.After research on line- -patient is concerned that it could cause C-diff , heart problems- and read this medication is not recommended for dental procedures. Patient states - she is allergic to penicillin and Biaxin. Patient has appointment at 1:30 today- she will cancel if needed until she hears. Patient is concerned and wants to know how PCP feels about her taking this medication   2.) CARDIAC CONDITION: "Do you have any heart problems? (e.g., valve replacement, congenital heart disease, heart transplant, previous endocarditis)   Hx open heart surgery- always has antibiotic pre appointment  Answer Assessment - Initial Assessment Questions 1. NAME of MEDICINE: "What medicine(s) are you calling about?"     Clindamycin 2. QUESTION: "What is your question?" (e.g., double dose of medicine, side effect)     Would this be appropriate for her to take pre dental procedure- crown 3. PRESCRIBER: "Who prescribed the medicine?" Reason: if prescribed by specialist, call should be referred to  that group.     dentist 4. SYMPTOMS: "Do you have any symptoms?" If Yes, ask: "What symptoms are you having?"  "How bad are the symptoms (e.g., mild, moderate, severe)      Patient is scheduled for crown and was given Rx for clindamycin. After some online research- she is concerned that this antibiotic may not be the appropriate/best medication for her to use. Patient would like PCP input. Patient is aware she is allergic to some antibiotics and choices may be limited.  Protocols used: Dental Procedure Antibiotic Prophylaxis-A-AH, Medication Question Call-A-AH

## 2023-07-18 NOTE — Patient Instructions (Signed)

## 2023-07-20 ENCOUNTER — Encounter: Payer: Self-pay | Admitting: Nurse Practitioner

## 2023-07-20 ENCOUNTER — Ambulatory Visit (INDEPENDENT_AMBULATORY_CARE_PROVIDER_SITE_OTHER): Payer: Medicare Other | Admitting: Nurse Practitioner

## 2023-07-20 VITALS — BP 148/98 | HR 98 | Temp 98.0°F | Ht 62.01 in | Wt 169.6 lb

## 2023-07-20 DIAGNOSIS — R7989 Other specified abnormal findings of blood chemistry: Secondary | ICD-10-CM | POA: Diagnosis not present

## 2023-07-20 DIAGNOSIS — Z23 Encounter for immunization: Secondary | ICD-10-CM

## 2023-07-20 DIAGNOSIS — E66811 Obesity, class 1: Secondary | ICD-10-CM

## 2023-07-20 DIAGNOSIS — E782 Mixed hyperlipidemia: Secondary | ICD-10-CM | POA: Diagnosis not present

## 2023-07-20 DIAGNOSIS — L918 Other hypertrophic disorders of the skin: Secondary | ICD-10-CM | POA: Diagnosis not present

## 2023-07-20 DIAGNOSIS — F329 Major depressive disorder, single episode, unspecified: Secondary | ICD-10-CM

## 2023-07-20 DIAGNOSIS — Z6831 Body mass index (BMI) 31.0-31.9, adult: Secondary | ICD-10-CM

## 2023-07-20 DIAGNOSIS — M85852 Other specified disorders of bone density and structure, left thigh: Secondary | ICD-10-CM

## 2023-07-20 DIAGNOSIS — E538 Deficiency of other specified B group vitamins: Secondary | ICD-10-CM | POA: Diagnosis not present

## 2023-07-20 DIAGNOSIS — D151 Benign neoplasm of heart: Secondary | ICD-10-CM

## 2023-07-20 DIAGNOSIS — R7309 Other abnormal glucose: Secondary | ICD-10-CM

## 2023-07-20 DIAGNOSIS — E6609 Other obesity due to excess calories: Secondary | ICD-10-CM

## 2023-07-20 DIAGNOSIS — Z0000 Encounter for general adult medical examination without abnormal findings: Secondary | ICD-10-CM

## 2023-07-20 DIAGNOSIS — E559 Vitamin D deficiency, unspecified: Secondary | ICD-10-CM

## 2023-07-20 DIAGNOSIS — Z1231 Encounter for screening mammogram for malignant neoplasm of breast: Secondary | ICD-10-CM

## 2023-07-20 DIAGNOSIS — E669 Obesity, unspecified: Secondary | ICD-10-CM

## 2023-07-20 DIAGNOSIS — I10 Essential (primary) hypertension: Secondary | ICD-10-CM | POA: Diagnosis not present

## 2023-07-20 LAB — MICROALBUMIN, URINE WAIVED
Creatinine, Urine Waived: 50 mg/dL (ref 10–300)
Microalb, Ur Waived: 10 mg/L (ref 0–19)
Microalb/Creat Ratio: <30 mg/g (ref <30)

## 2023-07-20 LAB — BAYER DCA HB A1C WAIVED: HB A1C (BAYER DCA - WAIVED): 6.3 % — ABNORMAL HIGH (ref 4.8–5.6)

## 2023-07-20 MED ORDER — ROSUVASTATIN CALCIUM 10 MG PO TABS: ORAL_TABLET | ORAL | 4 refills | Status: DC

## 2023-07-20 MED ORDER — LOSARTAN POTASSIUM 100 MG PO TABS: 100.0000 mg | ORAL_TABLET | Freq: Every day | ORAL | 4 refills | Status: DC

## 2023-07-20 MED ORDER — PAROXETINE HCL 40 MG PO TABS: 20.0000 mg | ORAL_TABLET | Freq: Every day | ORAL | 4 refills | Status: DC

## 2023-07-20 NOTE — Assessment & Plan Note (Signed)
Improved recent labs, will recheck TSH and Free T4 today -- asymptomatic at this time.

## 2023-07-20 NOTE — Assessment & Plan Note (Addendum)
Chronic, ongoing.  BP above goal in office due to missing doses on medication often recently.  Restart Losartan 100 MG daily and reiterated importance of taking this daily to prevent BP elevations which could lead to acute stroke.  Recommend continue to monitor BP daily at home and notify provider if consistent above 130/80.  Labs: CBC, CMP, TSH.  Recommend focus on DASH diet and modest weight loss.  Continue collaboration with cardiology, appreciate their input.  Urine ALB 30 June 2023.  Return in 4 weeks.

## 2023-07-20 NOTE — Assessment & Plan Note (Addendum)
A1c 6.3% today, trend up from 5.7% two years ago.  Highly recommend continued focus on diet changes and regular activity.  If trend up to 6.5% or greater consider starting medication.

## 2023-07-20 NOTE — Assessment & Plan Note (Signed)
Chronic, ongoing.  Is not consistently taking supplement.  Recommend she restart for bone health with her osteopenia.  Check level today.

## 2023-07-20 NOTE — Assessment & Plan Note (Signed)
BMI 31.01.  Recommended eating smaller high protein, low fat meals more frequently and exercising 30 mins a day 5 times a week with a goal of 10-15lb weight loss in the next 3 months. Patient voiced their understanding and motivation to adhere to these recommendations.

## 2023-07-20 NOTE — Assessment & Plan Note (Signed)
Ongoing.  Noted on DEXA June 2020.  Recommend she continue daily Vitamin D3 supplement and adequate calcium intake at home.  Check Vit D level today.  Repeat DEXA June 2025.

## 2023-07-20 NOTE — Assessment & Plan Note (Signed)
Ongoing. Recommend she restart B12 supplement.  Check level today.

## 2023-07-20 NOTE — Assessment & Plan Note (Signed)
Chronic, ongoing.  Recommend she restart Crestor 10 MG Monday/Wednesday/Friday schedule for prevention.  Obtain lipid panel today.

## 2023-07-20 NOTE — Assessment & Plan Note (Signed)
Chronic, stable.  Denies SI/HI.  Continue current medication regimen and adjust as needed.  Due to age would benefit from change to Zoloft or alternate SSRI in future and discontinuation Paxil, she is aware of this and wishes to maintain regimen at this time.

## 2023-07-20 NOTE — Progress Notes (Signed)
BP (!) 148/98 (BP Location: Left Arm, Patient Position: Sitting, Cuff Size: Normal)   Pulse 98   Temp 98 F (36.7 C) (Oral)   Ht 5' 2.01" (1.575 m)   Wt 169 lb 9.6 oz (76.9 kg)   LMP  (LMP Unknown)   SpO2 99%   BMI 31.01 kg/m    Subjective:    Patient ID: Lisa Bass, female    DOB: 1951/12/06, 72 y.o.   MRN: [id number]  HPI: Lisa Bass is a 72 y.o. female presenting on 07/20/2023 for comprehensive medical examination and Medicare Wellness + medication refills. Current medical complaints include:none  She currently lives with: husband Menopausal Symptoms: no  Would like referral for skin tag under left eye.  Fungal disease to left great toenail.  HYPERTENSION / HYPERLIPIDEMIA She is taking Losartan, but has not taken medication as she should -- when she started going to dentist she stopped taking as was concerned with taking it with abx therapy.  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  Has not been taking cholesterol medication -- was taking Rosuvastatin 3 days a week. Satisfied with current treatment? yes Duration of hypertension: chronic BP monitoring frequency: daily for one week BP range: 140/80-90 BP medication side effects: no Duration of hyperlipidemia: chronic Cholesterol medication side effects: no Cholesterol supplements: none Medication compliance: good compliance Aspirin: yes Recent stressors: no Recurrent headaches: no Visual changes: no Palpitations: no Dyspnea: no Chest pain: no Lower extremity edema: no Dizzy/lightheaded: no  The 10-year ASCVD risk score (Arnett DK, et al., 2019) is: 20.4%   Values used to calculate the score:     Age: 39 years     Sex: Female     Is Non-Hispanic African American: No     Diabetic: No     Tobacco smoker: No     Systolic Blood Pressure: 148 mmHg     Is BP treated: Yes     HDL Cholesterol: 48 mg/dL     Total Cholesterol: 197 mg/dL  ELEVATED TSH History  of elevations, but last two checks were in normal range. Fatigue: no Cold intolerance: no Heat intolerance: no Weight gain: no Weight loss: no Constipation: no Diarrhea/loose stools: no Palpitations: no Lower extremity edema: no Anxiety/depressed mood: no   OSTEOPENIA Last DEXA on 05/30/2019 = The BMD measured at Femur Neck Left is 0.809 g/cm2 with a T-score of -1.6.  Satisfied with current treatment?: yes Past osteoporosis medications/treatments: supplements Adequate calcium & vitamin D: not regularly Weight bearing exercises: yes   PREDIABETES Last A1c 5.7% in 2022, trending down. Has underlying B12 deficiency, last level stable, however not taking supplement regularly now. Polydipsia/polyuria: no Visual disturbance: no Chest pain: no Paresthesias: no   DEPRESSION Continues on Paxil 20 MG daily, has missed doses.  Dentist has been major stressor for her recently. Mood status: stable Satisfied with current treatment?: yes Symptom severity: mild Duration of current treatment : chronic Side effects: no Medication compliance: good compliance Psychotherapy/counseling: none Depressed mood: no Anxious mood: no Anhedonia: no Significant weight loss or gain: no Insomnia: none Fatigue: no Feelings of worthlessness or guilt: no Impaired concentration/indecisiveness: no Suicidal ideations: no Hopelessness: no Crying spells: no    07/20/2023    3:21 PM 07/13/2022   11:09 AM 11/10/2021    8:32 AM 07/07/2021    1:02 PM 05/09/2021    3:48 PM  Depression screen PHQ 2/9  Decreased Interest 1 0  0 0 0  Down, Depressed, Hopeless 0 0 0 0 0  PHQ - 2 Score 1 0 0 0 0  Altered sleeping 0  1  0  Tired, decreased energy 1  0  0  Change in appetite 0  0  2  Feeling bad or failure about yourself  1  0  0  Trouble concentrating 0  0  0  Moving slowly or fidgety/restless 0  0  0  Suicidal thoughts 0  0  0  PHQ-9 Score 3  1  2   Difficult doing work/chores Not difficult at all  Not  difficult at all  Not difficult at all      07/20/2023    3:21 PM 11/10/2021    8:32 AM 02/22/2019   10:40 AM  GAD 7 : Generalized Anxiety Score  Nervous, Anxious, on Edge 2 1 1   Control/stop worrying 2 0 0  Worry too much - different things 0 0 1  Trouble relaxing 0 1 0  Restless 0 0 1  Easily annoyed or irritable 0 1 0  Afraid - awful might happen 0 0 1  Total GAD 7 Score 4 3 4   Anxiety Difficulty Not difficult at all Not difficult at all Not difficult at all      06/21/2020    9:56 AM 09/21/2020    3:42 PM 07/07/2021    1:02 PM 07/13/2022   11:03 AM 07/20/2023    3:21 PM  Fall Risk  Falls in the past year? 1  0 0 0  Was there an injury with Fall? 1   0 0  Fall Risk Category Calculator 3   0 0  Fall Risk Category (Retired) High   Low   (RETIRED) Patient Fall Risk Level High fall risk Low fall risk Low fall risk Low fall risk   Patient at Risk for Falls Due to Medication side effect;History of fall(s)  Medication side effect  No Fall Risks  Fall risk Follow up   Falls evaluation completed;Education provided;Falls prevention discussed Falls evaluation completed;Education provided;Falls prevention discussed Falls evaluation completed    Functional Status Survey: Is the patient deaf or have difficulty hearing?: No Does the patient have difficulty seeing, even when wearing glasses/contacts?: No Does the patient have difficulty concentrating, remembering, or making decisions?: No Does the patient have difficulty walking or climbing stairs?: No Does the patient have difficulty dressing or bathing?: No Does the patient have difficulty doing errands alone such as visiting a doctor's office or shopping?: No   Past Medical History:  Past Medical History:  Diagnosis Date   Allergy    Asthma    Cataract    CHF (congestive heart failure) (HCC)    Depression    Hyperlipidemia    Hypertension    OCD (obsessive compulsive disorder)    Palpitations    Perimenopause     Surgical  History:  Past Surgical History:  Procedure Laterality Date   CATARACT EXTRACTION, BILATERAL  02/2021   CHOLECYSTECTOMY     COLONOSCOPY WITH PROPOFOL N/A 09/07/2017   Procedure: COLONOSCOPY WITH PROPOFOL;  Surgeon: Midge Minium, MD;  Location: [facility] ENDOSCOPY;  Service: Endoscopy;  Laterality: N/A;   open heart surgery     ROOT CANAL     x2    Medications:  Current Outpatient Medications on File Prior to Visit  Medication Sig   aspirin 81 MG tablet Take 81 mg by mouth daily.   cholecalciferol (VITAMIN D3) 25 MCG (1000 UT) tablet  Take 1,000 Units by mouth daily.   Multiple Vitamin (MULTIVITAMIN) tablet Take 1 tablet by mouth daily.   vitamin B-12 (CYANOCOBALAMIN) 1000 MCG tablet Take 1,000 mcg by mouth daily.   No current facility-administered medications on file prior to visit.    Allergies:  Allergies  Allergen Reactions   Penicillins Anaphylaxis   Biaxin [Clarithromycin] Other (See Comments)    GI issues   Lisinopril Diarrhea    Social History:  Social History   Socioeconomic History   Marital status: Married    Spouse name: Not on file   Number of children: Not on file   Years of education: Not on file   Highest education level: High school graduate  Occupational History   Occupation: retired   Tobacco Use   Smoking status: Never   Smokeless tobacco: Never  Vaping Use   Vaping status: Never Used  Substance and Sexual Activity   Alcohol use: No    Alcohol/week: 0.0 standard drinks of alcohol   Drug use: No   Sexual activity: Yes  Other Topics Concern   Not on file  Social History Narrative   Very active in church    Social Determinants of Health   Financial Resource Strain: Low Risk  (07/20/2023)   Overall Financial Resource Strain (CARDIA)    Difficulty of Paying Living Expenses: Not hard at all  Food Insecurity: No Food Insecurity (07/20/2023)   Hunger Vital Sign    Worried About Running Out of Food in the Last Year: Never true    Ran Out of Food in  the Last Year: Never true  Transportation Needs: No Transportation Needs (07/20/2023)   PRAPARE - Administrator, Civil Service (Medical): No    Lack of Transportation (Non-Medical): No  Physical Activity: Insufficiently Active (07/20/2023)   Exercise Vital Sign    Days of Exercise per Week: 2 days    Minutes of Exercise per Session: 20 min  Stress: No Stress Concern Present (07/20/2023)   [employer] of Occupational Health - Occupational Stress Questionnaire    Feeling of Stress : Only a little  Social Connections: Moderately Integrated (07/20/2023)   Social Connection and Isolation Panel [NHANES]    Frequency of Communication with Friends and Family: More than three times a week    Frequency of Social Gatherings with Friends and Family: More than three times a week    Attends Religious Services: More than 4 times per year    Active Member of [employer] or Organizations: No    Attends Banker Meetings: Never    Marital Status: Married  Catering manager Violence: Not At Risk (07/20/2023)   Humiliation, Afraid, Rape, and Kick questionnaire    Fear of Current or Ex-Partner: No    Emotionally Abused: No    Physically Abused: No    Sexually Abused: No   Social History   Tobacco Use  Smoking Status Never  Smokeless Tobacco Never   Social History   Substance and Sexual Activity  Alcohol Use No   Alcohol/week: 0.0 standard drinks of alcohol    Family History:  Family History  Problem Relation Age of Onset   Hypertension Mother    Dementia Father    Hypertension Father    Diabetes Father    Depression Father    GI problems Daughter    Heart disease Maternal Grandfather    Depression Sister     Past medical history, surgical history, medications, allergies, family history and social history reviewed  with patient today and changes made to appropriate areas of the chart.   ROS All other ROS negative except what is listed above and in the HPI.       Objective:    BP (!) 148/98 (BP Location: Left Arm, Patient Position: Sitting, Cuff Size: Normal)   Pulse 98   Temp 98 F (36.7 C) (Oral)   Ht 5' 2.01" (1.575 m)   Wt 169 lb 9.6 oz (76.9 kg)   LMP  (LMP Unknown)   SpO2 99%   BMI 31.01 kg/m   Wt Readings from Last 3 Encounters:  07/20/23 169 lb 9.6 oz (76.9 kg)  11/10/21 184 lb 6.6 oz (83.6 kg)  07/07/21 188 lb 6.4 oz (85.5 kg)    Physical Exam Vitals and nursing note reviewed. Exam conducted with a chaperone present.  Constitutional:      General: She is awake. She is not in acute distress.    Appearance: She is well-developed and well-groomed. She is not ill-appearing or toxic-appearing.  HENT:     Head: Normocephalic and atraumatic.     Right Ear: Hearing, tympanic membrane, ear canal and external ear normal. No drainage.     Left Ear: Hearing, tympanic membrane, ear canal and external ear normal. No drainage.     Nose: Nose normal.     Right Sinus: No maxillary sinus tenderness or frontal sinus tenderness.     Left Sinus: No maxillary sinus tenderness or frontal sinus tenderness.     Mouth/Throat:     Mouth: Mucous membranes are moist.     Pharynx: Oropharynx is clear. Uvula midline. No pharyngeal swelling, oropharyngeal exudate or posterior oropharyngeal erythema.  Eyes:     General: Lids are normal.        Right eye: No discharge.        Left eye: No discharge.     Extraocular Movements: Extraocular movements intact.     Conjunctiva/sclera: Conjunctivae normal.     Pupils: Pupils are equal, round, and reactive to light.     Visual Fields: Right eye visual fields normal and left eye visual fields normal.  Neck:     Thyroid: No thyromegaly.     Vascular: No carotid bruit.     Trachea: Trachea normal.  Cardiovascular:     Rate and Rhythm: Normal rate and regular rhythm.     Pulses:          Dorsalis pedis pulses are 2+ on the right side and 2+ on the left side.       Posterior tibial pulses are 2+ on the right side  and 2+ on the left side.     Heart sounds: Normal heart sounds. No murmur heard.    No gallop.  Pulmonary:     Effort: Pulmonary effort is normal. No accessory muscle usage or respiratory distress.     Breath sounds: Normal breath sounds.  Chest:  Breasts:    Right: Normal.     Left: Normal.  Abdominal:     General: Bowel sounds are normal.     Palpations: Abdomen is soft. There is no hepatomegaly or splenomegaly.     Tenderness: There is no abdominal tenderness.  Musculoskeletal:        General: Normal range of motion.     Cervical back: Normal range of motion and neck supple.     Right lower leg: No edema.     Left lower leg: No edema.     Right foot: Normal range  of motion.     Left foot: Normal range of motion.  Feet:     Right foot:     Protective Sensation: 10 sites tested.  10 sites sensed.     Skin integrity: Skin integrity normal.     Toenail Condition: Right toenails are normal.     Left foot:     Protective Sensation: 10 sites tested.  10 sites sensed.     Skin integrity: Skin integrity normal.     Toenail Condition: Fungal disease present.    Comments: Fungal disease to left great toe Lymphadenopathy:     Head:     Right side of head: No submental, submandibular, tonsillar, preauricular or posterior auricular adenopathy.     Left side of head: No submental, submandibular, tonsillar, preauricular or posterior auricular adenopathy.     Cervical: No cervical adenopathy.     Upper Body:     Right upper body: No supraclavicular, axillary or pectoral adenopathy.     Left upper body: No supraclavicular, axillary or pectoral adenopathy.  Skin:    General: Skin is warm and dry.     Capillary Refill: Capillary refill takes less than 2 seconds.     Findings: No rash.       Neurological:     Mental Status: She is alert and oriented to person, place, and time.     Gait: Gait is intact.     Deep Tendon Reflexes: Reflexes are normal and symmetric.     Reflex Scores:       Brachioradialis reflexes are 2+ on the right side and 2+ on the left side.      Patellar reflexes are 2+ on the right side and 2+ on the left side. Psychiatric:        Attention and Perception: Attention normal.        Mood and Affect: Mood normal.        Speech: Speech normal.        Behavior: Behavior normal. Behavior is cooperative.        Thought Content: Thought content normal.        Judgment: Judgment normal.       07/20/2023    4:00 PM 07/13/2022   11:04 AM 07/07/2021    1:05 PM 06/21/2020   10:00 AM 02/13/2019    3:08 PM  6CIT Screen  What Year? 0 points 0 points 0 points 0 points 0 points  What month? 0 points 0 points 0 points 0 points 0 points  What time? 0 points 0 points 0 points 0 points 0 points  Count back from 20 0 points 0 points 0 points 0 points 0 points  Months in reverse 0 points 0 points 0 points 0 points 0 points  Repeat phrase 2 points 0 points 2 points 0 points 0 points  Total Score 2 points 0 points 2 points 0 points 0 points   Results for orders placed or performed in visit on 07/20/23  Bayer DCA Hb A1c Waived  Result Value Ref Range   HB A1C (BAYER DCA - WAIVED) 6.3 (H) 4.8 - 5.6 %  Microalbumin, Urine Waived  Result Value Ref Range   Microalb, Ur Waived 10 0 - 19 mg/L   Creatinine, Urine Waived 50 10 - 300 mg/dL   Microalb/Creat Ratio <30 <30 mg/g      Assessment & Plan:   Problem List Items Addressed This Visit       Cardiovascular and Mediastinum   Hypertension  Chronic, ongoing.  BP above goal in office due to missing doses on medication often recently.  Restart Losartan 100 MG daily and reiterated importance of taking this daily to prevent BP elevations which could lead to acute stroke.  Recommend continue to monitor BP daily at home and notify provider if consistent above 130/80.  Labs: CBC, CMP, TSH.  Recommend focus on DASH diet and modest weight loss.  Continue collaboration with cardiology, appreciate their input.  Urine ALB 30 June 2023.  Return in 4 weeks.      Relevant Medications   losartan (COZAAR) 100 MG tablet   rosuvastatin (CRESTOR) 10 MG tablet   Other Relevant Orders   Microalbumin, Urine Waived (Completed)   CBC with Differential/Platelet   Comprehensive metabolic panel   Myxoma of heart    History of in 2007, recent echo in hospital showing no return of this.  Continue collaboration with cardiology.      Relevant Medications   losartan (COZAAR) 100 MG tablet   rosuvastatin (CRESTOR) 10 MG tablet     Musculoskeletal and Integument   Osteopenia of neck of left femur    Ongoing.  Noted on DEXA June 2020.  Recommend she continue daily Vitamin D3 supplement and adequate calcium intake at home.  Check Vit D level today.  Repeat DEXA June 2025.        Other   B12 deficiency    Ongoing. Recommend she restart B12 supplement.  Check level today.      Relevant Orders   Vitamin B12   Elevated hemoglobin A1c    A1c 6.3% today, trend up from 5.7% two years ago.  Highly recommend continued focus on diet changes and regular activity.  If trend up to 6.5% or greater consider starting medication.      Relevant Orders   Bayer DCA Hb A1c Waived (Completed)   Microalbumin, Urine Waived (Completed)   Elevated TSH    Improved recent labs, will recheck TSH and Free T4 today -- asymptomatic at this time.      Relevant Orders   T4, free   TSH   Thyroid peroxidase antibody   Hyperlipidemia    Chronic, ongoing.  Recommend she restart Crestor 10 MG Monday/Wednesday/Friday schedule for prevention.  Obtain lipid panel today.        Relevant Medications   losartan (COZAAR) 100 MG tablet   rosuvastatin (CRESTOR) 10 MG tablet   Other Relevant Orders   Comprehensive metabolic panel   Lipid Panel w/o Chol/HDL Ratio   Major depression, chronic    Chronic, stable.  Denies SI/HI.  Continue current medication regimen and adjust as needed.  Due to age would benefit from change to Zoloft or alternate SSRI in future  and discontinuation Paxil, she is aware of this and wishes to maintain regimen at this time.        Relevant Medications   PARoxetine (PAXIL) 40 MG tablet   Obesity    BMI 31.01.  Recommended eating smaller high protein, low fat meals more frequently and exercising 30 mins a day 5 times a week with a goal of 10-15lb weight loss in the next 3 months. Patient voiced their understanding and motivation to adhere to these recommendations.       Vitamin D deficiency    Chronic, ongoing.  Is not consistently taking supplement.  Recommend she restart for bone health with her osteopenia.  Check level today.      Relevant Orders   VITAMIN D 25 Hydroxy (  Vit-D Deficiency, Fractures)   Other Visit Diagnoses     Medicare annual wellness visit, subsequent    -  Primary   Medicare Wellness due and performed with patient today.   Skin tag       Referral placed to dermatology.   Relevant Orders   Ambulatory referral to Dermatology   Onychomycosis       To left great toe, recommend she use OTC fungal nail polish to start as she prefers simple treatment.   Encounter for screening mammogram for malignant neoplasm of breast       Due for mammogram, ordered today and discussed how to schedule.   Relevant Orders   MM 3D SCREENING MAMMOGRAM BILATERAL BREAST   Encounter for annual physical exam       Annual physical today with labs and health maintenance reviewed, discussed with patient.        Follow up plan: Return in about 4 weeks (around 08/17/2023) for HTN -- restart Losartan.   LABORATORY TESTING:  - Pap smear: not applicable  IMMUNIZATIONS:   - Tdap: Tetanus vaccination status reviewed: Refused. - Influenza: Up to date - Pneumovax: Up to date - Prevnar: Up to date - COVID: Refused - HPV: Not applicable - Shingrix vaccine: Refused  SCREENING: -Mammogram: Ordered today  - Colonoscopy: Up to date  - Bone Density: Up to date  -Hearing Test: Not applicable  -Spirometry: Not  applicable   PATIENT COUNSELING:   Advised to take 1 mg of folate supplement per day if capable of pregnancy.   Sexuality: Discussed sexually transmitted diseases, partner selection, use of condoms, avoidance of unintended pregnancy  and contraceptive alternatives.   Advised to avoid cigarette smoking.  I discussed with the patient that most people either abstain from alcohol or drink within safe limits (<=14/week and <=4 drinks/occasion for males, <=7/weeks and <= 3 drinks/occasion for females) and that the risk for alcohol disorders and other health effects rises proportionally with the number of drinks per week and how often a drinker exceeds daily limits.  Discussed cessation/primary prevention of drug use and availability of treatment for abuse.   Diet: Encouraged to adjust caloric intake to maintain  or achieve ideal body weight, to reduce intake of dietary saturated fat and total fat, to limit sodium intake by avoiding high sodium foods and not adding table salt, and to maintain adequate dietary potassium and calcium preferably from fresh fruits, vegetables, and low-fat dairy products.    Stressed the importance of regular exercise  Injury prevention: Discussed safety belts, safety helmets, smoke detector, smoking near bedding or upholstery.   Dental health: Discussed importance of regular tooth brushing, flossing, and dental visits.    NEXT PREVENTATIVE PHYSICAL DUE IN 1 YEAR. Return in about 4 weeks (around 08/17/2023) for HTN -- restart Losartan.

## 2023-07-20 NOTE — Assessment & Plan Note (Signed)
History of in 2007, recent echo in hospital showing no return of this.  Continue collaboration with cardiology.

## 2023-07-20 NOTE — Progress Notes (Signed)
Contacted via MyChart   A1c is creeping up a little to 6.3%, was 5.7%.  Do not want it getting to 6.5% or greater as that is considered diabetes.  We would like to see A1c under 5.7%.  Work on diet changes and try to get out for regular exercise -- 30 minutes a day 5 days a week.  Any questions?

## 2023-07-21 ENCOUNTER — Telehealth: Payer: Self-pay

## 2023-07-21 NOTE — Telephone Encounter (Signed)
Pt given lab results per notes of J. Cannady NP on 07/21/23. Pt verbalized understanding.

## 2023-07-21 NOTE — Progress Notes (Signed)
Contacted via MyChart -- however please call as does not always check   Good afternoon Deolinda, your labs have returned: - Vitamin D is low, I do recommend you take over the counter Vitamin D3 2000 units daily for bone health. - Cholesterol levels are elevated, as discussed restart Rosuvastatin 3 days a week. - Remainder of labs are stable, no medication changes needed.  Any questions? Keep being amazing!!  Thank you for allowing me to participate in your care.  I appreciate you. Kindest regards, Shere Eisenhart

## 2023-08-21 NOTE — Patient Instructions (Signed)
Be Involved in Caring For Your Health:  Taking Medications When medications are taken as directed, they can greatly improve your health. But if they are not taken as prescribed, they may not work. In some cases, not taking them correctly can be harmful. To help ensure your treatment remains effective and safe, understand your medications and how to take them. Bring your medications to each visit for review by your provider.  Your lab results, notes, and after visit summary will be available on My Chart. We strongly encourage you to use this feature. If lab results are abnormal the clinic will contact you with the appropriate steps. If the clinic does not contact you assume the results are satisfactory. You can always view your results on My Chart. If you have questions regarding your health or results, please contact the clinic during office hours. You can also ask questions on My Chart.  We at [facility] are grateful that you chose us to provide your care. We strive to provide evidence-based and compassionate care and are always looking for feedback. If you get a survey from the clinic please complete this so we can hear your opinions.  DASH Eating Plan DASH stands for Dietary Approaches to Stop Hypertension. The DASH eating plan is a healthy eating plan that has been shown to: Lower high blood pressure (hypertension). Reduce your risk for type 2 diabetes, heart disease, and stroke. Help with weight loss. What are tips for following this plan? Reading food labels Check food labels for the amount of salt (sodium) per serving. Choose foods with less than 5 percent of the Daily Value (DV) of sodium. In general, foods with less than 300 milligrams (mg) of sodium per serving fit into this eating plan. To find whole grains, look for the word "whole" as the first word in the ingredient list. Shopping Buy products labeled as "low-sodium" or "no salt added." Buy fresh foods. Avoid canned  foods and pre-made or frozen meals. Cooking Try not to add salt when you cook. Use salt-free seasonings or herbs instead of table salt or sea salt. Check with your health care provider or pharmacist before using salt substitutes. Do not fry foods. Cook foods in healthy ways, such as baking, boiling, grilling, roasting, or broiling. Cook using oils that are good for your heart. These include olive, canola, avocado, soybean, and sunflower oil. Meal planning  Eat a balanced diet. This should include: 4 or more servings of fruits and 4 or more servings of vegetables each day. Try to fill half of your plate with fruits and vegetables. 6-8 servings of whole grains each day. 6 or less servings of lean meat, poultry, or fish each day. 1 oz is 1 serving. A 3 oz (85 g) serving of meat is about the same size as the palm of your hand. One egg is 1 oz (28 g). 2-3 servings of low-fat dairy each day. One serving is 1 cup (237 mL). 1 serving of nuts, seeds, or beans 5 times each week. 2-3 servings of heart-healthy fats. Healthy fats called omega-3 fatty acids are found in foods such as walnuts, flaxseeds, fortified milks, and eggs. These fats are also found in cold-water fish, such as sardines, salmon, and mackerel. Limit how much you eat of: Canned or prepackaged foods. Food that is high in trans fat, such as fried foods. Food that is high in saturated fat, such as fatty meat. Desserts and other sweets, sugary drinks, and other foods with added sugar. Full-fat   dairy products. Do not salt foods before eating. Do not eat more than 4 egg yolks a week. Try to eat at least 2 vegetarian meals a week. Eat more home-cooked food and less restaurant, buffet, and fast food. Lifestyle When eating at a restaurant, ask if your food can be made with less salt or no salt. If you drink alcohol: Limit how much you have to: 0-1 drink a day if you are female. 0-2 drinks a day if you are female. Know how much alcohol is in  your drink. In the [place] one drink is one 12 oz bottle of beer (355 mL), one 5 oz glass of wine (148 mL), or one 1 oz glass of hard liquor (44 mL). General information Avoid eating more than 2,300 mg of salt a day. If you have hypertension, you may need to reduce your sodium intake to 1,500 mg a day. Work with your provider to stay at a healthy body weight or lose weight. Ask what the best weight range is for you. On most days of the week, get at least 30 minutes of exercise that causes your heart to beat faster. This may include walking, swimming, or biking. Work with your provider or dietitian to adjust your eating plan to meet your specific calorie needs. What foods should I eat? Fruits All fresh, dried, or frozen fruit. Canned fruits that are in their natural juice and do not have sugar added to them. Vegetables Fresh or frozen vegetables that are raw, steamed, roasted, or grilled. Low-sodium or reduced-sodium tomato and vegetable juice. Low-sodium or reduced-sodium tomato sauce and tomato paste. Low-sodium or reduced-sodium canned vegetables. Grains Whole-grain or whole-wheat bread. Whole-grain or whole-wheat pasta. Brown rice. Oatmeal. Quinoa. Bulgur. Whole-grain and low-sodium cereals. Pita bread. Low-fat, low-sodium crackers. Whole-wheat flour tortillas. Meats and other proteins Skinless chicken or turkey. Ground chicken or turkey. Pork with fat trimmed off. Fish and seafood. Egg whites. Dried beans, peas, or lentils. Unsalted nuts, nut butters, and seeds. Unsalted canned beans. Lean cuts of beef with fat trimmed off. Low-sodium, lean precooked or cured meat, such as sausages or meat loaves. Dairy Low-fat (1%) or fat-free (skim) milk. Reduced-fat, low-fat, or fat-free cheeses. Nonfat, low-sodium ricotta or cottage cheese. Low-fat or nonfat yogurt. Low-fat, low-sodium cheese. Fats and oils Soft margarine without trans fats. Vegetable oil. Reduced-fat, low-fat, or light mayonnaise and salad  dressings (reduced-sodium). Canola, safflower, olive, avocado, soybean, and sunflower oils. Avocado. Seasonings and condiments Herbs. Spices. Seasoning mixes without salt. Other foods Unsalted popcorn and pretzels. Fat-free sweets. The items listed above may not be all the foods and drinks you can have. Talk to a dietitian to learn more. What foods should I avoid? Fruits Canned fruit in a light or heavy syrup. Fried fruit. Fruit in cream or butter sauce. Vegetables Creamed or fried vegetables. Vegetables in a cheese sauce. Regular canned vegetables that are not marked as low-sodium or reduced-sodium. Regular canned tomato sauce and paste that are not marked as low-sodium or reduced-sodium. Regular tomato and vegetable juices that are not marked as low-sodium or reduced-sodium. Pickles. Olives. Grains Baked goods made with fat, such as croissants, muffins, or some breads. Dry pasta or rice meal packs. Meats and other proteins Fatty cuts of meat. Ribs. Fried meat. Bacon. Bologna, salami, and other precooked or cured meats, such as sausages or meat loaves, that are not lean and low in sodium. Fat from the back of a pig (fatback). Bratwurst. Salted nuts and seeds. Canned beans with added salt. Canned   or smoked fish. Whole eggs or egg yolks. Chicken or turkey with skin. Dairy Whole or 2% milk, cream, and half-and-half. Whole or full-fat cream cheese. Whole-fat or sweetened yogurt. Full-fat cheese. Nondairy creamers. Whipped toppings. Processed cheese and cheese spreads. Fats and oils Butter. Stick margarine. Lard. Shortening. Ghee. Bacon fat. Tropical oils, such as coconut, palm kernel, or palm oil. Seasonings and condiments Onion salt, garlic salt, seasoned salt, table salt, and sea salt. Worcestershire sauce. Tartar sauce. Barbecue sauce. Teriyaki sauce. Soy sauce, including reduced-sodium soy sauce. Steak sauce. Canned and packaged gravies. Fish sauce. Oyster sauce. Cocktail sauce. Store-bought  horseradish. Ketchup. Mustard. Meat flavorings and tenderizers. Bouillon cubes. Hot sauces. Pre-made or packaged marinades. Pre-made or packaged taco seasonings. Relishes. Regular salad dressings. Other foods Salted popcorn and pretzels. The items listed above may not be all the foods and drinks you should avoid. Talk to a dietitian to learn more. Where to find more information National Heart, Lung, and Blood Institute (NHLBI): nhlbi.nih.gov American Heart Association (AHA): heart.org Academy of Nutrition and Dietetics: eatright.org National Kidney Foundation (NKF): kidney.org This information is not intended to replace advice given to you by your health care provider. Make sure you discuss any questions you have with your health care provider. Document Revised: 12/24/2022 Document Reviewed: 12/24/2022 Elsevier Patient Education  2024 Elsevier Inc.  

## 2023-08-24 ENCOUNTER — Encounter: Payer: Self-pay | Admitting: Nurse Practitioner

## 2023-08-24 ENCOUNTER — Ambulatory Visit (INDEPENDENT_AMBULATORY_CARE_PROVIDER_SITE_OTHER): Payer: Medicare Other | Admitting: Nurse Practitioner

## 2023-08-24 VITALS — BP 138/86 | HR 78 | Temp 98.0°F | Resp 20 | Ht 62.0 in | Wt 165.6 lb

## 2023-08-24 DIAGNOSIS — I10 Essential (primary) hypertension: Secondary | ICD-10-CM | POA: Diagnosis not present

## 2023-08-24 NOTE — Assessment & Plan Note (Signed)
Chronic, ongoing.  BP slightly above goal, but she has missed some doses due to dental work.  Will continues Losartan 100 MG daily and reiterated importance of taking this daily to prevent BP elevations which could lead to acute stroke.  Recommend continue to monitor BP daily at home and notify provider if consistent above 130/80.  Labs: up to date.  Recommend focus on DASH diet and modest weight loss.  Continue collaboration with cardiology, appreciate their input.  Urine ALB 30 June 2023.  Return in February for 6 month follow-up.

## 2023-08-24 NOTE — Progress Notes (Signed)
BP 138/86 (BP Location: Left Arm, Patient Position: Sitting, Cuff Size: Normal)   Pulse 78   Temp 98 F (36.7 C) (Oral)   Resp 20   Ht 5\' 2"  (1.575 m)   Wt 165 lb 9.6 oz (75.1 kg)   LMP  (LMP Unknown)   SpO2 98%   BMI 30.29 kg/m    Subjective:    Patient ID: Lisa Bass, female    DOB: 1951-06-09, 72 y.o.   MRN: [id number]  HPI: Lisa Bass is a 72 y.o. female  Chief Complaint  Patient presents with   Hypertension   HYPERTENSION without Chronic Kidney Disease Presents today for follow-up on BP with restart of medications.  Taking Losartan 100 MG daily, missed only a couple doses with dental work.   Hypertension status: uncontrolled  Satisfied with current treatment? yes Duration of hypertension: chronic BP monitoring frequency:  a few times a week BP range: 140/80 last night BP medication side effects:  no Medication compliance: good compliance Aspirin: yes Recurrent headaches: no Visual changes: no Palpitations: no Dyspnea: no Chest pain: no Lower extremity edema: no Dizzy/lightheaded: no   Relevant past medical, surgical, family and social history reviewed and updated as indicated. Interim medical history since our last visit reviewed. Allergies and medications reviewed and updated.  Review of Systems  Constitutional:  Negative for activity change, appetite change, diaphoresis, fatigue and fever.  Respiratory:  Negative for cough, chest tightness and shortness of breath.   Cardiovascular:  Negative for chest pain, palpitations and leg swelling.  Gastrointestinal: Negative.   Neurological: Negative.   Psychiatric/Behavioral: Negative.      Per HPI unless specifically indicated above     Objective:    BP 138/86 (BP Location: Left Arm, Patient Position: Sitting, Cuff Size: Normal)   Pulse 78   Temp 98 F (36.7 C) (Oral)   Resp 20   Ht 5\' 2"  (1.575 m)   Wt 165 lb 9.6 oz (75.1 kg)   LMP  (LMP Unknown)   SpO2 98%   BMI 30.29 kg/m   Wt Readings  from Last 3 Encounters:  08/24/23 165 lb 9.6 oz (75.1 kg)  07/20/23 169 lb 9.6 oz (76.9 kg)  11/10/21 184 lb 6.6 oz (83.6 kg)    Physical Exam Vitals and nursing note reviewed.  Constitutional:      General: She is awake. She is not in acute distress.    Appearance: She is well-developed and well-groomed. She is obese. She is not ill-appearing.  HENT:     Head: Normocephalic.     Right Ear: Hearing normal.     Left Ear: Hearing normal.  Eyes:     General: Lids are normal.        Right eye: No discharge.        Left eye: No discharge.     Conjunctiva/sclera: Conjunctivae normal.     Pupils: Pupils are equal, round, and reactive to light.  Neck:     Thyroid: No thyromegaly.     Vascular: No carotid bruit.  Cardiovascular:     Rate and Rhythm: Normal rate and regular rhythm.     Heart sounds: Normal heart sounds. No murmur heard.    No gallop.  Pulmonary:     Effort: Pulmonary effort is normal. No accessory muscle usage or respiratory distress.     Breath sounds: Normal breath sounds.  Abdominal:     General: Bowel sounds are normal.     Palpations: Abdomen is soft.  Musculoskeletal:     Cervical back: Normal range of motion and neck supple.     Right lower leg: No edema.     Left lower leg: No edema.  Skin:    General: Skin is warm and dry.  Neurological:     Mental Status: She is alert and oriented to person, place, and time.  Psychiatric:        Attention and Perception: Attention normal.        Mood and Affect: Mood normal.        Speech: Speech normal.        Behavior: Behavior normal. Behavior is cooperative.        Thought Content: Thought content normal.    Results for orders placed or performed in visit on 07/20/23  Bayer DCA Hb A1c Waived  Result Value Ref Range   HB A1C (BAYER DCA - WAIVED) 6.3 (H) 4.8 - 5.6 %  Microalbumin, Urine Waived  Result Value Ref Range   Microalb, Ur Waived 10 0 - 19 mg/L   Creatinine, Urine Waived 50 10 - 300 mg/dL    Microalb/Creat Ratio <30 <30 mg/g  T4, free  Result Value Ref Range   Free T4 1.13 0.82 - 1.77 ng/dL  CBC with Differential/Platelet  Result Value Ref Range   WBC 7.8 3.4 - 10.8 x10E3/uL   RBC 4.80 3.77 - 5.28 x10E6/uL   Hemoglobin 12.9 11.1 - 15.9 g/dL   Hematocrit 16.1 09.6 - 46.6 %   MCV 82 79 - 97 fL   MCH 26.9 26.6 - 33.0 pg   MCHC 32.8 31.5 - 35.7 g/dL   RDW 04.5 40.9 - 81.1 %   Platelets 254 150 - 450 x10E3/uL   Neutrophils 64 Not Estab. %   Lymphs 26 Not Estab. %   Monocytes 5 Not Estab. %   Eos 3 Not Estab. %   Basos 1 Not Estab. %   Neutrophils Absolute 5.0 1.4 - 7.0 x10E3/uL   Lymphocytes Absolute 2.1 0.7 - 3.1 x10E3/uL   Monocytes Absolute 0.4 0.1 - 0.9 x10E3/uL   EOS (ABSOLUTE) 0.3 0.0 - 0.4 x10E3/uL   Basophils Absolute 0.1 0.0 - 0.2 x10E3/uL   Immature Granulocytes 1 Not Estab. %   Immature Grans (Abs) 0.1 0.0 - 0.1 x10E3/uL  Comprehensive metabolic panel  Result Value Ref Range   Glucose 94 70 - 99 mg/dL   BUN 11 8 - 27 mg/dL   Creatinine, Ser 9.14 0.57 - 1.00 mg/dL   eGFR 66 >78 GN/FAO/1.30   BUN/Creatinine Ratio 12 12 - 28   Sodium 141 134 - 144 mmol/L   Potassium 4.4 3.5 - 5.2 mmol/L   Chloride 103 96 - 106 mmol/L   CO2 25 20 - 29 mmol/L   Calcium 10.0 8.7 - 10.3 mg/dL   Total Protein 7.5 6.0 - 8.5 g/dL   Albumin 4.2 3.8 - 4.8 g/dL   Globulin, Total 3.3 1.5 - 4.5 g/dL   Bilirubin Total 0.5 0.0 - 1.2 mg/dL   Alkaline Phosphatase 116 44 - 121 IU/L   AST 24 0 - 40 IU/L   ALT 16 0 - 32 IU/L  Lipid Panel w/o Chol/HDL Ratio  Result Value Ref Range   Cholesterol, Total 218 (H) 100 - 199 mg/dL   Triglycerides 865 0 - 149 mg/dL   HDL 52 >78 mg/dL   VLDL Cholesterol Cal 20 5 - 40 mg/dL   LDL Chol Calc (NIH) 469 (H) 0 - 99 mg/dL  TSH  Result Value Ref Range   TSH 3.050 0.450 - 4.500 uIU/mL  VITAMIN D 25 Hydroxy (Vit-D Deficiency, Fractures)  Result Value Ref Range   Vit D, 25-Hydroxy 16.3 (L) 30.0 - 100.0 ng/mL  Thyroid peroxidase antibody  Result  Value Ref Range   Thyroperoxidase Ab SerPl-aCnc 14 0 - 34 IU/mL  Vitamin B12  Result Value Ref Range   Vitamin B-12 374 232 - 1,245 pg/mL      Assessment & Plan:   Problem List Items Addressed This Visit       Cardiovascular and Mediastinum   Hypertension - Primary    Chronic, ongoing.  BP slightly above goal, but she has missed some doses due to dental work.  Will continues Losartan 100 MG daily and reiterated importance of taking this daily to prevent BP elevations which could lead to acute stroke.  Recommend continue to monitor BP daily at home and notify provider if consistent above 130/80.  Labs: up to date.  Recommend focus on DASH diet and modest weight loss.  Continue collaboration with cardiology, appreciate their input.  Urine ALB 30 June 2023.  Return in February for 6 month follow-up.        Follow up plan: Return in about 5 months (around 01/24/2024) for HTN/HLD, MOOD, IFG, OSTEOPENIA.

## 2023-09-06 ENCOUNTER — Telehealth: Payer: Self-pay | Admitting: Nurse Practitioner

## 2023-09-06 NOTE — Telephone Encounter (Signed)
Called and spoke to patient. Patient states she has been taking 50 mg of Losartan. Explained that according to her chart, we have that she has been taking 100 mg tablets since November of 2022. Medication was increased from 50 mg to 100 mg at this visit. Contacted [facility] and patient was given 100 mg tablets, 90 day supply, on 07/20/2023.   Patient wants to know if Jolene wants her to continue the 50 mg or the 100 mg. Patient states she her BP this morning was 149.

## 2023-09-06 NOTE — Telephone Encounter (Unsigned)
Copied from CRM [id number]. Topic: General - Other >> Sep 06, 2023 11:31 AM Dondra Prader E wrote: Reason for CRM: Pt called reporting that she was unaware that her PCP increased her dose of losartan from 50 to 100 MG. Has questions for PCP

## 2023-09-07 NOTE — Telephone Encounter (Signed)
Copied from CRM [id number]. Topic: General - Other >> Sep 06, 2023  5:15 PM Everette C wrote: Reason for CRM: The patient has returned a missed call from B. Russell   Please contact further when possible

## 2023-09-07 NOTE — Telephone Encounter (Signed)
Spoke to patient and gave Jolene's comments from 09/06/23. Patient verbalized understanding.   Aura Dials T, NP    09/06/23  5:02 PM She should be taking 100 MG and if elevations she needs to come on back in so we can check.  Goal is to have less then 130/80.

## 2023-09-07 NOTE — Telephone Encounter (Signed)
Called and LVM asking for patient to please return my call.   OK for PEC to speak to patient and advise her of Jolene's message regarding the medication.

## 2023-09-28 DIAGNOSIS — S92355A Nondisplaced fracture of fifth metatarsal bone, left foot, initial encounter for closed fracture: Secondary | ICD-10-CM | POA: Diagnosis not present

## 2023-10-08 ENCOUNTER — Other Ambulatory Visit: Payer: Self-pay | Admitting: Nurse Practitioner

## 2023-10-08 ENCOUNTER — Ambulatory Visit: Payer: Self-pay

## 2023-10-08 ENCOUNTER — Ambulatory Visit (INDEPENDENT_AMBULATORY_CARE_PROVIDER_SITE_OTHER): Payer: Medicare Other | Admitting: Physician Assistant

## 2023-10-08 VITALS — BP 150/96 | HR 99 | Temp 96.9°F | Resp 16 | Ht 62.0 in | Wt 170.7 lb

## 2023-10-08 DIAGNOSIS — L309 Dermatitis, unspecified: Secondary | ICD-10-CM

## 2023-10-08 MED ORDER — NYSTATIN 100000 UNIT/GM EX CREA: 1.0000 | TOPICAL_CREAM | Freq: Two times a day (BID) | CUTANEOUS | 0 refills | Status: DC

## 2023-10-08 MED ORDER — TRIAMCINOLONE ACETONIDE 0.1 % EX CREA: 1.0000 | TOPICAL_CREAM | Freq: Two times a day (BID) | CUTANEOUS | 0 refills | Status: DC

## 2023-10-08 NOTE — Telephone Encounter (Signed)
Summary: Rash on stomach and arms   The patient called in stating she has had a rash on her stomach and arms. She states it has been a few days and reports no other symptoms including pain, swelling or shortness of breath. Please assist patient further.       Chief Complaint: Rash to underarms and abdomen. Itchy, no pain. Symptoms: "Looks like heat rash." Frequency: 2 days ago Pertinent Negatives: Patient denies pain Disposition: [] ED /[] Urgent Care (no appt availability in office) / [x] Appointment(In office/virtual)/ []  [facility]/ [] Home Care/ [] Refused Recommended Disposition /[] [facility] [facility]/ []  Follow-up with PCP Additional Notes: Pt. Agrees with appointment.  Reason for Disposition  SEVERE itching (i.e., interferes with sleep, normal activities or school)  Answer Assessment - Initial Assessment Questions 1. APPEARANCE of RASH: "Describe the rash." (e.g., spots, blisters, raised areas, skin peeling, scaly)     Under the skin 2. SIZE: "How big are the spots?" (e.g., tip of pen, eraser, coin; inches, centimeters)     N/a 3. LOCATION: "Where is the rash located?"     Stomach, arm pits 4. COLOR: "What color is the rash?" (Note: It is difficult to assess rash color in people with darker-colored skin. When this situation occurs, simply ask the caller to describe what they see.)     Light color 5. ONSET: "When did the rash begin?"     2 days ago 6. FEVER: "Do you have a fever?" If Yes, ask: "What is your temperature, how was it measured, and when did it start?"     No 7. ITCHING: "Does the rash itch?" If Yes, ask: "How bad is the itch?" (Scale 1-10; or mild, moderate, severe)     Was itchy 8. CAUSE: "What do you think is causing the rash?"     Unsure 9. MEDICINE FACTORS: "Have you started any new medicines within the last 2 weeks?" (e.g., antibiotics)      No 10. OTHER SYMPTOMS: "Do you have any other symptoms?" (e.g., dizziness, headache, sore throat, joint  pain)       No 11. PREGNANCY: "Is there any chance you are pregnant?" "When was your last menstrual period?"       No  Protocols used: Rash or Redness - [facility]

## 2023-10-08 NOTE — Progress Notes (Signed)
Acute Office Visit   Patient: Lisa Bass   DOB: 04-05-1951   72 y.o. Female  MRN: [id number] Visit Date: 10/08/2023  Today's healthcare provider: Oswaldo Conroy Analyah Mcconnon, PA-C  Introduced myself to the patient as a Secondary school teacher and provided education on APPs in clinical practice.    Chief Complaint  Patient presents with   Rash    rash on her stomach and arms. has been a few days and reports no other symptoms including pain, swelling or shortness of breath. Only itchiness   Subjective    HPI HPI     Rash    Additional comments: rash on her stomach and arms. has been a few days and reports no other symptoms including pain, swelling or shortness of breath. Only itchiness      Last edited by Forde Radon, CMA on 10/08/2023  3:35 PM.       RASH Duration:  days - started 2-3 days ago  Location: trunk (under breasts, along upper abdomen) and axilla  Itching: yes Burning: no Redness: yes Oozing: no Scaling: no Blisters: no Painful: no- but it is uncomfortable Fevers: no Change in detergents/soaps/personal care products: no Recent illness: no Recent travel:no History of same: no Context: fluctuating She denies similar symptoms in others in household or around her  Alleviating factors: nothing Treatments attempted:lotion/moisturizer- Gold bond triple action anti itch cream  Shortness of breath: no  Throat/tongue swelling: no Myalgias/arthralgias: no    Medications: Outpatient Medications Prior to Visit  Medication Sig   aspirin 81 MG tablet Take 81 mg by mouth daily.   cholecalciferol (VITAMIN D3) 25 MCG (1000 UT) tablet Take 1,000 Units by mouth daily.   losartan (COZAAR) 100 MG tablet Take 1 tablet (100 mg total) by mouth daily.   Multiple Vitamin (MULTIVITAMIN) tablet Take 1 tablet by mouth daily.   PARoxetine (PAXIL) 40 MG tablet Take 0.5 tablets (20 mg total) by mouth daily. Take 1/2 dose   rosuvastatin (CRESTOR) 10 MG tablet Take 1 tablet (10 MG) by  mouth on Monday, Wednesday, and Friday.   vitamin B-12 (CYANOCOBALAMIN) 1000 MCG tablet Take 1,000 mcg by mouth daily.   No facility-administered medications prior to visit.    Review of Systems  Constitutional:  Negative for chills and fever.  Respiratory:  Negative for shortness of breath and wheezing.   Skin:  Positive for color change and rash.        Objective    BP (!) 150/96   Pulse 99   Temp (!) 96.9 F (36.1 C) (Temporal)   Resp 16   Ht 5\' 2"  (1.575 m)   Wt 170 lb 11.2 oz (77.4 kg)   LMP  (LMP Unknown)   SpO2 99%   BMI 31.22 kg/m     Physical Exam Vitals reviewed.  Constitutional:      General: She is awake.     Appearance: Normal appearance. She is well-developed and well-groomed.  HENT:     Head: Normocephalic and atraumatic.  Pulmonary:     Effort: Pulmonary effort is normal.  Skin:    General: Skin is warm and dry.     Findings: Rash present.     Comments: Macular erythematous rash along upper abdomen/lower portion of chest and in bilateral axilla  No increased warmth, bullae, vesicles, drainage, swelling noted on exam  Neurological:     Mental Status: She is alert.  Psychiatric:        Behavior: Behavior  is cooperative.       No results found for any visits on 10/08/23.  Assessment & Plan      No follow-ups on file.     Problem List Items Addressed This Visit   None Visit Diagnoses     Dermatitis    -  Primary Acute, new concern Patient reports erythematous macular rash along upper abdomen/lower portion of chest as well as bilateral axilla for the past several days. She started using Goldbond anti-itch cream earlier today but is not sure if it is provided much relief Rash appears consistent with dermatitis but cannot rule out early Candida infection at this time Will provide Kenalog cream to assist with itching as well as nystatin cream for potential yeast infection Recommend that she starts taking a daily antihistamine to assist with  itching Reviewed that if her symptoms are not improving in the next 2 weeks or if they are getting worse that she should follow-up in the office for further evaluation and management   Relevant Medications   nystatin cream (MYCOSTATIN)   triamcinolone cream (KENALOG) 0.1 %        No follow-ups on file.   I, Alayzha An E Chi Woodham, PA-C, have reviewed all documentation for this visit. The documentation on 10/08/23 for the exam, diagnosis, procedures, and orders are all accurate and complete.   Jacquelin Hawking, MHS, PA-C [facility]

## 2023-10-08 NOTE — Telephone Encounter (Signed)
Requested Prescriptions  Pending Prescriptions Disp Refills   PARoxetine (PAXIL) 20 MG tablet [Pharmacy Med Name: PAROXETINE HCL 20 MG TAB] 30 tablet     Sig: TAKE 1 TABLET BY MOUTH ONCE DAILY     Psychiatry:  Antidepressants - SSRI Passed - 10/08/2023  4:52 PM      Passed - Completed PHQ-2 or PHQ-9 in the last 360 days      Passed - Valid encounter within last 6 months    Recent Outpatient Visits           Today Dermatitis   [place] [facility] Mecum, Oswaldo Conroy, PA-C   1 month ago Primary hypertension   [place] [facility] [place], Clinton T, NP   2 months ago Medicare annual wellness visit, subsequent   [place] [facility] [place], [place] T, NP   9 months ago Upper respiratory tract infection, unspecified type   [place] [facility] Mecum, Oswaldo Conroy, PA-C   1 year ago Primary hypertension   [place] [facility] [place], Dorie Rank, NP       Future Appointments             In 4 months Cannady, Dorie Rank, NP [place] [facility]

## 2023-10-08 NOTE — Patient Instructions (Signed)
I also recommend adding an antihistamine to your daily regimen This includes medications like Claritin, Allegra, Zyrtec- the generics of these work very well and are usually less expensive This can help with the itching I have sent in a script for a steroid cream - Kenalog - to help with the itching You can use this as needed up to 2 times per day  I have also sent in a script for Nystatin which is helpful for yeast infections - please apply this twice a day   If your symptoms are not improving in the next 2 weeks or getting worse please let us know.

## 2023-10-28 ENCOUNTER — Ambulatory Visit: Payer: Self-pay

## 2023-10-28 NOTE — Telephone Encounter (Signed)
Chief Complaint: Cough Symptoms: Productive Cough, runny nose, congestion, sore throat, hoarse voice, upset stomach Frequency: constant  Pertinent Negatives: Patient denies fever, Covid, chest pain  Disposition: [] ED /[] Urgent Care (no appt availability in office) / [x] Appointment(In office/virtual)/ []  [place] [facility]/ [] Home Care/ [] Refused Recommended Disposition /[] [place] [facility]/ []  Follow-up with PCP Additional Notes: Patient stated she has had a productive cough, congestion, runny nose, upset stomach, and hoarse voice. Patient stated she is taking over the counter medication but symptoms are not improving. Care advice given and patient has been scheduled to be seen tomorrow at 1100. Summary: COVID Symptoms no COVID   Pt is calling in because she is has a runny nose, sore throat, and a horrible cough. Pt says she doesn't have a fever but her stomach is slightly upset. Pt says she took a COVID test and it's negative. Pt wants advice from a nurse on what she should do.     Reason for Disposition  [1] Continuous (nonstop) coughing interferes with work or school AND [2] no improvement using cough treatment per Care Advice  Answer Assessment - Initial Assessment Questions 1. ONSET: "When did the cough begin?"      Few days ago  2. SEVERITY: "How bad is the cough today?"      Moderate 3. SPUTUM: "Describe the color of your sputum" (none, dry cough; clear, white, yellow, green)     white 4. HEMOPTYSIS: "Are you coughing up any blood?" If so ask: "How much?" (flecks, streaks, tablespoons, etc.)     No 5. DIFFICULTY BREATHING: "Are you having difficulty breathing?" If Yes, ask: "How bad is it?" (e.g., mild, moderate, severe)    - MILD: No SOB at rest, mild SOB with walking, speaks normally in sentences, can lie down, no retractions, pulse < 100.    - MODERATE: SOB at rest, SOB with minimal exertion and prefers to sit, cannot lie down flat, speaks in phrases, mild retractions,  audible wheezing, pulse 100-120.    - SEVERE: Very SOB at rest, speaks in single words, struggling to breathe, sitting hunched forward, retractions, pulse > 120      Mid 6. FEVER: "Do you have a fever?" If Yes, ask: "What is your temperature, how was it measured, and when did it start?"     No fever 7. CARDIAC HISTORY: "Do you have any history of heart disease?" (e.g., heart attack, congestive heart failure)      HTN 8. LUNG HISTORY: "Do you have any history of lung disease?"  (e.g., pulmonary embolus, asthma, emphysema)     None 9. PE RISK FACTORS: "Do you have a history of blood clots?" (or: recent major surgery, recent prolonged travel, bedridden)     No 10. OTHER SYMPTOMS: "Do you have any other symptoms?" (e.g., runny nose, wheezing, chest pain)       Cough, runny nose, congestion sore throat, hoarse voice, upset stomach  Protocols used: Cough - Acute Productive-A-AH

## 2023-10-29 ENCOUNTER — Ambulatory Visit (INDEPENDENT_AMBULATORY_CARE_PROVIDER_SITE_OTHER): Payer: Medicare Other | Admitting: Family Medicine

## 2023-10-29 VITALS — BP 150/96 | HR 110 | Ht 61.02 in | Wt 165.6 lb

## 2023-10-29 DIAGNOSIS — J069 Acute upper respiratory infection, unspecified: Secondary | ICD-10-CM

## 2023-10-29 DIAGNOSIS — S92355A Nondisplaced fracture of fifth metatarsal bone, left foot, initial encounter for closed fracture: Secondary | ICD-10-CM | POA: Diagnosis not present

## 2023-10-29 LAB — VERITOR FLU A/B WAIVED
Influenza A: NEGATIVE
Influenza B: NEGATIVE

## 2023-10-29 MED ORDER — BENZONATATE 100 MG PO CAPS
100.0000 mg | ORAL_CAPSULE | Freq: Two times a day (BID) | ORAL | 0 refills | Status: DC | PRN
Start: 2023-10-29 — End: 2024-02-22

## 2023-10-29 NOTE — Patient Instructions (Addendum)
Take regular mucinex during the day  Take Coricidin HBP at night  Take Benzonatate for cough as needed  Try warm liquids such as hot tea/soup Tablespoon of honey  Plenty of fluids (water) [place] of rest

## 2023-10-29 NOTE — Assessment & Plan Note (Signed)
Acute, ongoing. Flu -, awaiting COVID. Recommend tessalon BID as needed for cough, symptom management with Coricidin HBP and mucinex alternatively, warm liquids such as hot tea/soup, plenty of fluids and rest. Call if no improvement over the next 3 days will treat with antibiotic.

## 2023-10-29 NOTE — Progress Notes (Signed)
BP (!) 150/96 Comment: Home BP 131/96  Pulse (!) 110   Ht 5' 1.02" (1.55 m)   Wt 165 lb 9.6 oz (75.1 kg)   LMP  (LMP Unknown)   SpO2 98%   BMI 31.27 kg/m    Subjective:    Patient ID: Lisa Bass, female    DOB: 05-22-1951, 72 y.o.   MRN: [id number]  HPI: MARGERET Bass is a 72 y.o. female  Chief Complaint  Patient presents with   Cough   Nasal Congestion   Patient checked her BP at home this morning and reading was 131/96, she admits to taking Losartan 100 MG daily.   UPPER RESPIRATORY TRACT INFECTION Symptoms started 4 days ago.  Worst symptom: Coughing Fever: no Cough: yes productive, waking her up at night Shortness of breath: no Wheezing: no Chest pain: no Chest tightness: no Chest congestion: no Nasal congestion: Yes Runny nose: yes Post nasal drip: no Sneezing: no Sore throat: yes Swollen glands: no Sinus pressure: yes Headache: yes Face pain: No Toothache: no Ear pain: no bilateral Ear pressure: Yes bilateral Eyes red/itching:no Eye drainage/crusting: Yes  Vomiting: no Rash: no Fatigue: no Sick contacts: no Strep contacts: no  Context:worse Recurrent sinusitis: no Relief with OTC cold/cough medications: no  Treatments attempted: Robitussin, Coricidin HBP taking now for 4 days  Relevant past medical, surgical, family and social history reviewed and updated as indicated. Interim medical history since our last visit reviewed. Allergies and medications reviewed and updated.  Review of Systems  Constitutional:  Negative for chills, fatigue and fever.  HENT:  Positive for congestion, rhinorrhea, sinus pressure and sore throat. Negative for ear pain, postnasal drip, sinus pain and sneezing.   Eyes:  Positive for discharge. Negative for redness and itching.  Respiratory:  Positive for cough. Negative for chest tightness, shortness of breath and wheezing.   Cardiovascular:  Negative for chest pain.  Gastrointestinal:  Negative for vomiting.   Skin:  Negative for rash.  Neurological:  Positive for headaches.    Per HPI unless specifically indicated above     Objective:    BP (!) 150/96 Comment: Home BP 131/96  Pulse (!) 110   Ht 5' 1.02" (1.55 m)   Wt 165 lb 9.6 oz (75.1 kg)   LMP  (LMP Unknown)   SpO2 98%   BMI 31.27 kg/m   Wt Readings from Last 3 Encounters:  10/29/23 165 lb 9.6 oz (75.1 kg)  10/08/23 170 lb 11.2 oz (77.4 kg)  08/24/23 165 lb 9.6 oz (75.1 kg)    Physical Exam Vitals and nursing note reviewed.  Constitutional:      General: She is not in acute distress.    Appearance: Normal appearance. She is not ill-appearing, toxic-appearing or diaphoretic.  HENT:     Head: Normocephalic and atraumatic.     Right Ear: Tympanic membrane, ear canal and external ear normal. There is no impacted cerumen. Tympanic membrane is not erythematous.     Left Ear: Tympanic membrane, ear canal and external ear normal. There is no impacted cerumen. Tympanic membrane is not erythematous.     Nose: Congestion and rhinorrhea present.     Right Turbinates: Swollen and pale.     Left Turbinates: Swollen and pale.     Right Sinus: No maxillary sinus tenderness or frontal sinus tenderness.     Left Sinus: No maxillary sinus tenderness or frontal sinus tenderness.     Mouth/Throat:     Mouth: Mucous membranes are  moist.     Pharynx: Oropharynx is clear. No oropharyngeal exudate or posterior oropharyngeal erythema.  Eyes:     General: No scleral icterus.       Right eye: No discharge.        Left eye: No discharge.     Extraocular Movements: Extraocular movements intact.     Conjunctiva/sclera: Conjunctivae normal.     Pupils: Pupils are equal, round, and reactive to light.  Neck:     Vascular: No carotid bruit.  Cardiovascular:     Rate and Rhythm: Normal rate and regular rhythm.     Pulses: Normal pulses.          Radial pulses are 2+ on the right side and 2+ on the left side.       Posterior tibial pulses are 2+ on  the right side and 2+ on the left side.     Heart sounds: Normal heart sounds. No murmur heard.    No friction rub. No gallop.  Pulmonary:     Effort: Pulmonary effort is normal. No respiratory distress.     Breath sounds: Normal breath sounds. No stridor. No wheezing, rhonchi or rales.  Chest:     Chest wall: No tenderness.  Musculoskeletal:        General: Normal range of motion.     Cervical back: Normal range of motion and neck supple. No rigidity. No muscular tenderness.  Lymphadenopathy:     Cervical: No cervical adenopathy.  Skin:    General: Skin is warm and dry.     Capillary Refill: Capillary refill takes less than 2 seconds.     Coloration: Skin is not jaundiced or pale.     Findings: No bruising, erythema, lesion or rash.  Neurological:     General: No focal deficit present.     Mental Status: She is alert and oriented to person, place, and time. Mental status is at baseline.     Cranial Nerves: No cranial nerve deficit.     Sensory: No sensory deficit.     Motor: No weakness.     Coordination: Coordination normal.     Gait: Gait normal.     Deep Tendon Reflexes: Reflexes normal.  Psychiatric:        Mood and Affect: Mood normal.        Behavior: Behavior normal.        Thought Content: Thought content normal.        Judgment: Judgment normal.     Results for orders placed or performed in visit on 10/29/23  Veritor Flu A/B Waived  Result Value Ref Range   Influenza A Negative Negative   Influenza B Negative Negative      Assessment & Plan:   Problem List Items Addressed This Visit     Acute URI - Primary    Acute, ongoing. Flu -, awaiting COVID. Recommend tessalon BID as needed for cough, symptom management with Coricidin HBP and mucinex alternatively, warm liquids such as hot tea/soup, plenty of fluids and rest. Call if no improvement over the next 3 days will treat with antibiotic.       Relevant Orders   Novel Coronavirus, NAA ([facility])   Veritor Flu  A/B Waived (Completed)     Follow up plan: Return if symptoms worsen or fail to improve.

## 2023-10-30 LAB — NOVEL CORONAVIRUS, NAA: SARS-CoV-2, NAA: NOT DETECTED

## 2023-11-01 ENCOUNTER — Ambulatory Visit: Payer: Self-pay

## 2023-11-01 MED ORDER — DOXYCYCLINE HYCLATE 100 MG PO TABS: 100.0000 mg | ORAL_TABLET | Freq: Two times a day (BID) | ORAL | 0 refills | Status: AC

## 2023-11-01 NOTE — Addendum Note (Signed)
Addended by: Prescott Gum on: 11/01/2023 08:56 PM   Modules accepted: Orders

## 2023-11-01 NOTE — Telephone Encounter (Signed)
Summary: Abx request, symptomatic   Pt called reporting that she has a lingering cough, she says her PCP told her to contact the clinic if her symptoms persisted so she could receive an abx. Please advise  [facility] [place] [place] - [address] [place] [address] Phone: [phone] Fax: [phone]     Chief Complaint: Seen 10/29/23 with cough. Told to call back if no better Monday "and she would call in an antibiotic." Continues to cough with light brown mucus. Symptoms: Above Frequency: 1 week Pertinent Negatives: Patient denies fever Disposition: [] ED /[] Urgent Care (no appt availability in office) / [] Appointment(In office/virtual)/ []  [place] [facility]/ [] Home Care/ [x] Refused Recommended Disposition /[] [place] [facility]/ [x]  Follow-up with PCP Additional Notes: Please advise pt.  Reason for Disposition  [1] Continuous (nonstop) coughing interferes with work or school AND [2] no improvement using cough treatment per Care Advice  Answer Assessment - Initial Assessment Questions 1. ONSET: "When did the cough begin?"      1 week ago 2. SEVERITY: "How bad is the cough today?"      severe 3. SPUTUM: "Describe the color of your sputum" (none, dry cough; clear, white, yellow, green)     Light brown 4. HEMOPTYSIS: "Are you coughing up any blood?" If so ask: "How much?" (flecks, streaks, tablespoons, etc.)     No 5. DIFFICULTY BREATHING: "Are you having difficulty breathing?" If Yes, ask: "How bad is it?" (e.g., mild, moderate, severe)    - MILD: No SOB at rest, mild SOB with walking, speaks normally in sentences, can lie down, no retractions, pulse < 100.    - MODERATE: SOB at rest, SOB with minimal exertion and prefers to sit, cannot lie down flat, speaks in phrases, mild retractions, audible wheezing, pulse 100-120.    - SEVERE: Very SOB at rest, speaks in single words, struggling to breathe, sitting hunched forward, retractions, pulse > 120       No 6. FEVER: "Do you have a fever?" If Yes, ask: "What is your temperature, how was it measured, and when did it start?"     No 7. CARDIAC HISTORY: "Do you have any history of heart disease?" (e.g., heart attack, congestive heart failure)      No 8. LUNG HISTORY: "Do you have any history of lung disease?"  (e.g., pulmonary embolus, asthma, emphysema)     No 9. PE RISK FACTORS: "Do you have a history of blood clots?" (or: recent major surgery, recent prolonged travel, bedridden)     No 10. OTHER SYMPTOMS: "Do you have any other symptoms?" (e.g., runny nose, wheezing, chest pain)       No 11. PREGNANCY: "Is there any chance you are pregnant?" "When was your last menstrual period?"       No 12. TRAVEL: "Have you traveled out of the country in the last month?" (e.g., travel history, exposures)       No  Protocols used: Cough - Acute Productive-A-AH

## 2023-11-02 ENCOUNTER — Encounter: Payer: Self-pay | Admitting: Family Medicine

## 2023-11-19 ENCOUNTER — Other Ambulatory Visit: Payer: Self-pay | Admitting: Nurse Practitioner

## 2023-11-23 NOTE — Telephone Encounter (Signed)
Rx was sent on 07/20/23 #45/4  Requested Prescriptions  Refused Prescriptions Disp Refills   PARoxetine (PAXIL) 20 MG tablet [Pharmacy Med Name: PAROXETINE HCL 20 MG TAB] 30 tablet     Sig: TAKE 1 TABLET BY MOUTH ONCE DAILY     Psychiatry:  Antidepressants - SSRI Passed - 11/19/2023  2:11 PM      Passed - Completed PHQ-2 or PHQ-9 in the last 360 days      Passed - Valid encounter within last 6 months    Recent Outpatient Visits           3 weeks ago Acute URI   [place] [facility] [place], Sherran Needs, NP   1 month ago Dermatitis   [facility] Mecum, Oswaldo Conroy, PA-C   3 months ago Primary hypertension   [place] [facility] [place], [place] T, NP   4 months ago Medicare annual wellness visit, subsequent   [place] [facility] [place], [place] T, NP   11 months ago Upper respiratory tract infection, unspecified type   [place] [facility] Mecum, Oswaldo Conroy, PA-C       Future Appointments             In 3 months Cannady, Dorie Rank, NP [place] [facility]

## 2024-02-20 NOTE — Patient Instructions (Signed)
Please call to schedule your mammogram and/or bone density: [facility] at [facility]  Address: [address], [place], [place] [address] Phone: [phone]  [facility] [address] [place],  [place]  [address] Phone: [phone]   Preventing High Cholesterol Cholesterol is a white, waxy substance similar to fat that the human body needs to help build cells. The liver makes all the cholesterol that a person's body needs. Having high cholesterol (hypercholesterolemia) increases your risk for heart disease and stroke. Extra or excess cholesterol comes from the food that you eat. High cholesterol can often be prevented with diet and lifestyle changes. If you already have high cholesterol, you can control it with diet, lifestyle changes, and medicines. How can high cholesterol affect me? If you have high cholesterol, fatty deposits (plaques) may build up on the walls of your blood vessels. The blood vessels that carry blood away from your heart are called arteries. Plaques make the arteries narrower and stiffer. This in turn can: Restrict or block blood flow and cause blood clots to form. Increase your risk for heart attack and stroke. What can increase my risk for high cholesterol? This condition is more likely to develop in people who: Eat foods that are high in saturated fat or cholesterol. Saturated fat is mostly found in foods that come from animal sources. Are overweight. Are not getting enough exercise. Use products that contain nicotine or tobacco, such as cigarettes, e-cigarettes, and chewing tobacco. Have a family history of high cholesterol (familial hypercholesterolemia). What actions can I take to prevent this? Nutrition  Eat less saturated fat. Avoid trans fats (partially hydrogenated oils). These are often found in margarine and in some baked goods, fried foods, and snacks bought in packages. Avoid precooked  or cured meat, such as bacon, sausages, or meat loaves. Avoid foods and drinks that have added sugars. Eat more fruits, vegetables, and whole grains. Choose healthy sources of protein, such as fish, poultry, lean cuts of red meat, beans, peas, lentils, and nuts. Choose healthy sources of fat, such as: Nuts. Vegetable oils, especially olive oil. Fish that have healthy fats, such as omega-3 fatty acids. These fish include mackerel or salmon. Lifestyle Lose weight if you are overweight. Maintaining a healthy body mass index (BMI) can help prevent or control high cholesterol. It can also lower your risk for diabetes and high blood pressure. Ask your health care provider to help you with a diet and exercise plan to lose weight safely. Do not use any products that contain nicotine or tobacco. These products include cigarettes, chewing tobacco, and vaping devices, such as e-cigarettes. If you need help quitting, ask your health care provider. Alcohol use Do not drink alcohol if: Your health care provider tells you not to drink. You are pregnant, may be pregnant, or are planning to become pregnant. If you drink alcohol: Limit how much you have to: 0-1 drink a day for women. 0-2 drinks a day for men. Know how much alcohol is in your drink. In the [place] one drink equals one 12 oz bottle of beer (355 mL), one 5 oz glass of wine (148 mL), or one 1 oz glass of hard liquor (44 mL). Activity  Get enough exercise. Do exercises as told by your health care provider. Each week, do at least 150 minutes of exercise that takes a medium level of effort (moderate-intensity exercise). This kind of exercise: Makes your heart beat faster while allowing you  to still be able to talk. Can be done in short sessions several times a day or longer sessions a few times a week. For example, on 5 days each week, you could walk fast or ride your bike 3 times a day for 10 minutes each time. Medicines Your health care provider  may recommend medicines to help lower cholesterol. This may be a medicine to lower the amount of cholesterol that your liver makes. You may need medicine if: Diet and lifestyle changes have not lowered your cholesterol enough. You have high cholesterol and other risk factors for heart disease or stroke. Take over-the-counter and prescription medicines only as told by your health care provider. General information Manage your risk factors for high cholesterol. Talk with your health care provider about all your risk factors and how to lower your risk. Manage other conditions that you have, such as diabetes or high blood pressure (hypertension). Have blood tests to check your cholesterol levels at regular points in time as told by your health care provider. Keep all follow-up visits. This is important. Where to find more information American Heart Association: www.heart.org National Heart, Lung, and Blood Institute: [employer].is Summary High cholesterol increases your risk for heart disease and stroke. By keeping your cholesterol level low, you can reduce your risk for these conditions. High cholesterol can often be prevented with diet and lifestyle changes. Work with your health care provider to manage your risk factors, and have your blood tested regularly. This information is not intended to replace advice given to you by your health care provider. Make sure you discuss any questions you have with your health care provider. Document Revised: 07/10/2022 Document Reviewed: 02/10/2021 Elsevier Patient Education  2024 [employer].

## 2024-02-22 ENCOUNTER — Ambulatory Visit: Payer: Medicare Other | Admitting: Nurse Practitioner

## 2024-02-22 ENCOUNTER — Ambulatory Visit: Payer: Self-pay | Admitting: Nurse Practitioner

## 2024-02-22 ENCOUNTER — Encounter: Payer: Self-pay | Admitting: Nurse Practitioner

## 2024-02-22 VITALS — BP 136/88 | HR 73 | Temp 98.2°F | Ht 61.4 in | Wt 165.0 lb

## 2024-02-22 DIAGNOSIS — I10 Essential (primary) hypertension: Secondary | ICD-10-CM

## 2024-02-22 DIAGNOSIS — F329 Major depressive disorder, single episode, unspecified: Secondary | ICD-10-CM | POA: Diagnosis not present

## 2024-02-22 DIAGNOSIS — E66811 Obesity, class 1: Secondary | ICD-10-CM

## 2024-02-22 DIAGNOSIS — M85852 Other specified disorders of bone density and structure, left thigh: Secondary | ICD-10-CM

## 2024-02-22 DIAGNOSIS — D170 Benign lipomatous neoplasm of skin and subcutaneous tissue of head, face and neck: Secondary | ICD-10-CM | POA: Diagnosis not present

## 2024-02-22 DIAGNOSIS — E538 Deficiency of other specified B group vitamins: Secondary | ICD-10-CM

## 2024-02-22 DIAGNOSIS — Z1231 Encounter for screening mammogram for malignant neoplasm of breast: Secondary | ICD-10-CM

## 2024-02-22 DIAGNOSIS — Z6833 Body mass index (BMI) 33.0-33.9, adult: Secondary | ICD-10-CM

## 2024-02-22 DIAGNOSIS — R7309 Other abnormal glucose: Secondary | ICD-10-CM

## 2024-02-22 DIAGNOSIS — E782 Mixed hyperlipidemia: Secondary | ICD-10-CM | POA: Diagnosis not present

## 2024-02-22 DIAGNOSIS — E6609 Other obesity due to excess calories: Secondary | ICD-10-CM

## 2024-02-22 DIAGNOSIS — G3184 Mild cognitive impairment, so stated: Secondary | ICD-10-CM

## 2024-02-22 LAB — MICROALBUMIN, URINE WAIVED
Creatinine, Urine Waived: 200 mg/dL (ref 10–300)
Microalb, Ur Waived: 30 mg/L — ABNORMAL HIGH (ref 0–19)
Microalb/Creat Ratio: <30 mg/g (ref <30)

## 2024-02-22 LAB — BAYER DCA HB A1C WAIVED: HB A1C (BAYER DCA - WAIVED): 5.3 % (ref 4.8–5.6)

## 2024-02-22 NOTE — Telephone Encounter (Signed)
 Copied from CRM [id number]. Topic: Clinical - Lab/Test Results >> Feb 22, 2024  4:07 PM Lisa Bass wrote: Reason for CRM: Patient has additional questions regarding lab results Reason for Disposition  Caller requesting lab results  (Exception: Routine or non-urgent lab result.)  Answer Assessment - Initial Assessment Questions 1. REASON FOR CALL or QUESTION: "What is your reason for calling today?" or "How can I best help you?" or "What question do you have that I can help answer?"     Why is microalbumin elevated?  Answer Assessment - Initial Assessment Questions 1. REASON FOR CALL or QUESTION: "What is your reason for calling today?" or "How can I best help you?" or "What question do you have that I can help answer?"     Why is microalbumin elevated 2. CALLER: Document the source of call. (e.g., laboratory, patient).     patient  Protocols used: Information Only Call - No Triage-A-AH, PCP Call - No Triage-A-AH

## 2024-02-22 NOTE — Assessment & Plan Note (Signed)
 Ongoing. Recommend she continue B12 supplement.  Check level today.

## 2024-02-22 NOTE — Assessment & Plan Note (Signed)
 Continues to be stable with no change in size or s/s infection.

## 2024-02-22 NOTE — Assessment & Plan Note (Signed)
BMI 30.77.  Recommended eating smaller high protein, low fat meals more frequently and exercising 30 mins a day 5 times a week with a goal of 10-15lb weight loss in the next 3 months. Patient voiced their understanding and motivation to adhere to these recommendations.

## 2024-02-22 NOTE — Assessment & Plan Note (Signed)
Ongoing.  Noted on DEXA June 2020.  Recommend she continue daily Vitamin D3 supplement and adequate calcium intake at home.  Check Vit D level today.  Repeat DEXA June 2025.

## 2024-02-22 NOTE — Assessment & Plan Note (Signed)
 A1c 5.3% today, trend down from 6.3% at recent visit.  Highly recommend continued focus on diet changes and regular activity.  If trend up to 6.5% or greater consider starting medication.

## 2024-02-22 NOTE — Assessment & Plan Note (Signed)
 Chronic, ongoing.  Recommend she continue Crestor 10 MG Monday/Wednesday/Friday schedule for prevention.  Obtain lipid panel today.

## 2024-02-22 NOTE — Progress Notes (Signed)
 BP 136/88 (BP Location: Left Arm, Patient Position: Sitting, Cuff Size: Normal) Comment: has not taken medication this morning  Pulse 73   Temp 98.2 F (36.8 C) (Oral)   Ht 5' 1.4" (1.56 m)   Wt 165 lb (74.8 kg)   LMP  (LMP Unknown)   SpO2 99%   BMI 30.77 kg/m    Subjective:    Patient ID: Lisa Bass, female    DOB: June 10, 1951, 73 y.o.   MRN: [id number]  HPI: Lisa Bass is a 73 y.o. female  Chief Complaint  Patient presents with   Depression   Hyperlipidemia   Hypertension   HYPERTENSION / HYPERLIPIDEMIA Taking Losartan daily + Rosuvastatin three days a week. 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  About to have dental work to bottom teeth, would like to have sedation, this is a stressor for her.  Has been eating poorly due to teeth, more mashed potatoes.  In past Lisinopril caused diarrhea. Satisfied with current treatment? yes Duration of hypertension: chronic BP monitoring frequency: daily for one week BP range: 130-140/70-80 -- has not taken medication yet this morning BP medication side effects: no Duration of hyperlipidemia: chronic Cholesterol medication side effects: no Cholesterol supplements: none Medication compliance: good compliance Aspirin: yes Recent stressors: no Recurrent headaches: no Visual changes: no Palpitations: no Dyspnea: no Chest pain: no Lower extremity edema: no Dizzy/lightheaded: no  The 10-year ASCVD risk score (Arnett DK, et al., 2019) is: 19.5%   Values used to calculate the score:     Age: 52 years     Sex: Female     Is Non-Hispanic African American: No     Diabetic: No     Tobacco smoker: No     Systolic Blood Pressure: 136 mmHg     Is BP treated: Yes     HDL Cholesterol: 52 mg/dL     Total Cholesterol: 218 mg/dL  OSTEOPENIA DEXA on 05/23/9527 = BMD measured at Femur Neck Left is 0.809 g/cm2 with a T-score of -1.6.  Satisfied with current treatment?: yes Past osteoporosis  medications/treatments: supplements Adequate calcium & vitamin D: not regularly Weight bearing exercises: yes   Impaired Fasting Glucose HbA1C:  Lab Results  Component Value Date   HGBA1C 6.3 (H) 07/20/2023  Duration of elevated blood sugar:  Polydipsia: no Polyuria: no Weight change: no Visual disturbance: no Glucose Monitoring: no    Accucheck frequency: Not Checking    Fasting glucose:     Post prandial:  Diabetic Education: Not Completed Family history of diabetes: yes - grandmother and sister   DEPRESSION Continues on Paxil 20 MG daily. Mood status: stable Satisfied with current treatment?: yes Symptom severity: mild Duration of current treatment : chronic Side effects: no Medication compliance: good compliance Psychotherapy/counseling: none Depressed mood: no Anxious mood: a little due to dentist Anhedonia: no Significant weight loss or gain: no Insomnia: none Fatigue: no Feelings of worthlessness or guilt: no Impaired concentration/indecisiveness: no Suicidal ideations: no Hopelessness: no Crying spells: no    02/22/2024    9:14 AM 10/29/2023   11:50 AM 10/08/2023    3:30 PM 08/24/2023   11:20 AM 07/20/2023    3:21 PM  Depression screen PHQ 2/9  Decreased Interest 0 1 0 0 1  Down, Depressed, Hopeless 1 1 0 0 0  PHQ - 2 Score 1 2 0 0 1  Altered sleeping 0 0 0 1 0  Tired, decreased energy 0  0 0 1 1  Change in appetite 0 1 0 0 0  Feeling bad or failure about yourself  0 0 0 0 1  Trouble concentrating 0 0 0 0 0  Moving slowly or fidgety/restless 0 0 0 1 0  Suicidal thoughts 0 0 0 0 0  PHQ-9 Score 1 3 0 3 3  Difficult doing work/chores Not difficult at all Somewhat difficult Not difficult at all  Not difficult at all       02/22/2024    9:14 AM 10/29/2023   11:50 AM 08/24/2023   11:21 AM 07/20/2023    3:21 PM  GAD 7 : Generalized Anxiety Score  Nervous, Anxious, on Edge 1 0 1 2  Control/stop worrying 0 0 1 2  Worry too much - different things 0 0 0 0   Trouble relaxing 0  0 0  Restless 0 0 0 0  Easily annoyed or irritable 1 0 0 0  Afraid - awful might happen 0 0 0 0  Total GAD 7 Score 2  2 4   Anxiety Difficulty Not difficult at all Not difficult at all Not difficult at all Not difficult at all   Relevant past medical, surgical, family and social history reviewed and updated as indicated. Interim medical history since our last visit reviewed. Allergies and medications reviewed and updated.  Review of Systems  Per HPI unless specifically indicated above     Objective:    BP 136/88 (BP Location: Left Arm, Patient Position: Sitting, Cuff Size: Normal) Comment: has not taken medication this morning  Pulse 73   Temp 98.2 F (36.8 C) (Oral)   Ht 5' 1.4" (1.56 m)   Wt 165 lb (74.8 kg)   LMP  (LMP Unknown)   SpO2 99%   BMI 30.77 kg/m   Wt Readings from Last 3 Encounters:  02/22/24 165 lb (74.8 kg)  10/29/23 165 lb 9.6 oz (75.1 kg)  10/08/23 170 lb 11.2 oz (77.4 kg)    Physical Exam Vitals and nursing note reviewed.  Constitutional:      General: She is awake. She is not in acute distress.    Appearance: She is well-developed and well-groomed. She is obese. She is not ill-appearing or toxic-appearing.  HENT:     Head: Normocephalic.     Right Ear: Hearing and external ear normal.     Left Ear: Hearing and external ear normal.  Eyes:     General: Lids are normal.        Right eye: No discharge.        Left eye: No discharge.     Conjunctiva/sclera: Conjunctivae normal.     Pupils: Pupils are equal, round, and reactive to light.  Neck:     Thyroid: No thyromegaly.     Vascular: No carotid bruit.  Cardiovascular:     Rate and Rhythm: Normal rate and regular rhythm.     Heart sounds: Normal heart sounds. No murmur heard.    No gallop.  Pulmonary:     Effort: Pulmonary effort is normal. No accessory muscle usage or respiratory distress.     Breath sounds: Normal breath sounds.  Abdominal:     General: Bowel sounds are  normal. There is no distension.     Palpations: Abdomen is soft.     Tenderness: There is no abdominal tenderness.  Musculoskeletal:     Cervical back: Normal range of motion and neck supple.     Right lower leg: No edema.  Left lower leg: No edema.  Lymphadenopathy:     Cervical: No cervical adenopathy.  Skin:    General: Skin is warm and dry.       Neurological:     Mental Status: She is alert and oriented to person, place, and time.     Deep Tendon Reflexes: Reflexes are normal and symmetric.     Reflex Scores:      Brachioradialis reflexes are 2+ on the right side and 2+ on the left side.      Patellar reflexes are 2+ on the right side and 2+ on the left side. Psychiatric:        Attention and Perception: Attention normal.        Mood and Affect: Mood normal.        Speech: Speech normal.        Behavior: Behavior normal. Behavior is cooperative.        Thought Content: Thought content normal.     Results for orders placed or performed in visit on 10/29/23  Veritor Flu A/B Waived   Collection Time: 10/29/23 11:25 AM  Result Value Ref Range   Influenza A Negative Negative   Influenza B Negative Negative  Novel Coronavirus, NAA ([facility])   Collection Time: 10/29/23 11:40 AM   Specimen: Nasopharyngeal(NP) swabs in vial transport medium  Result Value Ref Range   SARS-CoV-2, NAA Not Detected Not Detected      Assessment & Plan:   Problem List Items Addressed This Visit       Cardiovascular and Mediastinum   Hypertension   Chronic, ongoing.  BP above goal initial check, has not taken medication this morning, but trend down recheck.  Will continue Losartan 100 MG daily and reiterated importance of taking this daily to prevent BP elevations which could lead to acute stroke.  Recommend continue to monitor BP daily at home and notify provider if consistent above 130/80.  Labs: CMP.  Recommend focus on DASH diet and modest weight loss.  Continue collaboration with  cardiology, appreciate their input.  Urine ALB 28 February 2024.        Relevant Orders   Microalbumin, Urine Waived   Comprehensive metabolic panel     Musculoskeletal and Integument   Lipoma of neck   Continues to be stable with no change in size or s/s infection.      Osteopenia of neck of left femur   Ongoing.  Noted on DEXA June 2020.  Recommend she continue daily Vitamin D3 supplement and adequate calcium intake at home.  Check Vit D level today.  Repeat DEXA June 2025.      Relevant Orders   VITAMIN D 25 Hydroxy (Vit-D Deficiency, Fractures)   DG Bone Density     Other   B12 deficiency   Ongoing. Recommend she continue B12 supplement.  Check level today.      Relevant Orders   Vitamin B12   Elevated hemoglobin A1c   A1c 5.3% today, trend down from 6.3% at recent visit.  Highly recommend continued focus on diet changes and regular activity.  If trend up to 6.5% or greater consider starting medication.      Relevant Orders   Bayer DCA Hb A1c Waived   Microalbumin, Urine Waived   Hyperlipidemia   Chronic, ongoing.  Recommend she continue Crestor 10 MG Monday/Wednesday/Friday schedule for prevention.  Obtain lipid panel today.        Relevant Orders   Comprehensive metabolic panel   Lipid Panel  w/o Chol/HDL Ratio   Major depression, chronic - Primary   Chronic, stable.  Denies SI/HI.  Continue current medication regimen and adjust as needed.  Due to age would benefit from change to Zoloft or alternate SSRI in future and discontinuation Paxil, she is aware of this and wishes to maintain regimen at this time.        Obesity   BMI 30.77.  Recommended eating smaller high protein, low fat meals more frequently and exercising 30 mins a day 5 times a week with a goal of 10-15lb weight loss in the next 3 months. Patient voiced their understanding and motivation to adhere to these recommendations.       Other Visit Diagnoses       Encounter for screening mammogram for  malignant neoplasm of breast       Mammogram ordered and instructed how to schedule.   Relevant Orders   MM 3D SCREENING MAMMOGRAM BILATERAL BREAST        Follow up plan: Return in about 6 months (around 08/24/2024) for Annual Physical.

## 2024-02-22 NOTE — Assessment & Plan Note (Signed)
 Chronic, ongoing.  BP above goal initial check, has not taken medication this morning, but trend down recheck.  Will continue Losartan 100 MG daily and reiterated importance of taking this daily to prevent BP elevations which could lead to acute stroke.  Recommend continue to monitor BP daily at home and notify provider if consistent above 130/80.  Labs: CMP.  Recommend focus on DASH diet and modest weight loss.  Continue collaboration with cardiology, appreciate their input.  Urine ALB 28 February 2024.

## 2024-02-22 NOTE — Assessment & Plan Note (Signed)
Chronic, stable.  Denies SI/HI.  Continue current medication regimen and adjust as needed.  Due to age would benefit from change to Zoloft or alternate SSRI in future and discontinuation Paxil, she is aware of this and wishes to maintain regimen at this time.

## 2024-02-22 NOTE — Addendum Note (Signed)
 Addended by: Aura Dials T on: 02/22/2024 11:49 AM   Modules accepted: Level of Service

## 2024-02-23 ENCOUNTER — Encounter: Payer: Self-pay | Admitting: Nurse Practitioner

## 2024-02-23 ENCOUNTER — Other Ambulatory Visit: Payer: Self-pay | Admitting: Nurse Practitioner

## 2024-02-23 LAB — COMPREHENSIVE METABOLIC PANEL
ALT: 12 IU/L (ref 0–32)
AST: 21 IU/L (ref 0–40)
Albumin: 3.9 g/dL (ref 3.8–4.8)
Alkaline Phosphatase: 109 IU/L (ref 44–121)
BUN/Creatinine Ratio: 9 — ABNORMAL LOW (ref 12–28)
BUN: 9 mg/dL (ref 8–27)
Bilirubin Total: 0.5 mg/dL (ref 0.0–1.2)
CO2: 24 mmol/L (ref 20–29)
Calcium: 9.6 mg/dL (ref 8.7–10.3)
Chloride: 105 mmol/L (ref 96–106)
Creatinine, Ser: 0.99 mg/dL (ref 0.57–1.00)
Globulin, Total: 2.8 g/dL (ref 1.5–4.5)
Glucose: 95 mg/dL (ref 70–99)
Potassium: 4.7 mmol/L (ref 3.5–5.2)
Sodium: 143 mmol/L (ref 134–144)
Total Protein: 6.7 g/dL (ref 6.0–8.5)
eGFR: 60 mL/min/{1.73_m2} (ref >59)

## 2024-02-23 LAB — LIPID PANEL W/O CHOL/HDL RATIO
Cholesterol, Total: 194 mg/dL (ref 100–199)
HDL: 54 mg/dL (ref >39)
LDL Chol Calc (NIH): 122 mg/dL — ABNORMAL HIGH (ref 0–99)
Triglycerides: 101 mg/dL (ref 0–149)
VLDL Cholesterol Cal: 18 mg/dL (ref 5–40)

## 2024-02-23 LAB — VITAMIN B12: Vitamin B-12: 245 pg/mL (ref 232–1245)

## 2024-02-23 LAB — VITAMIN D 25 HYDROXY (VIT D DEFICIENCY, FRACTURES): Vit D, 25-Hydroxy: 9.9 ng/mL — ABNORMAL LOW (ref 30.0–100.0)

## 2024-02-23 MED ORDER — VITAMIN B-12 1000 MCG PO TABS: 1000.0000 ug | ORAL_TABLET | Freq: Every day | ORAL | 4 refills | Status: DC

## 2024-02-23 MED ORDER — CHOLECALCIFEROL 1.25 MG (50000 UT) PO TABS: 1.0000 | ORAL_TABLET | ORAL | 4 refills | Status: DC

## 2024-02-23 NOTE — Progress Notes (Signed)
 Contacted via MyChart   Good morning Lisa Bass, your labs have returned: - Kidney function, creatinine and eGFR, remains normal, as is liver function, AST and ALT.  - Lipid panel is showing elevation in LDL, I know you take Rosuvastatin 3 days a week, but I recommend adding on Zetia to go with this. It is not a statin and works a little differently, but with the Rosuvastatin may help in lowering LDL more.  Goal is <70.  I know I gave this to you once in past, but do not recall you having side effects. - Vitamin D and B12 levels remain quite low.  Please start taking supplements I send in, both are important.  Any questions? Keep being amazing!!  Thank you for allowing me to participate in your care.  I appreciate you. Kindest regards, Lakenzie Mcclafferty

## 2024-02-29 ENCOUNTER — Ambulatory Visit: Payer: Self-pay | Admitting: Nurse Practitioner

## 2024-02-29 MED ORDER — EZETIMIBE 10 MG PO TABS: 10.0000 mg | ORAL_TABLET | Freq: Every day | ORAL | 1 refills | Status: DC

## 2024-02-29 NOTE — Telephone Encounter (Signed)
  Chief Complaint: medication question Disposition: [] ED /[] Urgent Care (no appt availability in office) / [] Appointment(In office/virtual)/ []  [place]/ [] Home Care/ [] Refused Recommended Disposition /[] [facility]/ [x]  Follow-up with PCP Additional Notes: Patient called and was questioning about Zetia being sent to pharmacy.  Patient states she is ok with Zetia being call into [facility] on [address]   Copied from [facility] [id number]. Topic: Clinical - Medication Question >> Feb 29, 2024 12:03 PM Marland Kitchen D wrote: Patient called in to check the status of a prescription that is suppose to be filled. She don't know the spelling or name of it. Patient would like a call back. Reason for Disposition . Caller has medicine question only, adult not sick, AND triager answers question  Answer Assessment - Initial Assessment Questions 1. NAME of MEDICINE: "What medicine(s) are you calling about?"     Zetia 2. QUESTION: "What is your question?" (e.g., double dose of medicine, side effect)     Patient was question Zetia being sent in.   3. PRESCRIBER: "Who prescribed the medicine?" Reason: if prescribed by specialist, call should be referred to that group.     Jolene Cannady  Protocols used: Medication Question Call-A-AH

## 2024-02-29 NOTE — Telephone Encounter (Signed)
 Called and notified patient that medication has been sent in for her.

## 2024-04-27 ENCOUNTER — Other Ambulatory Visit: Payer: Self-pay

## 2024-04-27 ENCOUNTER — Ambulatory Visit: Payer: Self-pay

## 2024-04-27 ENCOUNTER — Emergency Department

## 2024-04-27 ENCOUNTER — Emergency Department
Admission: EM | Admit: 2024-04-27 | Discharge: 2024-04-27 | Disposition: A | Discharge status: Home / Self Care | Attending: Emergency Medicine | Admitting: Emergency Medicine

## 2024-04-27 DIAGNOSIS — R030 Elevated blood-pressure reading, without diagnosis of hypertension: Secondary | ICD-10-CM | POA: Diagnosis not present

## 2024-04-27 DIAGNOSIS — M7989 Other specified soft tissue disorders: Secondary | ICD-10-CM | POA: Diagnosis not present

## 2024-04-27 DIAGNOSIS — I10 Essential (primary) hypertension: Secondary | ICD-10-CM | POA: Diagnosis not present

## 2024-04-27 DIAGNOSIS — R519 Headache, unspecified: Secondary | ICD-10-CM | POA: Diagnosis not present

## 2024-04-27 LAB — CBC
HCT: 36.3 % (ref 36.0–46.0)
Hemoglobin: 12.2 g/dL (ref 12.0–15.0)
MCH: 28.8 pg (ref 26.0–34.0)
MCHC: 33.6 g/dL (ref 30.0–36.0)
MCV: 85.8 fL (ref 80.0–100.0)
Platelets: 175 10*3/uL (ref 150–400)
RBC: 4.23 MIL/uL (ref 3.87–5.11)
RDW: 13.2 % (ref 11.5–15.5)
WBC: 6.2 10*3/uL (ref 4.0–10.5)
nRBC: 0 % (ref 0.0–0.2)

## 2024-04-27 LAB — BASIC METABOLIC PANEL WITH GFR
Anion gap: 9 (ref 5–15)
BUN: 8 mg/dL (ref 8–23)
CO2: 27 mmol/L (ref 22–32)
Calcium: 9.3 mg/dL (ref 8.9–10.3)
Chloride: 106 mmol/L (ref 98–111)
Creatinine, Ser: 0.92 mg/dL (ref 0.44–1.00)
GFR, Estimated: >60 mL/min (ref >60)
Glucose, Bld: 112 mg/dL — ABNORMAL HIGH (ref 70–99)
Potassium: 3.5 mmol/L (ref 3.5–5.1)
Sodium: 142 mmol/L (ref 135–145)

## 2024-04-27 LAB — TROPONIN I (HIGH SENSITIVITY): Troponin I (High Sensitivity): 5 ng/L (ref <18)

## 2024-04-27 MED ORDER — AMLODIPINE BESYLATE 5 MG PO TABS: 5.0000 mg | ORAL_TABLET | Freq: Every day | ORAL | 1 refills | Status: DC

## 2024-04-27 MED ORDER — CLONIDINE HCL 0.1 MG PO TABS
0.1000 mg | ORAL_TABLET | Freq: Once | ORAL | Status: AC
  Administered 2024-04-27: 0.1 mg via ORAL
  Filled 2024-04-27: qty 1

## 2024-04-27 NOTE — ED Triage Notes (Signed)
 Patient was at the dentist for a procedure and was told to come to ED for evaluation of high blood pressure. Only complaining of bilateral leg swelling and headache.

## 2024-04-27 NOTE — ED Provider Notes (Signed)
 [facility] Provider Note    Event Date/Time   First MD Initiated Contact with Patient 04/27/24 1113     (approximate)   History   Hypertension   HPI  Lisa Bass is a 73 y.o. female with a history of hypertension, hyperlipidemia, depression who presents with elevated blood pressure readings over the last several days as high as the 200s systolic.  She was seen for dental procedure today which could not be completed due to the elevated blood pressure.  The patient reports some swelling in her feet but denies any chest pain or difficulty breathing.  She has a mild headache.  She denies feeling dizzy or lightheaded.  She states that she has been compliant with her losartan .  I reviewed the past medical records.  The patient's most recent outpatient encounter was with family medicine on 3/4 for follow-up of her chronic conditions.    Physical Exam   Triage Vital Signs: ED Triage Vitals  Encounter Vitals Group     BP 04/27/24 1052 (!) 210/94     Systolic BP Percentile --      Diastolic BP Percentile --      Pulse Rate 04/27/24 1052 66     Resp 04/27/24 1052 18     Temp 04/27/24 1052 98.3 F (36.8 C)     Temp Source 04/27/24 1052 Oral     SpO2 04/27/24 1052 100 %     Weight 04/27/24 1054 175 lb (79.4 kg)     Height 04/27/24 1054 5\' 1"  (1.549 m)     Head Circumference --      Peak Flow --      Pain Score 04/27/24 1055 4     Pain Loc --      Pain Education --      Exclude from Growth Chart --     Most recent vital signs: Vitals:   04/27/24 1052 04/27/24 1130  BP: (!) 210/94 (!) 174/78  Pulse: 66 (!) 31  Resp: 18 13  Temp: 98.3 F (36.8 C)   SpO2: 100% 91%     General: Alert, comfortable appearing, no distress.  CV:  Good peripheral perfusion.  Normal heart sounds. Resp:  Normal effort.  Lungs CTAB. Abd:  No distention.  Other:  No significant peripheral edema.   ED Results / Procedures / Treatments   Labs (all labs ordered are  listed, but only abnormal results are displayed) Labs Reviewed  BASIC METABOLIC PANEL WITH GFR - Abnormal; Notable for the following components:      Result Value   Glucose, Bld 112 (*)    All other components within normal limits  CBC  TROPONIN I (HIGH SENSITIVITY)     EKG  ED ECG REPORT I, Lind Repine, the attending physician, personally viewed and interpreted this ECG.  Date: 04/27/2024 EKG Time: 1056 Rate: 64 Rhythm: normal sinus rhythm (incorrectly read by machine as junctional rhythm) QRS Axis: normal Intervals: normal ST/T Wave abnormalities: normal Narrative Interpretation: no evidence of acute ischemia    RADIOLOGY  Chest x-ray: I independently viewed and interpreted the images; there is no focal consolidation or edema   PROCEDURES:  Critical Care performed: No  Procedures   MEDICATIONS ORDERED IN ED: Medications  cloNIDine (CATAPRES) tablet 0.1 mg (0.1 mg Oral Given 04/27/24 1159)     IMPRESSION / MDM / ASSESSMENT AND PLAN / ED COURSE  I reviewed the triage vital signs and the nursing notes.  73 year old female with PMH  as noted above presents with elevated blood pressure readings over the last several days.  She has a mild headache but no other significant symptoms.  On exam the blood pressure is elevated but other vital signs are normal.  Physical exam is otherwise unremarkable for acute findings.  Differential diagnosis includes, but is not limited to, exacerbation of chronic hypertension, hypertensive urgency or emergency.  We will obtain basic labs and troponin to rule out any evidence of end organ dysfunction.  I will give p.o. clonidine and reassess.  Patient's presentation is most consistent with exacerbation of chronic illness.  The patient is on the cardiac monitor to evaluate for evidence of arrhythmia and/or significant heart rate changes.   ----------------------------------------- 1:06 PM on  04/27/2024 -----------------------------------------  Lab workup is unremarkable.  BMP shows normal creatinine.  Troponin is negative.  The patient's blood pressure is improving with the clonidine, most recently 160 systolic.  At this time, she is stable for discharge home.  I have prescribed amlodipine  for her to start taking in addition to the losartan .  I recommended that she follow-up with her primary care doctor and keep a log of her blood pressures in the interim.  I gave strict return precautions and she expressed understanding.  FINAL CLINICAL IMPRESSION(S) / ED DIAGNOSES   Final diagnoses:  Hypertension, unspecified type     Rx / DC Orders   ED Discharge Orders          Ordered    amLODipine  (NORVASC ) 5 MG tablet  Daily        04/27/24 1305             Note:  This document was prepared using Dragon voice recognition software and may include unintentional dictation errors.    Lind Repine, MD 04/27/24 1306

## 2024-04-27 NOTE — Telephone Encounter (Signed)
 Copied from CRM [id number]. Topic: Clinical - Red Word Triage >> Apr 27, 2024 10:18 AM Lisa Bass wrote: Red Word that prompted transfer to Nurse Triage: Patient was going to a dental appt and the dentist took her blood pressure and it is in the 200 and refused to the procedure and suggest she call her pcp right away, Pt says it has been high for the last few days   Chief Complaint: High blood pressure  Symptoms: Headache, swelling of legs and feet  Frequency: Constant  Disposition: [x] ED /[] Urgent Care (no appt availability in office) / [] Appointment(In office/virtual)/ []  [place] [facility]/ [] Home Care/ [] Refused Recommended Disposition /[] [place] [facility]/ []  Follow-up with PCP Additional Notes: Patient reports at the dentists today her blood pressure was 208 systolic. She states that her blood pressures have been high for the last few days. Patient states she is also experiencing a headache and swelling of her legs and feet. Patient advised that with her symptoms and high blood pressure she should go to the ED for evaluation and treatment. Patient verbalized understanding of this plan.     Reason for Disposition  [1] Systolic BP  >= 160 OR Diastolic >= 100 AND [2] cardiac (e.g., breathing difficulty, chest pain) or neurologic symptoms (e.g., new-onset blurred or double vision, unsteady gait)  Answer Assessment - Initial Assessment Questions 1. BLOOD PRESSURE: "What is the blood pressure?" "Did you take at least two measurements 5 minutes apart?"     Systolic of 208 2. ONSET: "When did you take your blood pressure?"     Today at the dentist  3. HOW: "How did you take your blood pressure?" (e.g., automatic home BP monitor, visiting nurse)     Manual 4. HISTORY: "Do you have a history of high blood pressure?"     Yes 5. MEDICINES: "Are you taking any medicines for blood pressure?" "Have you missed any doses recently?"     Yes, no missed doses  6. OTHER SYMPTOMS: "Do you have any  symptoms?" (e.g., blurred vision, chest pain, difficulty breathing, headache, weakness)     Headache, leg and feet swelling  Protocols used: Blood Pressure - High-A-AH

## 2024-04-27 NOTE — Discharge Instructions (Signed)
 Continue taking the losartan .  We have added on amlodipine  which you should start taking tomorrow morning.  It is taken once daily.  Keep a log of your blood pressures and follow-up with your primary care provider.  In the meantime, return to the ER for any new, worsening, or persistent severe elevated blood pressure, especially blood pressure over 200 on the top number or 120 on the bottom number, chest pain, difficulty breathing, swelling, severe headache, or any other new or worsening symptoms that concern you.

## 2024-05-06 NOTE — Patient Instructions (Signed)
 Be Involved in Caring For Your Health:  Taking Medications When medications are taken as directed, they can greatly improve your health. But if they are not taken as prescribed, they may not work. In some cases, not taking them correctly can be harmful. To help ensure your treatment remains effective and safe, understand your medications and how to take them. Bring your medications to each visit for review by your provider.  Your lab results, notes, and after visit summary will be available on My Chart. We strongly encourage you to use this feature. If lab results are abnormal the clinic will contact you with the appropriate steps. If the clinic does not contact you assume the results are satisfactory. You can always view your results on My Chart. If you have questions regarding your health or results, please contact the clinic during office hours. You can also ask questions on My Chart.  We at [facility] are grateful that you chose [place] to provide your care. We strive to provide evidence-based and compassionate care and are always looking for feedback. If you get a survey from the clinic please complete this so we can hear your opinions.  DASH Eating Plan DASH stands for Dietary Approaches to Stop Hypertension. The DASH eating plan is a healthy eating plan that has been shown to: Lower high blood pressure (hypertension). Reduce your risk for type 2 diabetes, heart disease, and stroke. Help with weight loss. What are tips for following this plan? Reading food labels Check food labels for the amount of salt (sodium) per serving. Choose foods with less than 5 percent of the Daily Value (DV) of sodium. In general, foods with less than 300 milligrams (mg) of sodium per serving fit into this eating plan. To find whole grains, look for the word "whole" as the first word in the ingredient list. Shopping Buy products labeled as "low-sodium" or "no salt added." Buy fresh foods. Avoid canned  foods and pre-made or frozen meals. Cooking Try not to add salt when you cook. Use salt-free seasonings or herbs instead of table salt or sea salt. Check with your health care provider or pharmacist before using salt substitutes. Do not fry foods. Cook foods in healthy ways, such as baking, boiling, grilling, roasting, or broiling. Cook using oils that are good for your heart. These include olive, canola, avocado, soybean, and sunflower oil. Meal planning  Eat a balanced diet. This should include: 4 or more servings of fruits and 4 or more servings of vegetables each day. Try to fill half of your plate with fruits and vegetables. 6-8 servings of whole grains each day. 6 or less servings of lean meat, poultry, or fish each day. 1 oz is 1 serving. A 3 oz (85 g) serving of meat is about the same size as the palm of your hand. One egg is 1 oz (28 g). 2-3 servings of low-fat dairy each day. One serving is 1 cup (237 mL). 1 serving of nuts, seeds, or beans 5 times each week. 2-3 servings of heart-healthy fats. Healthy fats called omega-3 fatty acids are found in foods such as walnuts, flaxseeds, fortified milks, and eggs. These fats are also found in cold-water fish, such as sardines, salmon, and mackerel. Limit how much you eat of: Canned or prepackaged foods. Food that is high in trans fat, such as fried foods. Food that is high in saturated fat, such as fatty meat. Desserts and other sweets, sugary drinks, and other foods with added sugar. Full-fat  dairy products. Do not salt foods before eating. Do not eat more than 4 egg yolks a week. Try to eat at least 2 vegetarian meals a week. Eat more home-cooked food and less restaurant, buffet, and fast food. Lifestyle When eating at a restaurant, ask if your food can be made with less salt or no salt. If you drink alcohol: Limit how much you have to: 0-1 drink a day if you are female. 0-2 drinks a day if you are female. Know how much alcohol is in  your drink. In the [place] one drink is one 12 oz bottle of beer (355 mL), one 5 oz glass of wine (148 mL), or one 1 oz glass of hard liquor (44 mL). General information Avoid eating more than 2,300 mg of salt a day. If you have hypertension, you may need to reduce your sodium intake to 1,500 mg a day. Work with your provider to stay at a healthy body weight or lose weight. Ask what the best weight range is for you. On most days of the week, get at least 30 minutes of exercise that causes your heart to beat faster. This may include walking, swimming, or biking. Work with your provider or dietitian to adjust your eating plan to meet your specific calorie needs. What foods should I eat? Fruits All fresh, dried, or frozen fruit. Canned fruits that are in their natural juice and do not have sugar added to them. Vegetables Fresh or frozen vegetables that are raw, steamed, roasted, or grilled. Low-sodium or reduced-sodium tomato and vegetable juice. Low-sodium or reduced-sodium tomato sauce and tomato paste. Low-sodium or reduced-sodium canned vegetables. Grains Whole-grain or whole-wheat bread. Whole-grain or whole-wheat pasta. Brown rice. Orpah Cobb. Bulgur. Whole-grain and low-sodium cereals. Pita bread. Low-fat, low-sodium crackers. Whole-wheat flour tortillas. Meats and other proteins Skinless chicken or [place]. Ground chicken or [place]. Pork with fat trimmed off. Fish and seafood. Egg whites. Dried beans, peas, or lentils. Unsalted nuts, nut butters, and seeds. Unsalted canned beans. Lean cuts of beef with fat trimmed off. Low-sodium, lean precooked or cured meat, such as sausages or meat loaves. Dairy Low-fat (1%) or fat-free (skim) milk. Reduced-fat, low-fat, or fat-free cheeses. Nonfat, low-sodium ricotta or cottage cheese. Low-fat or nonfat yogurt. Low-fat, low-sodium cheese. Fats and oils Soft margarine without trans fats. Vegetable oil. Reduced-fat, low-fat, or light mayonnaise and salad  dressings (reduced-sodium). Canola, safflower, olive, avocado, soybean, and sunflower oils. Avocado. Seasonings and condiments Herbs. Spices. Seasoning mixes without salt. Other foods Unsalted popcorn and pretzels. Fat-free sweets. The items listed above may not be all the foods and drinks you can have. Talk to a dietitian to learn more. What foods should I avoid? Fruits Canned fruit in a light or heavy syrup. Fried fruit. Fruit in cream or butter sauce. Vegetables Creamed or fried vegetables. Vegetables in a cheese sauce. Regular canned vegetables that are not marked as low-sodium or reduced-sodium. Regular canned tomato sauce and paste that are not marked as low-sodium or reduced-sodium. Regular tomato and vegetable juices that are not marked as low-sodium or reduced-sodium. Rosita Fire. Olives. Grains Baked goods made with fat, such as croissants, muffins, or some breads. Dry pasta or rice meal packs. Meats and other proteins Fatty cuts of meat. Ribs. Fried meat. Tomasa Blase. Bologna, salami, and other precooked or cured meats, such as sausages or meat loaves, that are not lean and low in sodium. Fat from the back of a pig (fatback). Bratwurst. Salted nuts and seeds. Canned beans with added salt. Canned  or smoked fish. Whole eggs or egg yolks. Chicken or [place] with skin. Dairy Whole or 2% milk, cream, and half-and-half. Whole or full-fat cream cheese. Whole-fat or sweetened yogurt. Full-fat cheese. Nondairy creamers. Whipped toppings. Processed cheese and cheese spreads. Fats and oils Butter. Stick margarine. Lard. Shortening. Ghee. Bacon fat. Tropical oils, such as coconut, palm kernel, or palm oil. Seasonings and condiments Onion salt, garlic salt, seasoned salt, table salt, and sea salt. Worcestershire sauce. Tartar sauce. Barbecue sauce. Teriyaki sauce. Soy sauce, including reduced-sodium soy sauce. Steak sauce. Canned and packaged gravies. Fish sauce. Oyster sauce. Cocktail sauce. Store-bought  horseradish. Ketchup. Mustard. Meat flavorings and tenderizers. Bouillon cubes. Hot sauces. Pre-made or packaged marinades. Pre-made or packaged taco seasonings. Relishes. Regular salad dressings. Other foods Salted popcorn and pretzels. The items listed above may not be all the foods and drinks you should avoid. Talk to a dietitian to learn more. Where to find more information National Heart, Lung, and Blood Institute (NHLBI): BuffaloDryCleaner.gl American Heart Association (AHA): heart.org Academy of Nutrition and Dietetics: eatright.org National Kidney Foundation (NKF): kidney.org This information is not intended to replace advice given to you by your health care provider. Make sure you discuss any questions you have with your health care provider. Document Revised: 12/24/2022 Document Reviewed: 12/24/2022 Elsevier Patient Education  2024 [employer].

## 2024-05-09 ENCOUNTER — Ambulatory Visit (INDEPENDENT_AMBULATORY_CARE_PROVIDER_SITE_OTHER): Admitting: Nurse Practitioner

## 2024-05-09 ENCOUNTER — Encounter: Payer: Self-pay | Admitting: Nurse Practitioner

## 2024-05-09 VITALS — BP 138/70 | HR 69 | Temp 98.7°F | Ht 61.2 in | Wt 161.2 lb

## 2024-05-09 DIAGNOSIS — I10 Essential (primary) hypertension: Secondary | ICD-10-CM

## 2024-05-09 NOTE — Progress Notes (Signed)
 BP 138/70 (BP Location: Left Arm, Patient Position: Sitting, Cuff Size: Normal)   Pulse 69   Temp 98.7 F (37.1 C) (Oral)   Ht 5' 1.2" (1.554 m)   Wt 161 lb 3.2 oz (73.1 kg)   LMP  (LMP Unknown)   SpO2 97%   BMI 30.26 kg/m    Subjective:    Patient ID: Lisa Bass, female    DOB: [birth year], 73 y.o.   MRN: [id number]  HPI: Lisa Bass is a 73 y.o. female  Chief Complaint  Patient presents with   Hypertension   Dental = Dr. Suzon Ester in [facility] -- phone # [phone]  HYPERTENSION without Chronic Kidney Disease Seen in ER on 04/27/24 for elevations in BP at home.  Was started on Amlodipine  5 MG at the time. Continues on Losartan  100 MG daily. Recently had a lot of dental work done, saw elevation in BP at dental office. Overall BP has been reducing with Amlodipine  with SBP from 120 to 140 range, had some elevations yesterday due to being active.  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  Denies any increase in salt in diet and is drinking lots of water. Non smoker and does not drink alcohol.   Hypertension status: stable  Satisfied with current treatment? yes Duration of hypertension: chronic BP monitoring frequency:  daily - 3 times daily BP range: 121/74 to 174/88 as above these are trending down BP medication side effects:  no Medication compliance: good compliance Aspirin : yes Recurrent headaches: no Visual changes: no Palpitations: no Dyspnea: no Chest pain: no Lower extremity edema: no Dizzy/lightheaded: no   Relevant past medical, surgical, family and social history reviewed and updated as indicated. Interim medical history since our last visit reviewed. Allergies and medications reviewed and updated.  Review of Systems  Constitutional:  Negative for activity change, appetite change, diaphoresis, fatigue and fever.  Respiratory:  Negative for cough, chest tightness and shortness of breath.   Cardiovascular:   Negative for chest pain, palpitations and leg swelling.  Gastrointestinal: Negative.   Neurological: Negative.   Psychiatric/Behavioral: Negative.      Per HPI unless specifically indicated above     Objective:     BP 138/70 (BP Location: Left Arm, Patient Position: Sitting, Cuff Size: Normal)   Pulse 69   Temp 98.7 F (37.1 C) (Oral)   Ht 5' 1.2" (1.554 m)   Wt 161 lb 3.2 oz (73.1 kg)   LMP  (LMP Unknown)   SpO2 97%   BMI 30.26 kg/m   Wt Readings from Last 3 Encounters:  05/09/24 161 lb 3.2 oz (73.1 kg)  04/27/24 175 lb (79.4 kg)  02/22/24 165 lb (74.8 kg)    Physical Exam Vitals and nursing note reviewed.  Constitutional:      General: She is awake. She is not in acute distress.    Appearance: She is well-developed and well-groomed. She is obese. She is not ill-appearing or toxic-appearing.  HENT:     Head: Normocephalic.     Right Ear: Hearing and external ear normal.     Left Ear: Hearing and external ear normal.  Eyes:     General: Lids are normal.        Right eye: No discharge.        Left eye: No discharge.     Conjunctiva/sclera: Conjunctivae normal.     Pupils: Pupils are equal, round, and reactive to light.  Neck:  Thyroid : No thyromegaly.     Vascular: No carotid bruit.  Cardiovascular:     Rate and Rhythm: Normal rate and regular rhythm.     Heart sounds: Normal heart sounds. No murmur heard.    No gallop.  Pulmonary:     Effort: Pulmonary effort is normal. No accessory muscle usage or respiratory distress.     Breath sounds: Normal breath sounds.  Abdominal:     General: Bowel sounds are normal. There is no distension.     Palpations: Abdomen is soft.     Tenderness: There is no abdominal tenderness.  Musculoskeletal:     Cervical back: Normal range of motion and neck supple.     Right lower leg: No edema.     Left lower leg: No edema.  Lymphadenopathy:     Cervical: No cervical adenopathy.  Skin:    General: Skin is warm and dry.   Neurological:     Mental Status: She is alert and oriented to person, place, and time.     Deep Tendon Reflexes: Reflexes are normal and symmetric.     Reflex Scores:      Brachioradialis reflexes are 2+ on the right side and 2+ on the left side.      Patellar reflexes are 2+ on the right side and 2+ on the left side. Psychiatric:        Attention and Perception: Attention normal.        Mood and Affect: Mood normal.        Speech: Speech normal.        Behavior: Behavior normal. Behavior is cooperative.        Thought Content: Thought content normal.    Results for orders placed or performed during the hospital encounter of 04/27/24  Basic metabolic panel   Collection Time: 04/27/24 10:56 AM  Result Value Ref Range   Sodium 142 135 - 145 mmol/L   Potassium 3.5 3.5 - 5.1 mmol/L   Chloride 106 98 - 111 mmol/L   CO2 27 22 - 32 mmol/L   Glucose, Bld 112 (H) 70 - 99 mg/dL   BUN 8 8 - 23 mg/dL   Creatinine, Ser 9.14 0.44 - 1.00 mg/dL   Calcium  9.3 8.9 - 10.3 mg/dL   GFR, Estimated >78 >29 mL/min   Anion gap 9 5 - 15  CBC   Collection Time: 04/27/24 10:56 AM  Result Value Ref Range   WBC 6.2 4.0 - 10.5 K/uL   RBC 4.23 3.87 - 5.11 MIL/uL   Hemoglobin 12.2 12.0 - 15.0 g/dL   HCT 56.2 13.0 - 86.5 %   MCV 85.8 80.0 - 100.0 fL   MCH 28.8 26.0 - 34.0 pg   MCHC 33.6 30.0 - 36.0 g/dL   RDW 78.4 69.6 - 29.5 %   Platelets 175 150 - 400 K/uL   nRBC 0.0 0.0 - 0.2 %  Troponin I (High Sensitivity)   Collection Time: 04/27/24 10:56 AM  Result Value Ref Range   Troponin I (High Sensitivity) 5 <18 ng/L      Assessment & Plan:   Problem List Items Addressed This Visit       Cardiovascular and Mediastinum   Hypertension - Primary   Chronic, ongoing.  BP improving on check in office and at home BP trending down nicely.  Will continue Losartan  100 MG daily and Amlodipine  5 MG daily.  Reiterated importance of taking these daily to prevent BP elevations which could lead to acute  stroke.   Recommend continue to monitor BP daily at home and notify provider if consistent above 130/80.  Labs: obtain next visit.  Recommend focus on DASH diet and modest weight loss.  Continue collaboration with cardiology, appreciate their input.  Urine ALB 28 February 2024.   - If ongoing consistent BP above 130/80 at next visit OR if any trend back up at home will increase Amlodipine  to 10 MG.  At this time will hold off on change to ensure no orthostatic levels present.        Follow up plan: Return in about 4 weeks (around 06/06/2024) for HTN.

## 2024-05-09 NOTE — Assessment & Plan Note (Signed)
 Chronic, ongoing.  BP improving on check in office and at home BP trending down nicely.  Will continue Losartan  100 MG daily and Amlodipine  5 MG daily.  Reiterated importance of taking these daily to prevent BP elevations which could lead to acute stroke.  Recommend continue to monitor BP daily at home and notify provider if consistent above 130/80.  Labs: obtain next visit.  Recommend focus on DASH diet and modest weight loss.  Continue collaboration with cardiology, appreciate their input.  Urine ALB 28 February 2024.   - If ongoing consistent BP above 130/80 at next visit OR if any trend back up at home will increase Amlodipine  to 10 MG.  At this time will hold off on change to ensure no orthostatic levels present.

## 2024-05-21 ENCOUNTER — Encounter: Payer: Self-pay | Admitting: Emergency Medicine

## 2024-05-21 ENCOUNTER — Ambulatory Visit
Admission: EM | Admit: 2024-05-21 | Discharge: 2024-05-21 | Disposition: A | Discharge status: Home / Self Care | Attending: Emergency Medicine | Admitting: Emergency Medicine

## 2024-05-21 DIAGNOSIS — L03116 Cellulitis of left lower limb: Secondary | ICD-10-CM | POA: Diagnosis not present

## 2024-05-21 DIAGNOSIS — W57XXXA Bitten or stung by nonvenomous insect and other nonvenomous arthropods, initial encounter: Secondary | ICD-10-CM | POA: Diagnosis not present

## 2024-05-21 DIAGNOSIS — S70362A Insect bite (nonvenomous), left thigh, initial encounter: Secondary | ICD-10-CM

## 2024-05-21 MED ORDER — DOXYCYCLINE HYCLATE 100 MG PO CAPS: 100.0000 mg | ORAL_CAPSULE | Freq: Two times a day (BID) | ORAL | 0 refills | Status: DC

## 2024-05-21 NOTE — ED Provider Notes (Signed)
 [facility]    CSN: [id number] Arrival date & time: 05/21/24  0957      History   Chief Complaint Chief Complaint  Patient presents with   Insect Bite    tick    HPI CHANIQUA BRISBY is a 73 y.o. female.   HPI  19 old female with past medical history significant for OCD, hypertension, hyperlipidemia, CHF, cataracts, and asthma presents for evaluation of a tick bite on her left hip.  She noticed a tick on her 2 days ago and she removed it in her entirety.  She did bring it with her.  She denies any fever, headache, or joint pain.  Also no rashes.  She does report that the site is raised, red, and warm to touch.  Past Medical History:  Diagnosis Date   Allergy    Asthma    Cataract    CHF (congestive heart failure) (HCC)    Depression    Hyperlipidemia    Hypertension    OCD (obsessive compulsive disorder)    Palpitations    Perimenopause     Patient Active Problem List   Diagnosis Date Noted   Vitamin D  deficiency 11/11/2021   Osteopenia of neck of left femur 11/09/2021   Obesity 11/08/2020   Elevated TSH 02/23/2019   Lipoma of neck 09/21/2017   B12 deficiency 09/21/2017   Elevated hemoglobin A1c 09/21/2017   Mild cognitive impairment 09/15/2017   Myxoma of heart 09/23/2015   Major depression, chronic 08/09/2015   Hypertension 08/08/2015   Hyperlipidemia 08/08/2015    Past Surgical History:  Procedure Laterality Date   CATARACT EXTRACTION, BILATERAL  02/2021   CHOLECYSTECTOMY     COLONOSCOPY WITH PROPOFOL  N/A 09/07/2017   Procedure: COLONOSCOPY WITH PROPOFOL ;  Surgeon: Marnee Sink, MD;  Location: [facility] ENDOSCOPY;  Service: Endoscopy;  Laterality: N/A;   open heart surgery     ROOT CANAL     x2    OB History   No obstetric history on file.      Home Medications    Prior to Admission medications   Medication Sig Start Date End Date Taking? Authorizing Provider  amLODipine  (NORVASC ) 5 MG tablet Take 1 tablet (5 mg total) by mouth daily.  04/27/24 06/26/24 Yes Lind Repine, MD  aspirin  81 MG tablet Take 81 mg by mouth daily.   Yes [provider]  Cholecalciferol  1.25 MG (50000 UT) TABS Take 1 tablet by mouth once a week. 02/23/24  Yes Cannady, Jolene T, NP  cyanocobalamin  (VITAMIN B12) 1000 MCG tablet Take 1 tablet (1,000 mcg total) by mouth daily. 02/23/24  Yes Cannady, Jolene T, NP  doxycycline  (VIBRAMYCIN ) 100 MG capsule Take 1 capsule (100 mg total) by mouth 2 (two) times daily. 05/21/24  Yes Kent Pear, NP  ezetimibe  (ZETIA ) 10 MG tablet Take 1 tablet (10 mg total) by mouth daily. 02/29/24  Yes Cannady, Jolene T, NP  losartan  (COZAAR ) 100 MG tablet Take 1 tablet (100 mg total) by mouth daily. 07/20/23  Yes Cannady, Jolene T, NP  Multiple Vitamin (MULTIVITAMIN) tablet Take 1 tablet by mouth daily.   Yes [provider]  PARoxetine  (PAXIL ) 40 MG tablet Take 0.5 tablets (20 mg total) by mouth daily. Take 1/2 dose 07/20/23  Yes Cannady, Jolene T, NP  rosuvastatin  (CRESTOR ) 10 MG tablet Take 1 tablet (10 MG) by mouth on Monday, Wednesday, and Friday. 07/20/23  Yes Cannady, Jolene T, NP  nystatin  cream (MYCOSTATIN ) Apply 1 Application topically 2 (two) times daily.  10/08/23   Mecum, Erin E, PA-C  triamcinolone  cream (KENALOG ) 0.1 % Apply 1 Application topically 2 (two) times daily. 10/08/23   Mecum, Pearla Bottom, PA-C    Family History Family History  Problem Relation Age of Onset   Hypertension Mother    Dementia Father    Hypertension Father    Diabetes Father    Depression Father    GI problems Daughter    Heart disease Maternal Grandfather    Depression Sister     Social History Social History   Tobacco Use   Smoking status: Never   Smokeless tobacco: Never  Vaping Use   Vaping status: Never Used  Substance Use Topics   Alcohol use: No    Alcohol/week: 0.0 standard drinks of alcohol   Drug use: No     Allergies   Penicillins, Biaxin [clarithromycin], and Lisinopril   Review of Systems Review  of Systems  Constitutional:  Negative for fever.  Musculoskeletal:  Negative for arthralgias and joint swelling.  Skin:  Positive for color change and wound. Negative for rash.  Neurological:  Negative for headaches.     Physical Exam Triage Vital Signs ED Triage Vitals  Encounter Vitals Group     BP      Systolic BP Percentile      Diastolic BP Percentile      Pulse      Resp      Temp      Temp src      SpO2      Weight      Height      Head Circumference      Peak Flow      Pain Score      Pain Loc      Pain Education      Exclude from Growth Chart    No data found.  Updated Vital Signs BP (!) 153/79 (BP Location: Left Arm)   Pulse 69   Temp 97.9 F (36.6 C) (Oral)   Resp 16   Ht 5\' 1"  (1.549 m)   Wt 161 lb 2.5 oz (73.1 kg)   LMP  (LMP Unknown)   SpO2 98%   BMI 30.45 kg/m   Visual Acuity Right Eye Distance:   Left Eye Distance:   Bilateral Distance:    Right Eye Near:   Left Eye Near:    Bilateral Near:     Physical Exam Vitals and nursing note reviewed.  Constitutional:      Appearance: Normal appearance.  Skin:    General: Skin is warm and dry.     Capillary Refill: Capillary refill takes less than 2 seconds.     Findings: Erythema present.  Neurological:     General: No focal deficit present.     Mental Status: She is alert and oriented to person, place, and time.      UC Treatments / Results  Labs (all labs ordered are listed, but only abnormal results are displayed) Labs Reviewed - No data to display  EKG   Radiology No results found.  Procedures Procedures (including critical care time)  Medications Ordered in UC Medications - No data to display  Initial Impression / Assessment and Plan / UC Course  I have reviewed the triage vital signs and the nursing notes.  Pertinent labs & imaging results that were available during my care of the patient were reviewed by me and considered in my medical decision making (see chart for  details).  Patient is a pleasant, nontoxic-appearing 25 old female presenting for evaluation of a tick bite to her left thigh that occurred 2 days ago.  As you can see in image above, there is a central area of erythema at the site of envenomation followed by some minor erythema and edema.  The area is warm to touch.  No erythema migrans rash noted.  The image above is of the tick that she removed.  As you can see, it is a [place] tick which decreases the potential for Lyme though it does present the potential for other tickborne illnesses as well as for the development of alpha gal.  I did review review the symptoms of alpha gal with the patient.  Due to the fact that the area is warm, raised, and erythematous I will start her on prophylactic doxycycline  100 mg twice daily for 10 days.   Final Clinical Impressions(s) / UC Diagnoses   Final diagnoses:  Cellulitis of left lower extremity  Tick bite of left thigh, initial encounter     Discharge Instructions      Take the doxycycline  100 mg twice daily with food for 10 days to prevent tickborne illness following your recent tick bite.  Continue to monitor the bite site on your left thigh for any increased redness, swelling, red streaks, pus drainage, or if you start running fevers.  If the conditions above arise you need to return for reevaluation.  Because you were bitten by a [place] tick there is the possibility that you could develop alpha gal syndrome at some point in the future.  This is an allergy to red meat and mammalian products.  If you develop any rashes, hives, swelling of your lips or tongue, or shortness of breath in the coming weeks after you eat red meat, cheese, yogurt, or other dairy products you need to go to the ER for evaluation.  If you have no respiratory issues associated with ingestion of mammalian products you can follow-up with your PCP for testing for alpha gal syndrome.   ED Prescriptions     Medication  Sig Dispense Auth. Provider   doxycycline  (VIBRAMYCIN ) 100 MG capsule Take 1 capsule (100 mg total) by mouth 2 (two) times daily. 20 capsule Kent Pear, NP      PDMP not reviewed this encounter.   Kent Pear, NP 05/21/24 1037

## 2024-05-21 NOTE — ED Triage Notes (Signed)
 Pt has a tick bite on her left hip. She noticed it 2 days ago. She states there is some redness and swelling in the area. Denies rash.

## 2024-05-21 NOTE — Discharge Instructions (Addendum)
 Take the doxycycline  100 mg twice daily with food for 10 days to prevent tickborne illness following your recent tick bite.  Continue to monitor the bite site on your left thigh for any increased redness, swelling, red streaks, pus drainage, or if you start running fevers.  If the conditions above arise you need to return for reevaluation.  Because you were bitten by a [place] tick there is the possibility that you could develop alpha gal syndrome at some point in the future.  This is an allergy to red meat and mammalian products.  If you develop any rashes, hives, swelling of your lips or tongue, or shortness of breath in the coming weeks after you eat red meat, cheese, yogurt, or other dairy products you need to go to the ER for evaluation.  If you have no respiratory issues associated with ingestion of mammalian products you can follow-up with your PCP for testing for alpha gal syndrome.

## 2024-05-24 ENCOUNTER — Telehealth: Payer: Self-pay | Admitting: Nurse Practitioner

## 2024-05-24 NOTE — Telephone Encounter (Signed)
 Copied from CRM [id number]. Topic: General - Other >> May 24, 2024 11:04 AM Felizardo Hotter wrote: Reason for CRM: Pt called stated was bitten on left leg by tick and wants to know about testing. Pt would like a call back at [phone].

## 2024-05-24 NOTE — Telephone Encounter (Signed)
 Please call and schedule visit for the patient. Testing cannot be ordered without a provider seeing the patient.

## 2024-05-25 NOTE — Telephone Encounter (Signed)
 Called patient, left message for patient to call office and schedule appt.

## 2024-06-03 NOTE — Patient Instructions (Signed)

## 2024-06-06 ENCOUNTER — Encounter: Payer: Self-pay | Admitting: Nurse Practitioner

## 2024-06-06 ENCOUNTER — Ambulatory Visit: Admitting: Nurse Practitioner

## 2024-06-06 VITALS — BP 140/72 | HR 76 | Temp 97.7°F | Ht 61.0 in | Wt 162.0 lb

## 2024-06-06 DIAGNOSIS — I10 Essential (primary) hypertension: Secondary | ICD-10-CM

## 2024-06-06 DIAGNOSIS — S70362A Insect bite (nonvenomous), left thigh, initial encounter: Secondary | ICD-10-CM

## 2024-06-06 DIAGNOSIS — W57XXXA Bitten or stung by nonvenomous insect and other nonvenomous arthropods, initial encounter: Secondary | ICD-10-CM | POA: Diagnosis not present

## 2024-06-06 MED ORDER — AMLODIPINE BESYLATE 10 MG PO TABS: 10.0000 mg | ORAL_TABLET | Freq: Every day | ORAL | 1 refills | Status: DC

## 2024-06-06 NOTE — Progress Notes (Signed)
 BP (!) 140/72 (BP Location: Left Arm, Patient Position: Sitting, Cuff Size: Normal)   Pulse 76   Temp 97.7 F (36.5 C) (Oral)   Ht 5' 1 (1.549 m)   Wt 162 lb (73.5 kg)   LMP  (LMP Unknown)   SpO2 99%   BMI 30.61 kg/m    Subjective:    Patient ID: Lisa Bass, female    DOB: 1951/12/11, 73 y.o.   MRN: [id number]  HPI: Lisa Bass is a 73 y.o. female  Chief Complaint  Patient presents with   Medical Management of Chronic Issues   Insect Bite    Was treated at urgent care, concerned about alpha-gal   HYPERTENSION without Chronic Kidney Disease Presents for follow-up on medication changes with Amlodipine .  Continues on Losartan  100 MG daily and Amlodipine  5 MG daily.  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 Has not been walking as much due to taking Doxycyline.  She is concerned as had recent tick bite and was seen in urgent care on 05/21/24, [facility].  They treated with Doxycycline .  She is concerned about Alpha Gal and wonders if she needs testing due to UC telling her about this.  Hypertension status: stable  Satisfied with current treatment? yes Duration of hypertension: chronic BP monitoring frequency:  daily - 3 times daily BP range: 119/74 to 154/86, averaging above 130/80 BP medication side effects:  no Medication compliance: good compliance Aspirin : yes Recurrent headaches: no Visual changes: no Palpitations: no Dyspnea: no Chest pain: no Lower extremity edema: no Dizzy/lightheaded: no   Relevant past medical, surgical, family and social history reviewed and updated as indicated. Interim medical history since our last visit reviewed. Allergies and medications reviewed and updated.  Review of Systems  Constitutional:  Negative for activity change, appetite change, diaphoresis, fatigue and fever.  Respiratory:  Negative for cough, chest tightness and shortness of breath.   Cardiovascular:  Negative for chest pain, palpitations and leg  swelling.  Gastrointestinal: Negative.   Neurological: Negative.   Psychiatric/Behavioral: Negative.     Per HPI unless specifically indicated above     Objective:     BP (!) 140/72 (BP Location: Left Arm, Patient Position: Sitting, Cuff Size: Normal)   Pulse 76   Temp 97.7 F (36.5 C) (Oral)   Ht 5' 1 (1.549 m)   Wt 162 lb (73.5 kg)   LMP  (LMP Unknown)   SpO2 99%   BMI 30.61 kg/m   Wt Readings from Last 3 Encounters:  06/06/24 162 lb (73.5 kg)  05/21/24 161 lb 2.5 oz (73.1 kg)  05/09/24 161 lb 3.2 oz (73.1 kg)    Physical Exam Vitals and nursing note reviewed.  Constitutional:      General: She is awake. She is not in acute distress.    Appearance: She is well-developed and well-groomed. She is obese. She is not ill-appearing or toxic-appearing.  HENT:     Head: Normocephalic.     Right Ear: Hearing and external ear normal.     Left Ear: Hearing and external ear normal.   Eyes:     General: Lids are normal.        Right eye: No discharge.        Left eye: No discharge.     Conjunctiva/sclera: Conjunctivae normal.     Pupils: Pupils are equal, round, and reactive to light.   Neck:     Thyroid : No thyromegaly.     Vascular:  No carotid bruit.   Cardiovascular:     Rate and Rhythm: Normal rate and regular rhythm.     Heart sounds: Normal heart sounds. No murmur heard.    No gallop.  Pulmonary:     Effort: Pulmonary effort is normal. No accessory muscle usage or respiratory distress.     Breath sounds: Normal breath sounds.  Abdominal:     General: Bowel sounds are normal. There is no distension.     Palpations: Abdomen is soft.     Tenderness: There is no abdominal tenderness.   Musculoskeletal:     Cervical back: Normal range of motion and neck supple.     Right lower leg: No edema.     Left lower leg: No edema.  Lymphadenopathy:     Cervical: No cervical adenopathy.   Skin:    General: Skin is warm and dry.   Neurological:     Mental Status: She  is alert and oriented to person, place, and time.     Deep Tendon Reflexes: Reflexes are normal and symmetric.     Reflex Scores:      Brachioradialis reflexes are 2+ on the right side and 2+ on the left side.      Patellar reflexes are 2+ on the right side and 2+ on the left side.  Psychiatric:        Attention and Perception: Attention normal.        Mood and Affect: Mood normal.        Speech: Speech normal.        Behavior: Behavior normal. Behavior is cooperative.        Thought Content: Thought content normal.    Results for orders placed or performed during the hospital encounter of 04/27/24  Basic metabolic panel   Collection Time: 04/27/24 10:56 AM  Result Value Ref Range   Sodium 142 135 - 145 mmol/L   Potassium 3.5 3.5 - 5.1 mmol/L   Chloride 106 98 - 111 mmol/L   CO2 27 22 - 32 mmol/L   Glucose, Bld 112 (H) 70 - 99 mg/dL   BUN 8 8 - 23 mg/dL   Creatinine, Ser 4.78 0.44 - 1.00 mg/dL   Calcium  9.3 8.9 - 10.3 mg/dL   GFR, Estimated >29 >56 mL/min   Anion gap 9 5 - 15  CBC   Collection Time: 04/27/24 10:56 AM  Result Value Ref Range   WBC 6.2 4.0 - 10.5 K/uL   RBC 4.23 3.87 - 5.11 MIL/uL   Hemoglobin 12.2 12.0 - 15.0 g/dL   HCT 21.3 08.6 - 57.8 %   MCV 85.8 80.0 - 100.0 fL   MCH 28.8 26.0 - 34.0 pg   MCHC 33.6 30.0 - 36.0 g/dL   RDW 46.9 62.9 - 52.8 %   Platelets 175 150 - 400 K/uL   nRBC 0.0 0.0 - 0.2 %  Troponin I (High Sensitivity)   Collection Time: 04/27/24 10:56 AM  Result Value Ref Range   Troponin I (High Sensitivity) 5 <18 ng/L      Assessment & Plan:   Problem List Items Addressed This Visit       Cardiovascular and Mediastinum   Hypertension - Primary   Chronic, ongoing.  BP improving but remains above goal in office and at home.  Will continue Losartan  100 MG daily, but increase Amlodipine  to 10 MG daily.  Reiterated importance of taking these daily to prevent BP elevations which could lead to acute stroke.  Recommend  she continue to monitor  BP daily at home and notify provider if consistent above 130/80.  Labs: obtain next visit.  Recommend focus on DASH diet and modest weight loss.  Continue collaboration with cardiology, appreciate their input.  Urine ALB 28 February 2024.       Relevant Medications   amLODipine  (NORVASC ) 10 MG tablet     Musculoskeletal and Integument   Tick bite   Area of bite is improving.  Completed Doxycyline.  Will do Alpha Gal testing next visit per patient request.         Follow up plan: Return in about 8 weeks (around 08/01/2024) for HTN -- to check on increase of Amlodipine  to 10 MG.

## 2024-06-06 NOTE — Assessment & Plan Note (Signed)
 Area of bite is improving.  Completed Doxycyline.  Will do Alpha Gal testing next visit per patient request.

## 2024-06-06 NOTE — Assessment & Plan Note (Signed)
 Chronic, ongoing.  BP improving but remains above goal in office and at home.  Will continue Losartan  100 MG daily, but increase Amlodipine  to 10 MG daily.  Reiterated importance of taking these daily to prevent BP elevations which could lead to acute stroke.  Recommend she continue to monitor BP daily at home and notify provider if consistent above 130/80.  Labs: obtain next visit.  Recommend focus on DASH diet and modest weight loss.  Continue collaboration with cardiology, appreciate their input.  Urine ALB 28 February 2024.

## 2024-06-07 ENCOUNTER — Other Ambulatory Visit: Payer: Self-pay | Admitting: Nurse Practitioner

## 2024-06-09 NOTE — Telephone Encounter (Signed)
 Requested Prescriptions  Pending Prescriptions Disp Refills   ezetimibe  (ZETIA ) 10 MG tablet [Pharmacy Med Name: EZETIMIBE  10 MG TAB] 90 tablet 1    Sig: TAKE 1 TABLET BY MOUTH ONCE DAILY     Cardiovascular:  Antilipid - Sterol Transport Inhibitors Failed - 06/09/2024 12:31 PM      Failed - Lipid Panel in normal range within the last 12 months    Cholesterol, Total  Date Value Ref Range Status  02/22/2024 194 100 - 199 mg/dL Final   Cholesterol Piccolo, Waived  Date Value Ref Range Status  06/19/2020 148 <200 mg/dL Final    Comment:                            Desirable                <200                         Borderline High      200- 239                         High                     >239    LDL Chol Calc (NIH)  Date Value Ref Range Status  02/22/2024 122 (H) 0 - 99 mg/dL Final   HDL  Date Value Ref Range Status  02/22/2024 54 >39 mg/dL Final   Triglycerides  Date Value Ref Range Status  02/22/2024 101 0 - 149 mg/dL Final   Triglycerides Piccolo,Waived  Date Value Ref Range Status  06/19/2020 107 <150 mg/dL Final    Comment:                            Normal                   <150                         Borderline High     150 - 199                         High                200 - 499                         Very High                >499          Passed - AST in normal range and within 360 days    AST  Date Value Ref Range Status  02/22/2024 21 0 - 40 IU/L Final   SGOT(AST)  Date Value Ref Range Status  04/01/2014 43 (H) 15 - 37 Unit/L Final         Passed - ALT in normal range and within 360 days    ALT  Date Value Ref Range Status  02/22/2024 12 0 - 32 IU/L Final   SGPT (ALT)  Date Value Ref Range Status  04/01/2014 37 12 - 78 U/L Final         Passed - Patient is not pregnant      Passed -  Valid encounter within last 12 months    Recent Outpatient Visits           3 days ago Primary hypertension   [place] [facility]  [place], Norridge T, NP   1 month ago Primary hypertension   [place] [facility] [place], Colburn T, NP   3 months ago Major depression, chronic   [place] [facility] [place], Lavelle Posey, NP

## 2024-06-12 ENCOUNTER — Telehealth: Payer: Self-pay

## 2024-06-12 NOTE — Telephone Encounter (Signed)
 Printed and re-faxed form that was scanned under media.   Lisa Bass [facility] and confirmed that the fax was received.

## 2024-06-12 NOTE — Telephone Encounter (Signed)
 Copied from CRM [id number]. Topic: General - Other >> Jun 12, 2024  2:46 PM Tiffany S wrote: Reason for CRM: [facility] dentist calling about a form sent over  [phone] Fax [phone] open until 430  Patient appt is tomorrow  Need BP clearance

## 2024-07-03 ENCOUNTER — Telehealth: Payer: Self-pay

## 2024-07-03 NOTE — Telephone Encounter (Signed)
 Copied from CRM [id number]. Topic: Clinical - Medication Question >> Jul 03, 2024  9:55 AM Tobias CROME wrote: Reason for CRM: Patient has dental appointment on 07/06/24 to extract 3 of the patient's teeth. Patient inquiring if she needs to take antibiotics before that procedure.   Patient seeking clinical advice.

## 2024-07-03 NOTE — Telephone Encounter (Signed)
 Called and LVM asking for patient to please return my call.   OK for E2C2 to speak to patient and advise her of Jolene's message if patient calls back.

## 2024-07-04 MED ORDER — DOXYCYCLINE HYCLATE 100 MG PO TABS: 100.0000 mg | ORAL_TABLET | Freq: Two times a day (BID) | ORAL | 0 refills | Status: DC

## 2024-07-04 NOTE — Telephone Encounter (Signed)
Called and notified patient of Jolene's message. Patient verbalized understanding.

## 2024-07-04 NOTE — Telephone Encounter (Signed)
 Copied from CRM [id number]. Topic: General - Call Back - No Documentation >> Jul 04, 2024  8:44 AM Tonda B wrote: Reason for CRM: patient is calling to say that the dental procedure that she is having she has not had before and that she will have 3 teeth pulled so she thinks that she would need the antibiotic please call pt back [phone]

## 2024-07-11 ENCOUNTER — Ambulatory Visit: Admitting: Pediatrics

## 2024-07-11 ENCOUNTER — Ambulatory Visit: Admitting: Nurse Practitioner

## 2024-07-11 ENCOUNTER — Ambulatory Visit: Payer: Self-pay

## 2024-07-11 ENCOUNTER — Encounter: Payer: Self-pay | Admitting: Nurse Practitioner

## 2024-07-11 VITALS — BP 136/70 | HR 78 | Temp 98.0°F | Ht 61.0 in | Wt 161.0 lb

## 2024-07-11 DIAGNOSIS — I10 Essential (primary) hypertension: Secondary | ICD-10-CM

## 2024-07-11 DIAGNOSIS — W57XXXD Bitten or stung by nonvenomous insect and other nonvenomous arthropods, subsequent encounter: Secondary | ICD-10-CM | POA: Diagnosis not present

## 2024-07-11 MED ORDER — AMLODIPINE BESYLATE 5 MG PO TABS: 5.0000 mg | ORAL_TABLET | Freq: Every day | ORAL | 0 refills | Status: DC

## 2024-07-11 NOTE — Assessment & Plan Note (Signed)
 Chronic. Suspect lower extremity edema was from her Amlodipine  being increased to 10mg .  Decreased amlodipine  to 5mg .  Now that patient is less stressed, will have her check her blood pressure at home over the next month.  Keep a log and bring to next visit.  Patient states HCTZ caused stomach problems.  Follow up in 1 month with PCP.

## 2024-07-11 NOTE — Progress Notes (Signed)
 BP 136/70   Pulse 78   Temp 98 F (36.7 C) (Oral)   Ht 5' 1 (1.549 m)   Wt 161 lb (73 kg)   LMP  (LMP Unknown)   SpO2 98%   BMI 30.42 kg/m    Subjective:    Patient ID: Lisa Bass, female    DOB: 05-23-1951, 73 y.o.   MRN: [id number]  HPI: Lisa Bass is a 73 y.o. female  Chief Complaint  Patient presents with   Leg Swelling    Patient states she has been having bilateral leg and feet swelling for the last 2 to 3 days.    Patient states she is breaking out on her foot.  Her foot is swelling that started Sunday.  She had a tick on her June 1.  She stopped eating pork and beef due to risk of Alpha Gal.  Her foot has started itching, thought she might have a bite.  She used gold bond and thought it might have an ingredient in it that can worsen alpha gal.    HYPERTENSION without Chronic Kidney Disease Amlodipine  was increased on 06/06/24.  At the time, patient was very stressed about her upcoming dental work.  A lot less stressed now.  Hypertension status: controlled  Satisfied with current treatment? no Duration of hypertension: years BP monitoring frequency:  daily BP range: 120-130/70 BP medication side effects:  no Medication compliance: excellent compliance Previous BP meds:amlodipine  and losartan  (cozaar ) Aspirin : no Recurrent headaches: no Visual changes: no Palpitations: no Dyspnea: no Chest pain: no Lower extremity edema: yes Dizzy/lightheaded: no     Relevant past medical, surgical, family and social history reviewed and updated as indicated. Interim medical history since our last visit reviewed. Allergies and medications reviewed and updated.  Review of Systems  Eyes:  Negative for visual disturbance.  Respiratory:  Negative for cough, chest tightness and shortness of breath.   Cardiovascular:  Positive for leg swelling. Negative for chest pain and palpitations.  Neurological:  Negative for dizziness and headaches.    Per HPI unless  specifically indicated above     Objective:    BP 136/70   Pulse 78   Temp 98 F (36.7 C) (Oral)   Ht 5' 1 (1.549 m)   Wt 161 lb (73 kg)   LMP  (LMP Unknown)   SpO2 98%   BMI 30.42 kg/m   Wt Readings from Last 3 Encounters:  07/11/24 161 lb (73 kg)  06/06/24 162 lb (73.5 kg)  05/21/24 161 lb 2.5 oz (73.1 kg)    Physical Exam Vitals and nursing note reviewed.  Constitutional:      General: She is not in acute distress.    Appearance: Normal appearance. She is normal weight. She is not ill-appearing, toxic-appearing or diaphoretic.  HENT:     Head: Normocephalic.     Right Ear: External ear normal.     Left Ear: External ear normal.     Nose: Nose normal.     Mouth/Throat:     Mouth: Mucous membranes are moist.     Pharynx: Oropharynx is clear.  Eyes:     General:        Right eye: No discharge.        Left eye: No discharge.     Extraocular Movements: Extraocular movements intact.     Conjunctiva/sclera: Conjunctivae normal.     Pupils: Pupils are equal, round, and reactive to light.  Cardiovascular:     Rate  and Rhythm: Normal rate and regular rhythm.     Heart sounds: No murmur heard. Pulmonary:     Effort: Pulmonary effort is normal. No respiratory distress.     Breath sounds: Normal breath sounds. No wheezing or rales.  Musculoskeletal:     Cervical back: Normal range of motion and neck supple.     Right lower leg: 2+ Pitting Edema present.     Left lower leg: 2+ Pitting Edema present.  Skin:    General: Skin is warm and dry.     Capillary Refill: Capillary refill takes less than 2 seconds.  Neurological:     General: No focal deficit present.     Mental Status: She is alert and oriented to person, place, and time. Mental status is at baseline.  Psychiatric:        Mood and Affect: Mood normal.        Behavior: Behavior normal.        Thought Content: Thought content normal.        Judgment: Judgment normal.     Results for orders placed or  performed during the hospital encounter of 04/27/24  Basic metabolic panel   Collection Time: 04/27/24 10:56 AM  Result Value Ref Range   Sodium 142 135 - 145 mmol/L   Potassium 3.5 3.5 - 5.1 mmol/L   Chloride 106 98 - 111 mmol/L   CO2 27 22 - 32 mmol/L   Glucose, Bld 112 (H) 70 - 99 mg/dL   BUN 8 8 - 23 mg/dL   Creatinine, Ser 9.07 0.44 - 1.00 mg/dL   Calcium  9.3 8.9 - 10.3 mg/dL   GFR, Estimated >39 >39 mL/min   Anion gap 9 5 - 15  CBC   Collection Time: 04/27/24 10:56 AM  Result Value Ref Range   WBC 6.2 4.0 - 10.5 K/uL   RBC 4.23 3.87 - 5.11 MIL/uL   Hemoglobin 12.2 12.0 - 15.0 g/dL   HCT 63.6 63.9 - 53.9 %   MCV 85.8 80.0 - 100.0 fL   MCH 28.8 26.0 - 34.0 pg   MCHC 33.6 30.0 - 36.0 g/dL   RDW 86.7 88.4 - 84.4 %   Platelets 175 150 - 400 K/uL   nRBC 0.0 0.0 - 0.2 %  Troponin I (High Sensitivity)   Collection Time: 04/27/24 10:56 AM  Result Value Ref Range   Troponin I (High Sensitivity) 5 <18 ng/L      Assessment & Plan:   Problem List Items Addressed This Visit       Cardiovascular and Mediastinum   Hypertension   Chronic. Suspect lower extremity edema was from her Amlodipine  being increased to 10mg .  Decreased amlodipine  to 5mg .  Now that patient is less stressed, will have her check her blood pressure at home over the next month.  Keep a log and bring to next visit.  Patient states HCTZ caused stomach problems.  Follow up in 1 month with PCP.      Relevant Medications   amLODipine  (NORVASC ) 5 MG tablet   Other Visit Diagnoses       Tick bite, unspecified site, subsequent encounter    -  Primary   Patient concerned she has alpha gal. Will rule out wiht lab work at visit today.   Relevant Orders   Alpha-Gal Panel        Follow up plan: Return in about 2 months (around 09/11/2024) for BP Check.

## 2024-07-11 NOTE — Telephone Encounter (Signed)
 FYI Only or Action Required?: FYI only for provider.  Patient was last seen in primary care on 06/06/2024 by Cannady, Jolene T, NP.  Called Nurse Triage reporting No chief complaint on file..  Symptoms began several days ago.  Interventions attempted: Prescription medications: Amlodipine , .  Symptoms are: gradually worsening.  Triage Disposition: No disposition on file.  Patient/caregiver understands and will follow disposition?:    Copied from CRM [id number]. Topic: Clinical - Red Word Triage >> Jul 11, 2024  9:10 AM Myrick T wrote: [facility] that prompted transfer to Nurse Triage: recently bitten by a lone star tick a few days ago and now her feet are swollen on the top. Answer Assessment - Initial Assessment Questions 1. ONSET: When did the swelling start? (e.g., minutes, hours, days)     A couple of days ago  2. LOCATION: What part of the leg is swollen?  Are both legs swollen or just one leg?     Both Feet are swollen  3. SEVERITY: How bad is the swelling? (e.g., localized; mild, moderate, severe)     Moderate  4. REDNESS: Is there redness or signs of infection?     Purplish discoloration, Redness  5. PAIN: Is the swelling painful to touch? If Yes, ask: How painful is it?   (Scale 1-10; mild, moderate or severe)     Denies pain, but states there is a heaviness or a numbness/tingling sensation  6. FEVER: Do you have a fever? If Yes, ask: What is it, how was it measured, and when did it start?      No  7. CAUSE: What do you think is causing the leg swelling?     Unsure  8. MEDICAL HISTORY: Do you have a history of blood clots (e.g., DVT), cancer, heart failure, kidney disease, or liver failure?     Hypertension  9. RECURRENT SYMPTOM: Have you had leg swelling before? If Yes, ask: When was the last time? What happened that time?     No  10. OTHER SYMPTOMS: Do you have any other symptoms? (e.g., chest pain, difficulty breathing)        No  11. PREGNANCY: Is there any chance you are pregnant? When was your last menstrual period?       No and No   Recently went to the Urgent Care for the tick bite and was told to wait for diagnostic testing and is now concerned about her feet swelling.  Protocols used: Leg Swelling and Edema-A-AH

## 2024-07-14 ENCOUNTER — Ambulatory Visit: Payer: Self-pay

## 2024-07-14 ENCOUNTER — Ambulatory Visit: Payer: Self-pay | Admitting: Nurse Practitioner

## 2024-07-14 LAB — ALPHA-GAL PANEL
Allergen Lamb IgE: 0.54 kU/L — AB
Beef IgE: <0.1 kU/L
IgE (Immunoglobulin E), Serum: 646 [IU]/mL — ABNORMAL HIGH (ref 6–495)
O215-IgE Alpha-Gal: <0.1 kU/L
Pork IgE: 0.38 kU/L — AB

## 2024-07-14 NOTE — Telephone Encounter (Signed)
 FYI Only or Action Required?: Action required by provider: lab or test result follow-up needed.  Patient was last seen in primary care on 07/11/2024 by Melvin Pao, NP.  Called Nurse Triage reporting Lab Results.  Symptoms began No symptoms.  Interventions attempted: Nothing.  Symptoms are: stable.  Triage Disposition: Call PCP Within 24 Hours  Patient/caregiver understands and will follow disposition?: Yes  Copied from CRM #[id number]. Topic: Clinical - Lab/Test Results >> Jul 14, 2024  2:05 PM Tiffany S wrote: Reason for CRM: Lab results Reason for Disposition  Caller requesting lab results  (Exception: Routine or non-urgent lab result.)  Answer Assessment - Initial Assessment Questions 1. REASON FOR CALL or QUESTION: What is your reason for calling today? or How can I best     Patient calling to get clarity on provider notes in labs results. Results indicate sensitivity to pork and lamb, no sensitivity to beef.  Provider notes to avoid pork and beef.   2. CALLER: Document the source of call. (e.g., laboratory staff, caregiver or patient).     Patient  Protocols used: PCP Call - No Triage-A-AH

## 2024-07-14 NOTE — Telephone Encounter (Signed)
 Routing to provider to clarify on results.

## 2024-07-17 NOTE — Telephone Encounter (Signed)
 I meant avoid pork and lamb.  No sensitivity to beef.

## 2024-07-17 NOTE — Telephone Encounter (Signed)
 Called and LVM asking for patient to please return my call.   OK for E2C2 to give message if patient calls back.

## 2024-07-18 NOTE — Telephone Encounter (Signed)
 Called and notified patient of results.

## 2024-07-20 ENCOUNTER — Ambulatory Visit: Admitting: Emergency Medicine

## 2024-07-20 VITALS — Ht 62.0 in | Wt 163.0 lb

## 2024-07-20 DIAGNOSIS — Z0000 Encounter for general adult medical examination without abnormal findings: Secondary | ICD-10-CM

## 2024-07-20 NOTE — Patient Instructions (Signed)
 [name] , Thank you for taking time out of your busy schedule to complete your Annual Wellness Visit with me. I enjoyed our conversation and look forward to speaking with you again next year. I, as well as your care team,  appreciate your ongoing commitment to your health goals. Please review the following plan we discussed and let me know if I can assist you in the future. Your Game plan/ To Do List    Referrals: None   Follow up Visits: We will see or speak with you next year for your Next Medicare AWV with our clinical staff Have you seen your provider in the last 6 months (3 months if uncontrolled diabetes)? Yes  Clinician Recommendations:  Schedule a routine eye exam at your earliest convenience with Dr. Lynwood Hasting (phone# [phone]). Aim for 30 minutes of exercise or brisk walking, 6-8 glasses of water, and 5 servings of fruits and vegetables each day.   Please call to schedule your mammogram and bone density scan:  [facility] at [facility] Address: [address], [place], [place] Phone: [phone]  [facility] at [facility] [address] [place], [place] [address] Phone: [phone]       This is a list of the screenings recommended for you:  Health Maintenance  Topic Date Due   Mammogram  08/21/2007   DEXA scan (bone density measurement)  05/29/2024   Zoster (Shingles) Vaccine (1 of 2) 09/03/2024*   DTaP/Tdap/Td vaccine (2 - Tdap) 02/19/2025*   Flu Shot  07/21/2024   Medicare Annual Wellness Visit  07/20/2025   Colon Cancer Screening  09/08/2027   Pneumococcal Vaccine for age over 35  Completed   Hepatitis C Screening  Completed   Hepatitis B Vaccine  Aged Out   HPV Vaccine  Aged Out   Meningitis B Vaccine  Aged Out   COVID-19 Vaccine  Discontinued  *Topic was postponed. The date shown is not the original due date.    Advanced directives: (Declined) Advance directive discussed with you today. Even  though you declined this today, please call our office should you change your mind, and we can give you the proper paperwork for you to fill out. Advance Care Planning is important because it:  [x]  Makes sure you receive the medical care that is consistent with your values, goals, and preferences  [x]  It provides guidance to your family and loved ones and reduces their decisional burden about whether or not they are making the right decisions based on your wishes.  Follow the link provided in your after visit summary or read over the paperwork we have mailed to you to help you started getting your Advance Directives in place. If you need assistance in completing these, please reach out to us  so that we can help you!  See attachments for Preventive Care and Fall Prevention Tips.   Fall Prevention in the Home, Adult Falls can cause injuries and affect people of all ages. There are many simple things that you can do to make your home safe and to help prevent falls. If you need it, ask for help making these changes. What actions can I take to prevent falls? General information Use good lighting in all rooms. Make sure to: Replace any light bulbs that burn out. Turn on lights if it is dark and use night-lights. Keep items that you use often in easy-to-reach places. Lower the shelves around your home if needed. Move furniture so that  there are clear paths around it. Do not keep throw rugs or other things on the floor that can make you trip. If any of your floors are uneven, fix them. Add color or contrast paint or tape to clearly mark and help you see: Grab bars or handrails. First and last steps of staircases. Where the edge of each step is. If you use a ladder or stepladder: Make sure that it is fully opened. Do not climb a closed ladder. Make sure the sides of the ladder are locked in place. Have someone hold the ladder while you use it. Know where your pets are as you move through your  home. What can I do in the bathroom?     Keep the floor dry. Clean up any water that is on the floor right away. Remove soap buildup in the bathtub or shower. Buildup makes bathtubs and showers slippery. Use non-skid mats or decals on the floor of the bathtub or shower. Attach bath mats securely with double-sided, non-slip rug tape. If you need to sit down while you are in the shower, use a non-slip stool. Install grab bars by the toilet and in the bathtub and shower. Do not use towel bars as grab bars. What can I do in the bedroom? Make sure that you have a light by your bed that is easy to reach. Do not use any sheets or blankets on your bed that hang to the floor. Have a firm bench or chair with side arms that you can use for support when you get dressed. What can I do in the kitchen? Clean up any spills right away. If you need to reach something above you, use a sturdy step stool that has a grab bar. Keep electrical cables out of the way. Do not use floor polish or wax that makes floors slippery. What can I do with my stairs? Do not leave anything on the stairs. Make sure that you have a light switch at the top and the bottom of the stairs. Have them installed if you do not have them. Make sure that there are handrails on both sides of the stairs. Fix handrails that are broken or loose. Make sure that handrails are as long as the staircases. Install non-slip stair treads on all stairs in your home if they do not have carpet. Avoid having throw rugs at the top or bottom of stairs, or secure the rugs with carpet tape to prevent them from moving. Choose a carpet design that does not hide the edge of steps on the stairs. Make sure that carpet is firmly attached to the stairs. Fix any carpet that is loose or worn. What can I do on the outside of my home? Use bright outdoor lighting. Repair the edges of walkways and driveways and fix any cracks. Clear paths of anything that can make you  trip, such as tools or rocks. Add color or contrast paint or tape to clearly mark and help you see high doorway thresholds. Trim any bushes or trees on the main path into your home. Check that handrails are securely fastened and in good repair. Both sides of all steps should have handrails. Install guardrails along the edges of any raised decks or porches. Have leaves, snow, and ice cleared regularly. Use sand, salt, or ice melt on walkways during winter months if you live where there is ice and snow. In the garage, clean up any spills right away, including grease or oil spills. What other actions  can I take? Review your medicines with your health care provider. Some medicines can make you confused or feel dizzy. This can increase your chance of falling. Wear closed-toe shoes that fit well and support your feet. Wear shoes that have rubber soles and low heels. Use a cane, walker, scooter, or crutches that help you move around if needed. Talk with your provider about other ways that you can decrease your risk of falls. This may include seeing a physical therapist to learn to do exercises to improve movement and strength. Where to find more information Centers for Disease Control and Prevention, [employer]: TonerPromos.no [employer] on Aging: BaseRingTones.pl National Institute on Aging: BaseRingTones.pl Contact a health care provider if: You are afraid of falling at home. You feel weak, drowsy, or dizzy at home. You fall at home. Get help right away if you: Lose consciousness or have trouble moving after a fall. Have a fall that causes a head injury. These symptoms may be an emergency. Get help right away. Call 911. Do not wait to see if the symptoms will go away. Do not drive yourself to the hospital. This information is not intended to replace advice given to you by your health care provider. Make sure you discuss any questions you have with your health care provider. Document Revised: 08/10/2022  Document Reviewed: 08/10/2022 Elsevier Patient Education  2024 [employer].

## 2024-07-20 NOTE — Progress Notes (Signed)
 Subjective:   Lisa Bass is a 73 y.o. who presents for a Medicare Wellness preventive visit.  As a reminder, Annual Wellness Visits don't include a physical exam, and some assessments may be limited, especially if this visit is performed virtually. We may recommend an in-person follow-up visit with your provider if needed.  Visit Complete: Virtual I connected with  Lisa Bass on 07/20/24 by a audio enabled telemedicine application and verified that I am speaking with the correct person using two identifiers.  Patient Location: Home  Provider Location: Home Office  I discussed the limitations of evaluation and management by telemedicine. The patient expressed understanding and agreed to proceed.  Vital Signs: Because this visit was a virtual/telehealth visit, some criteria may be missing or patient reported. Any vitals not documented were not able to be obtained and vitals that have been documented are patient reported.  VideoDeclined- This patient declined Librarian, academic. Therefore the visit was completed with audio only.  Persons Participating in Visit: Patient.  AWV Questionnaire: Yes: Patient Medicare AWV questionnaire was completed by the patient on 07/16/24; I have confirmed that all information answered by patient is correct and no changes since this date.  Cardiac Risk Factors include: advanced age (>12men, >96 women);dyslipidemia;hypertension     Objective:    Today's Vitals   07/20/24 1443  Weight: 163 lb (73.9 kg)  Height: 5' 2 (1.575 m)   Body mass index is 29.81 kg/m.     07/20/2024    2:55 PM 05/21/2024   10:16 AM 04/27/2024   10:54 AM 07/13/2022   11:10 AM 07/13/2022   11:04 AM 07/07/2021    1:01 PM 09/21/2020    3:42 PM  Advanced Directives  Does Patient Have a Medical Advance Directive? No No No No No No No  Would patient like information on creating a medical advance directive? No - Patient declined  No - Patient  declined  No - Patient declined      Current Medications (verified) Outpatient Encounter Medications as of 07/20/2024  Medication Sig   amLODipine  (NORVASC ) 5 MG tablet Take 1 tablet (5 mg total) by mouth daily.   aspirin  81 MG tablet Take 81 mg by mouth daily.   Cholecalciferol  1.25 MG (50000 UT) TABS Take 1 tablet by mouth once a week.   cyanocobalamin  (VITAMIN B12) 1000 MCG tablet Take 1 tablet (1,000 mcg total) by mouth daily.   ezetimibe  (ZETIA ) 10 MG tablet TAKE 1 TABLET BY MOUTH ONCE DAILY   losartan  (COZAAR ) 100 MG tablet Take 1 tablet (100 mg total) by mouth daily.   Multiple Vitamin (MULTIVITAMIN) tablet Take 1 tablet by mouth daily.   nystatin  cream (MYCOSTATIN ) Apply 1 Application topically 2 (two) times daily. (Patient taking differently: Apply 1 Application topically 2 (two) times daily. PRN)   PARoxetine  (PAXIL ) 40 MG tablet Take 0.5 tablets (20 mg total) by mouth daily. Take 1/2 dose   rosuvastatin  (CRESTOR ) 10 MG tablet Take 1 tablet (10 MG) by mouth on Monday, Wednesday, and Friday.   triamcinolone  cream (KENALOG ) 0.1 % Apply 1 Application topically 2 (two) times daily. (Patient taking differently: Apply 1 Application topically 2 (two) times daily. PRN)   No facility-administered encounter medications on file as of 07/20/2024.    Allergies (verified) Penicillins, Biaxin [clarithromycin], Lisinopril, and Pork-derived products   History: Past Medical History:  Diagnosis Date   Allergy    Anxiety    medication   Asthma    Cataract  CHF (congestive heart failure) (HCC)    Depression    Diabetes mellitus without complication (HCC)    pre diabetes   Hyperlipidemia    Hypertension    OCD (obsessive compulsive disorder)    Palpitations    Perimenopause    Past Surgical History:  Procedure Laterality Date   CATARACT EXTRACTION, BILATERAL  02/2021   CHOLECYSTECTOMY     COLONOSCOPY WITH PROPOFOL  N/A 09/07/2017   Procedure: COLONOSCOPY WITH PROPOFOL ;  Surgeon:  Jinny Carmine, MD;  Location: [facility] ENDOSCOPY;  Service: Endoscopy;  Laterality: N/A;   EYE SURGERY  02/20/2021   surgery cateracts   open heart surgery     ROOT CANAL     x2   Family History  Problem Relation Age of Onset   Hypertension Mother    Dementia Father    Hypertension Father    Diabetes Father    Depression Father    GI problems Daughter    Heart disease Maternal Grandfather    Depression Sister    Social History   Socioeconomic History   Marital status: Married    Spouse name: Toby   Number of children: 1   Years of education: Not on file   Highest education level: 12th grade  Occupational History   Occupation: retired   Tobacco Use   Smoking status: Never   Smokeless tobacco: Never  Vaping Use   Vaping status: Never Used  Substance and Sexual Activity   Alcohol use: No   Drug use: No   Sexual activity: Not Currently  Other Topics Concern   Not on file  Social History Narrative   Very active in church    Social Drivers of Health   Financial Resource Strain: Low Risk  (07/20/2024)   Overall Financial Resource Strain (CARDIA)    Difficulty of Paying Living Expenses: Not hard at all  Food Insecurity: No Food Insecurity (07/20/2024)   Hunger Vital Sign    Worried About Running Out of Food in the Last Year: Never true    Ran Out of Food in the Last Year: Never true  Transportation Needs: No Transportation Needs (07/20/2024)   PRAPARE - Administrator, Civil Service (Medical): No    Lack of Transportation (Non-Medical): No  Physical Activity: Inactive (07/20/2024)   Exercise Vital Sign    Days of Exercise per Week: 0 days    Minutes of Exercise per Session: 0 min  Stress: No Stress Concern Present (07/20/2024)   [employer] of Occupational Health - Occupational Stress Questionnaire    Feeling of Stress: Only a little  Social Connections: Socially Integrated (07/20/2024)   Social Connection and Isolation Panel    Frequency of Communication  with Friends and Family: More than three times a week    Frequency of Social Gatherings with Friends and Family: More than three times a week    Attends Religious Services: More than 4 times per year    Active Member of [employer] or Organizations: Yes    Attends Engineer, structural: More than 4 times per year    Marital Status: Married    Tobacco Counseling Counseling given: Not Answered    Clinical Intake:  Pre-visit preparation completed: Yes  Pain : No/denies pain     BMI - recorded: 29.81 Nutritional Status: BMI 25 -29 Overweight Nutritional Risks: None Diabetes: No  Lab Results  Component Value Date   HGBA1C 5.3 02/22/2024   HGBA1C 6.3 (H) 07/20/2023   HGBA1C 5.7 (H) 11/10/2021  How often do you need to have someone help you when you read instructions, pamphlets, or other written materials from your doctor or pharmacy?: 1 - Never  Interpreter Needed?: No  Information entered by :: Vina Ned, CMA   Activities of Daily Living     07/20/2024    2:45 PM 07/16/2024    3:22 PM  In your present state of health, do you have any difficulty performing the following activities:  Hearing? 0 0  Vision? 0 0  Difficulty concentrating or making decisions? 0 0  Walking or climbing stairs? 0 0  Dressing or bathing? 0 0  Doing errands, shopping? 0 0  Preparing Food and eating ? N N  Using the Toilet? N N  In the past six months, have you accidently leaked urine? N N  Do you have problems with loss of bowel control? N N  Managing your Medications? N N  Managing your Finances? N N  Housekeeping or managing your Housekeeping? N N    Patient Care Team: Cannady, Jolene T, NP as PCP - General (Nurse Practitioner) Florencio Cara BIRCH, MD as Consulting Physician (Cardiology) Sharlot Agent, OD (Optometry)  I have updated your Care Teams any recent Medical Services you may have received from other providers in the past year.     Assessment:   This is a routine  wellness examination for Lisa Bass.  Hearing/Vision screen Hearing Screening - Comments:: Denies hearing loss   Vision Screening - Comments:: Gets routine eye exams, Dr. Agent Sharlot, [facility] [place]   Goals Addressed               This Visit's Progress     Increase physical activity (pt-stated)         Depression Screen     07/20/2024    2:53 PM 06/06/2024    4:10 PM 02/22/2024    9:14 AM 10/29/2023   11:50 AM 10/08/2023    3:30 PM 08/24/2023   11:20 AM 07/20/2023    3:21 PM  PHQ 2/9 Scores  PHQ - 2 Score 0 0 1 2 0 0 1  PHQ- 9 Score 0 2 1 3  0 3 3    Fall Risk     07/20/2024    2:57 PM 07/16/2024    3:22 PM 02/22/2024    9:14 AM 10/08/2023    3:30 PM 07/20/2023    3:21 PM  Fall Risk   Falls in the past year? 0 0 0 0 0  Number falls in past yr: 0 0 0 0 0  Injury with Fall? 0 0 0 0 0  Risk for fall due to : No Fall Risks  No Fall Risks No Fall Risks No Fall Risks  Follow up Falls evaluation completed  Falls evaluation completed Falls prevention discussed;Education provided;Falls evaluation completed Falls evaluation completed    MEDICARE RISK AT HOME:  Medicare Risk at Home Any stairs in or around the home?: Yes If so, are there any without handrails?: No Home free of loose throw rugs in walkways, pet beds, electrical cords, etc?: Yes Adequate lighting in your home to reduce risk of falls?: Yes Life alert?: No Use of a cane, walker or w/c?: No Grab bars in the bathroom?: No Shower chair or bench in shower?: No Elevated toilet seat or a handicapped toilet?: No  TIMED UP AND GO:  Was the test performed?  No  Cognitive Function: 6CIT completed        07/20/2024    2:57  PM 07/20/2023    4:00 PM 07/13/2022   11:04 AM 07/07/2021    1:05 PM 06/21/2020   10:00 AM  6CIT Screen  What Year? 0 points 0 points 0 points 0 points 0 points  What month? 0 points 0 points 0 points 0 points 0 points  What time? 0 points 0 points 0 points 0 points 0 points  Count back from 20 0  points 0 points 0 points 0 points 0 points  Months in reverse 0 points 0 points 0 points 0 points 0 points  Repeat phrase 0 points 2 points 0 points 2 points 0 points  Total Score 0 points 2 points 0 points 2 points 0 points    Immunizations Immunization History  Administered Date(s) Administered   Pneumococcal Conjugate-13 09/23/2015   Pneumococcal Polysaccharide-23 12/22/2016   Td 03/11/1999    Screening Tests Health Maintenance  Topic Date Due   MAMMOGRAM  08/21/2007   DEXA SCAN  05/29/2024   Zoster Vaccines- Shingrix (1 of 2) 09/03/2024 (Originally 08/19/1970)   DTaP/Tdap/Td (2 - Tdap) 02/19/2025 (Originally 03/10/2009)   INFLUENZA VACCINE  07/21/2024   Medicare Annual Wellness (AWV)  07/20/2025   Colonoscopy  09/08/2027   Pneumococcal Vaccine: 50+ Years  Completed   Hepatitis C Screening  Completed   Hepatitis B Vaccines  Aged Out   HPV VACCINES  Aged Out   Meningococcal B Vaccine  Aged Out   COVID-19 Vaccine  Discontinued    Health Maintenance  Health Maintenance Due  Topic Date Due   MAMMOGRAM  08/21/2007   DEXA SCAN  05/29/2024   Health Maintenance Items Addressed: See Nurse Notes at the end of this note  Additional Screening:  Vision Screening: Recommended annual ophthalmology exams for early detection of glaucoma and other disorders of the eye. Would you like a referral to an eye doctor? No    Dental Screening: Recommended annual dental exams for proper oral hygiene  Community Resource Referral / Chronic Care Management: CRR required this visit?  No   CCM required this visit?  No   Plan:    I have personally reviewed and noted the following in the patient's chart:   Medical and social history Use of alcohol, tobacco or illicit drugs  Current medications and supplements including opioid prescriptions. Patient is not currently taking opioid prescriptions. Functional ability and status Nutritional status Physical activity Advanced  directives List of other physicians Hospitalizations, surgeries, and ER visits in previous 12 months Vitals Screenings to include cognitive, depression, and falls Referrals and appointments  In addition, I have reviewed and discussed with patient certain preventive protocols, quality metrics, and best practice recommendations. A written personalized care plan for preventive services as well as general preventive health recommendations were provided to patient.   Vina Ned, CMA   07/20/2024   After Visit Summary: (MyChart) Due to this being a telephonic visit, the after visit summary with patients personalized plan was offered to patient via MyChart   Notes:  Gave ph# to call and schedule MMG and DEXA (ordered 02/22/24) Declined Tdap and shingles vaccines

## 2024-07-29 NOTE — Patient Instructions (Signed)
 Be Involved in Caring For Your Health:  Taking Medications When medications are taken as directed, they can greatly improve your health. But if they are not taken as prescribed, they may not work. In some cases, not taking them correctly can be harmful. To help ensure your treatment remains effective and safe, understand your medications and how to take them. Bring your medications to each visit for review by your provider.  Your lab results, notes, and after visit summary will be available on My Chart. We strongly encourage you to use this feature. If lab results are abnormal the clinic will contact you with the appropriate steps. If the clinic does not contact you assume the results are satisfactory. You can always view your results on My Chart. If you have questions regarding your health or results, please contact the clinic during office hours. You can also ask questions on My Chart.  We at [facility] are grateful that you chose [place] to provide your care. We strive to provide evidence-based and compassionate care and are always looking for feedback. If you get a survey from the clinic please complete this so we can hear your opinions.  DASH Eating Plan DASH stands for Dietary Approaches to Stop Hypertension. The DASH eating plan is a healthy eating plan that has been shown to: Lower high blood pressure (hypertension). Reduce your risk for type 2 diabetes, heart disease, and stroke. Help with weight loss. What are tips for following this plan? Reading food labels Check food labels for the amount of salt (sodium) per serving. Choose foods with less than 5 percent of the Daily Value (DV) of sodium. In general, foods with less than 300 milligrams (mg) of sodium per serving fit into this eating plan. To find whole grains, look for the word "whole" as the first word in the ingredient list. Shopping Buy products labeled as "low-sodium" or "no salt added." Buy fresh foods. Avoid canned  foods and pre-made or frozen meals. Cooking Try not to add salt when you cook. Use salt-free seasonings or herbs instead of table salt or sea salt. Check with your health care provider or pharmacist before using salt substitutes. Do not fry foods. Cook foods in healthy ways, such as baking, boiling, grilling, roasting, or broiling. Cook using oils that are good for your heart. These include olive, canola, avocado, soybean, and sunflower oil. Meal planning  Eat a balanced diet. This should include: 4 or more servings of fruits and 4 or more servings of vegetables each day. Try to fill half of your plate with fruits and vegetables. 6-8 servings of whole grains each day. 6 or less servings of lean meat, poultry, or fish each day. 1 oz is 1 serving. A 3 oz (85 g) serving of meat is about the same size as the palm of your hand. One egg is 1 oz (28 g). 2-3 servings of low-fat dairy each day. One serving is 1 cup (237 mL). 1 serving of nuts, seeds, or beans 5 times each week. 2-3 servings of heart-healthy fats. Healthy fats called omega-3 fatty acids are found in foods such as walnuts, flaxseeds, fortified milks, and eggs. These fats are also found in cold-water fish, such as sardines, salmon, and mackerel. Limit how much you eat of: Canned or prepackaged foods. Food that is high in trans fat, such as fried foods. Food that is high in saturated fat, such as fatty meat. Desserts and other sweets, sugary drinks, and other foods with added sugar. Full-fat  dairy products. Do not salt foods before eating. Do not eat more than 4 egg yolks a week. Try to eat at least 2 vegetarian meals a week. Eat more home-cooked food and less restaurant, buffet, and fast food. Lifestyle When eating at a restaurant, ask if your food can be made with less salt or no salt. If you drink alcohol: Limit how much you have to: 0-1 drink a day if you are female. 0-2 drinks a day if you are female. Know how much alcohol is in  your drink. In the [place] one drink is one 12 oz bottle of beer (355 mL), one 5 oz glass of wine (148 mL), or one 1 oz glass of hard liquor (44 mL). General information Avoid eating more than 2,300 mg of salt a day. If you have hypertension, you may need to reduce your sodium intake to 1,500 mg a day. Work with your provider to stay at a healthy body weight or lose weight. Ask what the best weight range is for you. On most days of the week, get at least 30 minutes of exercise that causes your heart to beat faster. This may include walking, swimming, or biking. Work with your provider or dietitian to adjust your eating plan to meet your specific calorie needs. What foods should I eat? Fruits All fresh, dried, or frozen fruit. Canned fruits that are in their natural juice and do not have sugar added to them. Vegetables Fresh or frozen vegetables that are raw, steamed, roasted, or grilled. Low-sodium or reduced-sodium tomato and vegetable juice. Low-sodium or reduced-sodium tomato sauce and tomato paste. Low-sodium or reduced-sodium canned vegetables. Grains Whole-grain or whole-wheat bread. Whole-grain or whole-wheat pasta. Brown rice. Orpah Cobb. Bulgur. Whole-grain and low-sodium cereals. Pita bread. Low-fat, low-sodium crackers. Whole-wheat flour tortillas. Meats and other proteins Skinless chicken or [place]. Ground chicken or [place]. Pork with fat trimmed off. Fish and seafood. Egg whites. Dried beans, peas, or lentils. Unsalted nuts, nut butters, and seeds. Unsalted canned beans. Lean cuts of beef with fat trimmed off. Low-sodium, lean precooked or cured meat, such as sausages or meat loaves. Dairy Low-fat (1%) or fat-free (skim) milk. Reduced-fat, low-fat, or fat-free cheeses. Nonfat, low-sodium ricotta or cottage cheese. Low-fat or nonfat yogurt. Low-fat, low-sodium cheese. Fats and oils Soft margarine without trans fats. Vegetable oil. Reduced-fat, low-fat, or light mayonnaise and salad  dressings (reduced-sodium). Canola, safflower, olive, avocado, soybean, and sunflower oils. Avocado. Seasonings and condiments Herbs. Spices. Seasoning mixes without salt. Other foods Unsalted popcorn and pretzels. Fat-free sweets. The items listed above may not be all the foods and drinks you can have. Talk to a dietitian to learn more. What foods should I avoid? Fruits Canned fruit in a light or heavy syrup. Fried fruit. Fruit in cream or butter sauce. Vegetables Creamed or fried vegetables. Vegetables in a cheese sauce. Regular canned vegetables that are not marked as low-sodium or reduced-sodium. Regular canned tomato sauce and paste that are not marked as low-sodium or reduced-sodium. Regular tomato and vegetable juices that are not marked as low-sodium or reduced-sodium. Rosita Fire. Olives. Grains Baked goods made with fat, such as croissants, muffins, or some breads. Dry pasta or rice meal packs. Meats and other proteins Fatty cuts of meat. Ribs. Fried meat. Tomasa Blase. Bologna, salami, and other precooked or cured meats, such as sausages or meat loaves, that are not lean and low in sodium. Fat from the back of a pig (fatback). Bratwurst. Salted nuts and seeds. Canned beans with added salt. Canned  or smoked fish. Whole eggs or egg yolks. Chicken or [place] with skin. Dairy Whole or 2% milk, cream, and half-and-half. Whole or full-fat cream cheese. Whole-fat or sweetened yogurt. Full-fat cheese. Nondairy creamers. Whipped toppings. Processed cheese and cheese spreads. Fats and oils Butter. Stick margarine. Lard. Shortening. Ghee. Bacon fat. Tropical oils, such as coconut, palm kernel, or palm oil. Seasonings and condiments Onion salt, garlic salt, seasoned salt, table salt, and sea salt. Worcestershire sauce. Tartar sauce. Barbecue sauce. Teriyaki sauce. Soy sauce, including reduced-sodium soy sauce. Steak sauce. Canned and packaged gravies. Fish sauce. Oyster sauce. Cocktail sauce. Store-bought  horseradish. Ketchup. Mustard. Meat flavorings and tenderizers. Bouillon cubes. Hot sauces. Pre-made or packaged marinades. Pre-made or packaged taco seasonings. Relishes. Regular salad dressings. Other foods Salted popcorn and pretzels. The items listed above may not be all the foods and drinks you should avoid. Talk to a dietitian to learn more. Where to find more information National Heart, Lung, and Blood Institute (NHLBI): BuffaloDryCleaner.gl American Heart Association (AHA): heart.org Academy of Nutrition and Dietetics: eatright.org National Kidney Foundation (NKF): kidney.org This information is not intended to replace advice given to you by your health care provider. Make sure you discuss any questions you have with your health care provider. Document Revised: 12/24/2022 Document Reviewed: 12/24/2022 Elsevier Patient Education  2024 [employer].

## 2024-08-02 ENCOUNTER — Encounter: Payer: Self-pay | Admitting: Nurse Practitioner

## 2024-08-02 ENCOUNTER — Ambulatory Visit (INDEPENDENT_AMBULATORY_CARE_PROVIDER_SITE_OTHER): Admitting: Nurse Practitioner

## 2024-08-02 VITALS — BP 136/68 | HR 66 | Temp 97.8°F | Ht 61.0 in | Wt 160.4 lb

## 2024-08-02 DIAGNOSIS — I10 Essential (primary) hypertension: Secondary | ICD-10-CM

## 2024-08-02 MED ORDER — AMLODIPINE BESYLATE 5 MG PO TABS: 5.0000 mg | ORAL_TABLET | Freq: Every day | ORAL | 2 refills | Status: DC

## 2024-08-02 MED ORDER — LOSARTAN POTASSIUM 100 MG PO TABS: 100.0000 mg | ORAL_TABLET | Freq: Every day | ORAL | 4 refills | Status: DC

## 2024-08-02 MED ORDER — EZETIMIBE 10 MG PO TABS: 10.0000 mg | ORAL_TABLET | Freq: Every day | ORAL | 3 refills | Status: AC

## 2024-08-02 MED ORDER — ROSUVASTATIN CALCIUM 10 MG PO TABS: ORAL_TABLET | ORAL | 4 refills | Status: AC

## 2024-08-02 NOTE — Progress Notes (Signed)
 BP 136/68 (BP Location: Left Arm, Patient Position: Sitting)   Pulse 66   Temp 97.8 F (36.6 C) (Oral)   Ht 5' 1 (1.549 m)   Wt 160 lb 6.4 oz (72.8 kg)   LMP  (LMP Unknown)   SpO2 98%   BMI 30.31 kg/m    Subjective:    Patient ID: Lisa Bass, female    DOB: 1951-06-17, 73 y.o.   MRN: [id number]  HPI: Lisa Bass is a 73 y.o. female  Chief Complaint  Patient presents with   Hypertension   HYPERTENSION without Chronic Kidney Disease Presents for follow-up on medication changes with Amlodipine  reduced to 5 MG on 07/11/24 due to swelling in top of feet. Taking Losartan  100 MG daily and Amlodipine  5 MG daily.  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  Hypertension status: stable  Satisfied with current treatment? yes Duration of hypertension: chronic BP monitoring frequency:  daily - 2-3 times daily BP range: 126/72 to 155/82 -- not consistent BP medication side effects:  no Medication compliance: good compliance Aspirin : yes Recurrent headaches: no Visual changes: no Palpitations: no Dyspnea: no Chest pain: no Lower extremity edema: no Dizzy/lightheaded: no   Relevant past medical, surgical, family and social history reviewed and updated as indicated. Interim medical history since our last visit reviewed. Allergies and medications reviewed and updated.  Review of Systems  Constitutional:  Negative for activity change, appetite change, diaphoresis, fatigue and fever.  Respiratory:  Negative for cough, chest tightness and shortness of breath.   Cardiovascular:  Negative for chest pain, palpitations and leg swelling.  Gastrointestinal: Negative.   Neurological: Negative.   Psychiatric/Behavioral: Negative.     Per HPI unless specifically indicated above     Objective:     BP 136/68 (BP Location: Left Arm, Patient Position: Sitting)   Pulse 66   Temp 97.8 F (36.6 C) (Oral)   Ht 5' 1 (1.549 m)   Wt 160 lb 6.4 oz (72.8 kg)   LMP  (LMP  Unknown)   SpO2 98%   BMI 30.31 kg/m   Wt Readings from Last 3 Encounters:  08/02/24 160 lb 6.4 oz (72.8 kg)  07/20/24 163 lb (73.9 kg)  07/11/24 161 lb (73 kg)    Physical Exam Vitals and nursing note reviewed.  Constitutional:      General: She is awake. She is not in acute distress.    Appearance: She is well-developed and well-groomed. She is obese. She is not ill-appearing or toxic-appearing.  HENT:     Head: Normocephalic.     Right Ear: Hearing and external ear normal.     Left Ear: Hearing and external ear normal.  Eyes:     General: Lids are normal.        Right eye: No discharge.        Left eye: No discharge.     Conjunctiva/sclera: Conjunctivae normal.     Pupils: Pupils are equal, round, and reactive to light.  Neck:     Thyroid : No thyromegaly.     Vascular: No carotid bruit.  Cardiovascular:     Rate and Rhythm: Normal rate and regular rhythm.     Heart sounds: Normal heart sounds. No murmur heard.    No gallop.  Pulmonary:     Effort: Pulmonary effort is normal. No accessory muscle usage or respiratory distress.     Breath sounds: Normal breath sounds.  Abdominal:     General: Bowel sounds  are normal. There is no distension.     Palpations: Abdomen is soft.     Tenderness: There is no abdominal tenderness.  Musculoskeletal:     Cervical back: Normal range of motion and neck supple.     Right lower leg: No edema.     Left lower leg: No edema.  Lymphadenopathy:     Cervical: No cervical adenopathy.  Skin:    General: Skin is warm and dry.  Neurological:     Mental Status: She is alert and oriented to person, place, and time.     Deep Tendon Reflexes: Reflexes are normal and symmetric.     Reflex Scores:      Brachioradialis reflexes are 2+ on the right side and 2+ on the left side.      Patellar reflexes are 2+ on the right side and 2+ on the left side. Psychiatric:        Attention and Perception: Attention normal.        Mood and Affect: Mood  normal.        Speech: Speech normal.        Behavior: Behavior normal. Behavior is cooperative.        Thought Content: Thought content normal.    Results for orders placed or performed in visit on 07/11/24  Alpha-Gal Panel   Collection Time: 07/11/24  1:21 PM  Result Value Ref Range   Class Description Allergens Comment    IgE (Immunoglobulin E), Serum 646 (H) 6 - 495 IU/mL   Pork IgE 0.38 (A) Class I kU/L   Beef IgE <0.10 Class 0 kU/L   Allergen Lamb IgE 0.54 (A) Class I kU/L   O215-IgE Alpha-Gal <0.10 Class 0 kU/L      Assessment & Plan:   Problem List Items Addressed This Visit       Cardiovascular and Mediastinum   Hypertension - Primary   Chronic, ongoing.  BP improving but continues to have some elevations on occasion.  Will continue Losartan  100 MG daily and Amlodipine  5 MG daily for now, as she prefers not to add on medication.  Reiterated importance of taking these daily to prevent BP elevations which could lead to acute stroke.  Recommend she continue to monitor BP daily at home and notify provider if consistent above 130/80.  Labs: obtain next visit.  Recommend focus on DASH diet and modest weight loss.  Continue collaboration with cardiology, appreciate their input.  Urine ALB 28 February 2024.  Could consider changing Losartan  to Valsartan next visit OR could try a low dose of Spironolactone, but would need to monitor K+ closely with this.  In past did not tolerate HCTZ.      Relevant Medications   amLODipine  (NORVASC ) 5 MG tablet   ezetimibe  (ZETIA ) 10 MG tablet   losartan  (COZAAR ) 100 MG tablet   rosuvastatin  (CRESTOR ) 10 MG tablet       Follow up plan: Return for as scheduled September 9th for physical.

## 2024-08-02 NOTE — Assessment & Plan Note (Signed)
 Chronic, ongoing.  BP improving but continues to have some elevations on occasion.  Will continue Losartan  100 MG daily and Amlodipine  5 MG daily for now, as she prefers not to add on medication.  Reiterated importance of taking these daily to prevent BP elevations which could lead to acute stroke.  Recommend she continue to monitor BP daily at home and notify provider if consistent above 130/80.  Labs: obtain next visit.  Recommend focus on DASH diet and modest weight loss.  Continue collaboration with cardiology, appreciate their input.  Urine ALB 28 February 2024.  Could consider changing Losartan  to Valsartan next visit OR could try a low dose of Spironolactone, but would need to monitor K+ closely with this.  In past did not tolerate HCTZ.

## 2024-08-18 ENCOUNTER — Encounter: Payer: Self-pay | Admitting: Nurse Practitioner

## 2024-08-18 MED ORDER — DOXYCYCLINE HYCLATE 100 MG PO TABS
100.0000 mg | ORAL_TABLET | Freq: Two times a day (BID) | ORAL | 0 refills | Status: AC
Start: 2024-08-18 — End: 2024-08-23

## 2024-08-24 ENCOUNTER — Encounter: Payer: Self-pay | Admitting: Nurse Practitioner

## 2024-08-24 MED ORDER — HYDROCHLOROTHIAZIDE 25 MG PO TABS: 12.5000 mg | ORAL_TABLET | Freq: Every day | ORAL | 3 refills | Status: DC

## 2024-08-27 DIAGNOSIS — Z91018 Allergy to other foods: Secondary | ICD-10-CM | POA: Insufficient documentation

## 2024-08-27 NOTE — Patient Instructions (Signed)
Please call to schedule your mammogram and/or bone density: [facility] at [facility]  Address: [address], [place], [place] [address] Phone: [phone]  [facility] [address] [place],  [place]  [address] Phone: [phone]    Be Involved in Caring For Your Health:  Taking Medications When medications are taken as directed, they can greatly improve your health. But if they are not taken as prescribed, they may not work. In some cases, not taking them correctly can be harmful. To help ensure your treatment remains effective and safe, understand your medications and how to take them. Bring your medications to each visit for review by your provider.  Your lab results, notes, and after visit summary will be available on My Chart. We strongly encourage you to use this feature. If lab results are abnormal the clinic will contact you with the appropriate steps. If the clinic does not contact you assume the results are satisfactory. You can always view your results on My Chart. If you have questions regarding your health or results, please contact the clinic during office hours. You can also ask questions on My Chart.  We at [facility] are grateful that you chose [place] to provide your care. We strive to provide evidence-based and compassionate care and are always looking for feedback. If you get a survey from the clinic please complete this so we can hear your opinions.  DASH Eating Plan DASH stands for Dietary Approaches to Stop Hypertension. The DASH eating plan is a healthy eating plan that has been shown to: Lower high blood pressure (hypertension). Reduce your risk for type 2 diabetes, heart disease, and stroke. Help with weight loss. What are tips for following this plan? Reading food labels Check food labels for the amount of salt (sodium) per serving. Choose foods with less than 5 percent of the Daily  Value (DV) of sodium. In general, foods with less than 300 milligrams (mg) of sodium per serving fit into this eating plan. To find whole grains, look for the word "whole" as the first word in the ingredient list. Shopping Buy products labeled as "low-sodium" or "no salt added." Buy fresh foods. Avoid canned foods and pre-made or frozen meals. Cooking Try not to add salt when you cook. Use salt-free seasonings or herbs instead of table salt or sea salt. Check with your health care provider or pharmacist before using salt substitutes. Do not fry foods. Cook foods in healthy ways, such as baking, boiling, grilling, roasting, or broiling. Cook using oils that are good for your heart. These include olive, canola, avocado, soybean, and sunflower oil. Meal planning  Eat a balanced diet. This should include: 4 or more servings of fruits and 4 or more servings of vegetables each day. Try to fill half of your plate with fruits and vegetables. 6-8 servings of whole grains each day. 6 or less servings of lean meat, poultry, or fish each day. 1 oz is 1 serving. A 3 oz (85 g) serving of meat is about the same size as the palm of your hand. One egg is 1 oz (28 g). 2-3 servings of low-fat dairy each day. One serving is 1 cup (237 mL). 1 serving of nuts, seeds, or beans 5 times each week. 2-3 servings of heart-healthy fats. Healthy fats called omega-3 fatty acids are found in foods such as walnuts, flaxseeds, fortified milks, and eggs. These fats are also found in cold-water fish,  such as sardines, salmon, and mackerel. Limit how much you eat of: Canned or prepackaged foods. Food that is high in trans fat, such as fried foods. Food that is high in saturated fat, such as fatty meat. Desserts and other sweets, sugary drinks, and other foods with added sugar. Full-fat dairy products. Do not salt foods before eating. Do not eat more than 4 egg yolks a week. Try to eat at least 2 vegetarian meals a week. Eat  more home-cooked food and less restaurant, buffet, and fast food. Lifestyle When eating at a restaurant, ask if your food can be made with less salt or no salt. If you drink alcohol: Limit how much you have to: 0-1 drink a day if you are female. 0-2 drinks a day if you are female. Know how much alcohol is in your drink. In the [place] one drink is one 12 oz bottle of beer (355 mL), one 5 oz glass of wine (148 mL), or one 1 oz glass of hard liquor (44 mL). General information Avoid eating more than 2,300 mg of salt a day. If you have hypertension, you may need to reduce your sodium intake to 1,500 mg a day. Work with your provider to stay at a healthy body weight or lose weight. Ask what the best weight range is for you. On most days of the week, get at least 30 minutes of exercise that causes your heart to beat faster. This may include walking, swimming, or biking. Work with your provider or dietitian to adjust your eating plan to meet your specific calorie needs. What foods should I eat? Fruits All fresh, dried, or frozen fruit. Canned fruits that are in their natural juice and do not have sugar added to them. Vegetables Fresh or frozen vegetables that are raw, steamed, roasted, or grilled. Low-sodium or reduced-sodium tomato and vegetable juice. Low-sodium or reduced-sodium tomato sauce and tomato paste. Low-sodium or reduced-sodium canned vegetables. Grains Whole-grain or whole-wheat bread. Whole-grain or whole-wheat pasta. Brown rice. Orpah Cobb. Bulgur. Whole-grain and low-sodium cereals. Pita bread. Low-fat, low-sodium crackers. Whole-wheat flour tortillas. Meats and other proteins Skinless chicken or [place]. Ground chicken or [place]. Pork with fat trimmed off. Fish and seafood. Egg whites. Dried beans, peas, or lentils. Unsalted nuts, nut butters, and seeds. Unsalted canned beans. Lean cuts of beef with fat trimmed off. Low-sodium, lean precooked or cured meat, such as sausages or meat  loaves. Dairy Low-fat (1%) or fat-free (skim) milk. Reduced-fat, low-fat, or fat-free cheeses. Nonfat, low-sodium ricotta or cottage cheese. Low-fat or nonfat yogurt. Low-fat, low-sodium cheese. Fats and oils Soft margarine without trans fats. Vegetable oil. Reduced-fat, low-fat, or light mayonnaise and salad dressings (reduced-sodium). Canola, safflower, olive, avocado, soybean, and sunflower oils. Avocado. Seasonings and condiments Herbs. Spices. Seasoning mixes without salt. Other foods Unsalted popcorn and pretzels. Fat-free sweets. The items listed above may not be all the foods and drinks you can have. Talk to a dietitian to learn more. What foods should I avoid? Fruits Canned fruit in a light or heavy syrup. Fried fruit. Fruit in cream or butter sauce. Vegetables Creamed or fried vegetables. Vegetables in a cheese sauce. Regular canned vegetables that are not marked as low-sodium or reduced-sodium. Regular canned tomato sauce and paste that are not marked as low-sodium or reduced-sodium. Regular tomato and vegetable juices that are not marked as low-sodium or reduced-sodium. Rosita Fire. Olives. Grains Baked goods made with fat, such as croissants, muffins, or some breads. Dry pasta or rice meal packs. Meats and  other proteins Fatty cuts of meat. Ribs. Fried meat. Tomasa Blase. Bologna, salami, and other precooked or cured meats, such as sausages or meat loaves, that are not lean and low in sodium. Fat from the back of a pig (fatback). Bratwurst. Salted nuts and seeds. Canned beans with added salt. Canned or smoked fish. Whole eggs or egg yolks. Chicken or [place] with skin. Dairy Whole or 2% milk, cream, and half-and-half. Whole or full-fat cream cheese. Whole-fat or sweetened yogurt. Full-fat cheese. Nondairy creamers. Whipped toppings. Processed cheese and cheese spreads. Fats and oils Butter. Stick margarine. Lard. Shortening. Ghee. Bacon fat. Tropical oils, such as coconut, palm kernel, or palm  oil. Seasonings and condiments Onion salt, garlic salt, seasoned salt, table salt, and sea salt. Worcestershire sauce. Tartar sauce. Barbecue sauce. Teriyaki sauce. Soy sauce, including reduced-sodium soy sauce. Steak sauce. Canned and packaged gravies. Fish sauce. Oyster sauce. Cocktail sauce. Store-bought horseradish. Ketchup. Mustard. Meat flavorings and tenderizers. Bouillon cubes. Hot sauces. Pre-made or packaged marinades. Pre-made or packaged taco seasonings. Relishes. Regular salad dressings. Other foods Salted popcorn and pretzels. The items listed above may not be all the foods and drinks you should avoid. Talk to a dietitian to learn more. Where to find more information National Heart, Lung, and Blood Institute (NHLBI): BuffaloDryCleaner.gl American Heart Association (AHA): heart.org Academy of Nutrition and Dietetics: eatright.org National Kidney Foundation (NKF): kidney.org This information is not intended to replace advice given to you by your health care provider. Make sure you discuss any questions you have with your health care provider. Document Revised: 12/24/2022 Document Reviewed: 12/24/2022 Elsevier Patient Education  2024 [employer].

## 2024-08-29 ENCOUNTER — Encounter: Payer: Self-pay | Admitting: Nurse Practitioner

## 2024-08-29 ENCOUNTER — Ambulatory Visit (INDEPENDENT_AMBULATORY_CARE_PROVIDER_SITE_OTHER): Admitting: Nurse Practitioner

## 2024-08-29 VITALS — BP 117/76 | HR 75 | Temp 98.1°F | Resp 16 | Ht 65.98 in | Wt 151.2 lb

## 2024-08-29 DIAGNOSIS — Z0000 Encounter for general adult medical examination without abnormal findings: Secondary | ICD-10-CM

## 2024-08-29 DIAGNOSIS — M85852 Other specified disorders of bone density and structure, left thigh: Secondary | ICD-10-CM | POA: Diagnosis not present

## 2024-08-29 DIAGNOSIS — Z13820 Encounter for screening for osteoporosis: Secondary | ICD-10-CM | POA: Diagnosis not present

## 2024-08-29 DIAGNOSIS — Z780 Asymptomatic menopausal state: Secondary | ICD-10-CM | POA: Diagnosis not present

## 2024-08-29 DIAGNOSIS — E782 Mixed hyperlipidemia: Secondary | ICD-10-CM | POA: Diagnosis not present

## 2024-08-29 DIAGNOSIS — F329 Major depressive disorder, single episode, unspecified: Secondary | ICD-10-CM

## 2024-08-29 DIAGNOSIS — R7989 Other specified abnormal findings of blood chemistry: Secondary | ICD-10-CM

## 2024-08-29 DIAGNOSIS — R7309 Other abnormal glucose: Secondary | ICD-10-CM

## 2024-08-29 DIAGNOSIS — D151 Benign neoplasm of heart: Secondary | ICD-10-CM | POA: Diagnosis not present

## 2024-08-29 DIAGNOSIS — I10 Essential (primary) hypertension: Secondary | ICD-10-CM

## 2024-08-29 DIAGNOSIS — E538 Deficiency of other specified B group vitamins: Secondary | ICD-10-CM | POA: Diagnosis not present

## 2024-08-29 DIAGNOSIS — Z1231 Encounter for screening mammogram for malignant neoplasm of breast: Secondary | ICD-10-CM | POA: Diagnosis not present

## 2024-08-29 DIAGNOSIS — E6609 Other obesity due to excess calories: Secondary | ICD-10-CM

## 2024-08-29 LAB — MICROALBUMIN, URINE WAIVED
Creatinine, Urine Waived: 300 mg/dL (ref 10–300)
Microalb, Ur Waived: 150 mg/L — ABNORMAL HIGH (ref 0–19)

## 2024-08-29 LAB — BAYER DCA HB A1C WAIVED: HB A1C (BAYER DCA - WAIVED): 5.4 % (ref 4.8–5.6)

## 2024-08-29 MED ORDER — PAROXETINE HCL 40 MG PO TABS: 20.0000 mg | ORAL_TABLET | Freq: Every day | ORAL | 4 refills | Status: DC

## 2024-08-29 NOTE — Assessment & Plan Note (Addendum)
 History of in 2007, past echo in hospital showing no return of this.  Continue collaboration with cardiology.

## 2024-08-29 NOTE — Progress Notes (Signed)
 BP 117/76 (BP Location: Left Arm, Patient Position: Sitting, Cuff Size: Normal)   Pulse 75   Temp 98.1 F (36.7 C) (Oral)   Resp 16   Ht 5' 5.98 (1.676 m)   Wt 151 lb 3.2 oz (68.6 kg)   LMP  (LMP Unknown)   SpO2 100%   BMI 24.42 kg/m    Subjective:    Patient ID: Lisa Bass, female    DOB: 01/26/1951, 73 y.o.   MRN: [id number]  HPI: Lisa Bass is a 73 y.o. female presenting on 08/29/2024 for comprehensive medical examination and medication refills. Current medical complaints include:none  She currently lives with: husband Menopausal Symptoms: no  HYPERTENSION / HYPERLIPIDEMIA Taking Losartan , Amlodipine  (higher doses of this caused ankle swelling), and HCTZ 12.5 MG (started 08/25/24).  Followed with cardiology in past, last July 2021 due to myxoma of heart = s/p excision in 2007.  Taking Rosuvastatin  3 days a week. Satisfied with current treatment? yes Duration of hypertension: chronic BP monitoring frequency: twice a day BP range: 110/73 to 144/82 BP medication side effects: no Duration of hyperlipidemia: chronic Cholesterol medication side effects: no Cholesterol supplements: none Medication compliance: good compliance Aspirin : yes Recent stressors: no Recurrent headaches: no Visual changes: no Palpitations: no Dyspnea: no Chest pain: no Lower extremity edema: no Dizzy/lightheaded: no  The 10-year ASCVD risk score (Arnett DK, et al., 2019) is: 16.1%   Values used to calculate the score:     Age: 35 years     Clincally relevant sex: Female     Is Non-Hispanic African American: No     Diabetic: No     Tobacco smoker: No     Systolic Blood Pressure: 117 mmHg     Is BP treated: Yes     HDL Cholesterol: 54 mg/dL     Total Cholesterol: 194 mg/dL  ELEVATED TSH Past 3 years levels have been stable. Fatigue: no Cold intolerance: no Heat intolerance: no Weight gain: no Weight loss: no Constipation: no Diarrhea/loose stools: no Palpitations: no Lower  extremity edema: no Anxiety/depressed mood: no   OSTEOPENIA DEXA on 05/30/2019 = The BMD measured at Femur Neck Left is 0.809 g/cm2 with a T-score of -1.6. No recent falls or fractures. Low B12 in the past, takes supplement. Satisfied with current treatment?: yes Past osteoporosis medications/treatments: supplements Adequate calcium  & vitamin D : not regularly Weight bearing exercises: yes   Impaired Fasting Glucose HbA1C:  Lab Results  Component Value Date   HGBA1C 5.3 02/22/2024  Duration of elevated blood sugar: chronic Polydipsia: no Polyuria: no Weight change: no Visual disturbance: no Glucose Monitoring: no    Accucheck frequency: Not Checking    Fasting glucose:     Post prandial:  Diabetic Education: Not Completed Family history of diabetes: yes   DEPRESSION Taking Paxil  10 MG daily.  Only stressor is having to return to dentist. Mood status: stable Satisfied with current treatment?: yes Symptom severity: mild Duration of current treatment : chronic Side effects: no Medication compliance: good compliance Psychotherapy/counseling: none Depressed mood: no Anxious mood: as above Anhedonia: no Significant weight loss or gain: no Insomnia: none Fatigue: no Feelings of worthlessness or guilt: no Impaired concentration/indecisiveness: no Suicidal ideations: no Hopelessness: no Crying spells: no    08/29/2024    1:59 PM 07/20/2024    2:53 PM 06/06/2024    4:10 PM 02/22/2024    9:14 AM 10/29/2023   11:50 AM  Depression screen PHQ 2/9  Decreased Interest 0 0 0  0 1  Down, Depressed, Hopeless 0 0 0 1 1  PHQ - 2 Score 0 0 0 1 2  Altered sleeping 0 0 0 0 0  Tired, decreased energy 0 0 1 0 0  Change in appetite 0 0 0 0 1  Feeling bad or failure about yourself  0 0 0 0 0  Trouble concentrating 0 0 1 0 0  Moving slowly or fidgety/restless 0 0 0 0 0  Suicidal thoughts 0 0 0 0 0  PHQ-9 Score 0 0 2 1 3   Difficult doing work/chores  Not difficult at all Not difficult at all  Not difficult at all Somewhat difficult      08/29/2024    1:59 PM 06/06/2024    4:11 PM 02/22/2024    9:14 AM 10/29/2023   11:50 AM  GAD 7 : Generalized Anxiety Score  Nervous, Anxious, on Edge 0 0 1 0  Control/stop worrying 1 1 0 0  Worry too much - different things 1 0 0 0  Trouble relaxing 0 0 0   Restless 0 0 0 0  Easily annoyed or irritable 0 0 1 0  Afraid - awful might happen 0 0 0 0  Total GAD 7 Score 2 1 2    Anxiety Difficulty  Not difficult at all Not difficult at all Not difficult at all      10/08/2023    3:30 PM 02/22/2024    9:14 AM 07/16/2024    3:22 PM 07/20/2024    2:57 PM 08/29/2024    1:59 PM  Fall Risk  Falls in the past year? 0 0 0 0 0  Was there an injury with Fall? 0 0 0 0 0  Fall Risk Category Calculator 0 0 0  0 0  Patient at Risk for Falls Due to No Fall Risks No Fall Risks  No Fall Risks No Fall Risks  Fall risk Follow up Falls prevention discussed;Education provided;Falls evaluation completed Falls evaluation completed  Falls evaluation completed Falls evaluation completed     Patient-reported    Functional Status Survey: Is the patient deaf or have difficulty hearing?: No Does the patient have difficulty seeing, even when wearing glasses/contacts?: No Does the patient have difficulty concentrating, remembering, or making decisions?: No Does the patient have difficulty walking or climbing stairs?: No Does the patient have difficulty dressing or bathing?: No Does the patient have difficulty doing errands alone such as visiting a doctor's office or shopping?: No   Past Medical History:  Past Medical History:  Diagnosis Date   Allergy    Anxiety    medication   Asthma    Cataract    CHF (congestive heart failure) (HCC)    Depression    Diabetes mellitus without complication (HCC)    pre diabetes   Hyperlipidemia    Hypertension    OCD (obsessive compulsive disorder)    Palpitations    Perimenopause    Surgical History:  Past Surgical History:   Procedure Laterality Date   CATARACT EXTRACTION, BILATERAL  02/2021   CHOLECYSTECTOMY     COLONOSCOPY WITH PROPOFOL  N/A 09/07/2017   Procedure: COLONOSCOPY WITH PROPOFOL ;  Surgeon: Jinny Carmine, MD;  Location: [facility] ENDOSCOPY;  Service: Endoscopy;  Laterality: N/A;   EYE SURGERY  02/20/2021   surgery cateracts   open heart surgery     ROOT CANAL     x2   Medications:  Current Outpatient Medications on File Prior to Visit  Medication Sig   amLODipine  (  NORVASC ) 5 MG tablet Take 1 tablet (5 mg total) by mouth daily.   aspirin  81 MG tablet Take 81 mg by mouth daily.   Cholecalciferol  1.25 MG (50000 UT) TABS Take 1 tablet by mouth once a week.   cyanocobalamin  (VITAMIN B12) 1000 MCG tablet Take 1 tablet (1,000 mcg total) by mouth daily.   ezetimibe  (ZETIA ) 10 MG tablet Take 1 tablet (10 mg total) by mouth daily.   hydrochlorothiazide  (HYDRODIURIL ) 25 MG tablet Take 0.5 tablets (12.5 mg total) by mouth daily.   losartan  (COZAAR ) 100 MG tablet Take 1 tablet (100 mg total) by mouth daily.   Multiple Vitamin (MULTIVITAMIN) tablet Take 1 tablet by mouth daily.   nystatin  cream (MYCOSTATIN ) Apply 1 Application topically 2 (two) times daily. (Patient taking differently: Apply 1 Application topically 2 (two) times daily. PRN)   rosuvastatin  (CRESTOR ) 10 MG tablet Take 1 tablet (10 MG) by mouth on Monday, Wednesday, and Friday.   triamcinolone  cream (KENALOG ) 0.1 % Apply 1 Application topically 2 (two) times daily. (Patient taking differently: Apply 1 Application topically 2 (two) times daily. PRN)   No current facility-administered medications on file prior to visit.    Allergies:  Allergies  Allergen Reactions   Penicillins Anaphylaxis   Biaxin [Clarithromycin] Other (See Comments)    GI issues   Lisinopril Diarrhea   Pork-Derived Products     Alpha Gal for Pork and Lamb (Beef ok)    Social History:  Social History   Socioeconomic History   Marital status: Married    Spouse name:  Toby   Number of children: 1   Years of education: Not on file   Highest education level: 12th grade  Occupational History   Occupation: retired   Tobacco Use   Smoking status: Never   Smokeless tobacco: Never  Vaping Use   Vaping status: Never Used  Substance and Sexual Activity   Alcohol use: No   Drug use: No   Sexual activity: Not Currently  Other Topics Concern   Not on file  Social History Narrative   Very active in church    Social Drivers of Health   Financial Resource Strain: Low Risk  (07/20/2024)   Overall Financial Resource Strain (CARDIA)    Difficulty of Paying Living Expenses: Not hard at all  Food Insecurity: No Food Insecurity (07/20/2024)   Hunger Vital Sign    Worried About Running Out of Food in the Last Year: Never true    Ran Out of Food in the Last Year: Never true  Transportation Needs: No Transportation Needs (07/20/2024)   PRAPARE - Administrator, Civil Service (Medical): No    Lack of Transportation (Non-Medical): No  Physical Activity: Inactive (07/20/2024)   Exercise Vital Sign    Days of Exercise per Week: 0 days    Minutes of Exercise per Session: 0 min  Stress: No Stress Concern Present (07/20/2024)   [employer] of Occupational Health - Occupational Stress Questionnaire    Feeling of Stress: Only a little  Social Connections: Socially Integrated (07/20/2024)   Social Connection and Isolation Panel    Frequency of Communication with Friends and Family: More than three times a week    Frequency of Social Gatherings with Friends and Family: More than three times a week    Attends Religious Services: More than 4 times per year    Active Member of [employer] or Organizations: Yes    Attends Banker Meetings: More than 4 times  per year    Marital Status: Married  Catering manager Violence: Not At Risk (07/20/2024)   Humiliation, Afraid, Rape, and Kick questionnaire    Fear of Current or Ex-Partner: No    Emotionally  Abused: No    Physically Abused: No    Sexually Abused: No   Social History   Tobacco Use  Smoking Status Never  Smokeless Tobacco Never   Social History   Substance and Sexual Activity  Alcohol Use No    Family History:  Family History  Problem Relation Age of Onset   Hypertension Mother    Dementia Father    Hypertension Father    Diabetes Father    Depression Father    GI problems Daughter    Heart disease Maternal Grandfather    Depression Sister     Past medical history, surgical history, medications, allergies, family history and social history reviewed with patient today and changes made to appropriate areas of the chart.   ROS All other ROS negative except what is listed above and in the HPI.      Objective:    BP 117/76 (BP Location: Left Arm, Patient Position: Sitting, Cuff Size: Normal)   Pulse 75   Temp 98.1 F (36.7 C) (Oral)   Resp 16   Ht 5' 5.98 (1.676 m)   Wt 151 lb 3.2 oz (68.6 kg)   LMP  (LMP Unknown)   SpO2 100%   BMI 24.42 kg/m   Wt Readings from Last 3 Encounters:  08/29/24 151 lb 3.2 oz (68.6 kg)  08/02/24 160 lb 6.4 oz (72.8 kg)  07/20/24 163 lb (73.9 kg)    Physical Exam Vitals and nursing note reviewed. Exam conducted with a chaperone present.  Constitutional:      General: She is awake. She is not in acute distress.    Appearance: She is well-developed and well-groomed. She is not ill-appearing or toxic-appearing.  HENT:     Head: Normocephalic and atraumatic.     Right Ear: Hearing, tympanic membrane, ear canal and external ear normal. No drainage.     Left Ear: Hearing, tympanic membrane, ear canal and external ear normal. No drainage.     Nose: Nose normal.     Right Sinus: No maxillary sinus tenderness or frontal sinus tenderness.     Left Sinus: No maxillary sinus tenderness or frontal sinus tenderness.     Mouth/Throat:     Mouth: Mucous membranes are moist.     Pharynx: Oropharynx is clear. Uvula midline. No  pharyngeal swelling, oropharyngeal exudate or posterior oropharyngeal erythema.  Eyes:     General: Lids are normal.        Right eye: No discharge.        Left eye: No discharge.     Extraocular Movements: Extraocular movements intact.     Conjunctiva/sclera: Conjunctivae normal.     Pupils: Pupils are equal, round, and reactive to light.     Visual Fields: Right eye visual fields normal and left eye visual fields normal.  Neck:     Thyroid : No thyromegaly.     Vascular: No carotid bruit.     Trachea: Trachea normal.  Cardiovascular:     Rate and Rhythm: Normal rate and regular rhythm.     Heart sounds: Normal heart sounds. No murmur heard.    No gallop.  Pulmonary:     Effort: Pulmonary effort is normal. No accessory muscle usage or respiratory distress.     Breath sounds: Normal  breath sounds.  Chest:  Breasts:    Right: Normal.     Left: Normal.  Abdominal:     General: Bowel sounds are normal.     Palpations: Abdomen is soft. There is no hepatomegaly or splenomegaly.     Tenderness: There is no abdominal tenderness.  Musculoskeletal:        General: Normal range of motion.     Cervical back: Normal range of motion and neck supple.     Right lower leg: No edema.     Left lower leg: No edema.  Lymphadenopathy:     Head:     Right side of head: No submental, submandibular, tonsillar, preauricular or posterior auricular adenopathy.     Left side of head: No submental, submandibular, tonsillar, preauricular or posterior auricular adenopathy.     Cervical: No cervical adenopathy.     Upper Body:     Right upper body: No supraclavicular, axillary or pectoral adenopathy.     Left upper body: No supraclavicular, axillary or pectoral adenopathy.  Skin:    General: Skin is warm and dry.     Capillary Refill: Capillary refill takes less than 2 seconds.     Findings: No rash.  Neurological:     Mental Status: She is alert and oriented to person, place, and time.     Gait: Gait  is intact.     Deep Tendon Reflexes: Reflexes are normal and symmetric.     Reflex Scores:      Brachioradialis reflexes are 2+ on the right side and 2+ on the left side.      Patellar reflexes are 2+ on the right side and 2+ on the left side. Psychiatric:        Attention and Perception: Attention normal.        Mood and Affect: Mood normal.        Speech: Speech normal.        Behavior: Behavior normal. Behavior is cooperative.        Thought Content: Thought content normal.        Judgment: Judgment normal.       07/20/2024    2:57 PM 07/20/2023    4:00 PM 07/13/2022   11:04 AM 07/07/2021    1:05 PM 06/21/2020   10:00 AM  6CIT Screen  What Year? 0 points 0 points 0 points 0 points 0 points  What month? 0 points 0 points 0 points 0 points 0 points  What time? 0 points 0 points 0 points 0 points 0 points  Count back from 20 0 points 0 points 0 points 0 points 0 points  Months in reverse 0 points 0 points 0 points 0 points 0 points  Repeat phrase 0 points 2 points 0 points 2 points 0 points  Total Score 0 points 2 points 0 points 2 points 0 points   Results for orders placed or performed in visit on 07/11/24  Alpha-Gal Panel   Collection Time: 07/11/24  1:21 PM  Result Value Ref Range   Class Description Allergens Comment    IgE (Immunoglobulin E), Serum 646 (H) 6 - 495 IU/mL   Pork IgE 0.38 (A) Class I kU/L   Beef IgE <0.10 Class 0 kU/L   Allergen Lamb IgE 0.54 (A) Class I kU/L   [id number] Alpha-Gal <0.10 Class 0 kU/L      Assessment & Plan:   Problem List Items Addressed This Visit       Cardiovascular and Mediastinum  Myxoma of heart   History of in 2007, past echo in hospital showing no return of this.  Continue collaboration with cardiology.      Relevant Orders   Comprehensive metabolic panel with GFR   Lipid Panel w/o Chol/HDL Ratio   Hypertension   Chronic, ongoing.  BP improving at home and in office.  Will continue Losartan  100 MG daily, HCTZ 12.5 MG, and  Amlodipine  5 MG daily for now. But is BP has consistent lows she is aware to stop HCTZ and alert PCP.  Reiterated importance of taking these daily to prevent BP elevations which could lead to acute stroke.  Recommend she continue to monitor BP daily at home and notify provider if consistent above 130/80.  Labs: CBC, CMP, TSH.  Recommend focus on DASH diet and modest weight loss.  Continue collaboration with cardiology, appreciate their input.  Urine ALB 28 February 2024.  Could consider changing Losartan  to Valsartan in future if elevations.       Relevant Orders   Microalbumin, Urine Waived   Comprehensive metabolic panel with GFR   CBC with Differential/Platelet     Musculoskeletal and Integument   Osteopenia of neck of left femur   Ongoing.  Noted on DEXA June 2020.  Recommend she continue daily Vitamin D3 supplement and adequate calcium  intake at home.  Check Vit D level today.  Repeat DEXA June 2025, is ordered and aware she needs to schedule.      Relevant Orders   VITAMIN D  25 Hydroxy (Vit-D Deficiency, Fractures)     Other   Major depression, chronic - Primary   Chronic, stable.  Denies SI/HI.  Continue current medication regimen and adjust as needed.  Due to age would benefit from change to Zoloft or alternate SSRI in future and discontinuation Paxil , she is aware of this and wishes to maintain regimen at this time.        Relevant Medications   PARoxetine  (PAXIL ) 40 MG tablet   Hyperlipidemia   Chronic, ongoing.  Recommend she continue Crestor  10 MG Monday/Wednesday/Friday schedule for prevention.  Obtain lipid panel today.        Relevant Orders   Comprehensive metabolic panel with GFR   Lipid Panel w/o Chol/HDL Ratio   Elevated TSH   Improved recent labs, will recheck TSH and Free T4 today -- asymptomatic at this time.      Relevant Orders   TSH   T4, free   Elevated hemoglobin A1c   A1c 5.3% last check.  Highly recommend continued focus on diet changes and regular  activity.  If trend up to 6.5% or greater consider starting medication.      Relevant Orders   Bayer DCA Hb A1c Waived   Microalbumin, Urine Waived   B12 deficiency   Ongoing. Recommend she continue B12 supplement.  Check level today.      Relevant Orders   CBC with Differential/Platelet   Vitamin B12   Other Visit Diagnoses       Encounter for screening mammogram for malignant neoplasm of breast       Mammogram ordered and instructed how to schedule.     Encounter for osteoporosis screening in asymptomatic postmenopausal patient       DEXA ordered and instructed how to schedule.     Encounter for annual physical exam       Annual physical today with labs and health maintenance reviewed, discussed with patient.        Follow up plan:  Return in about 3 months (around 11/28/2024) for HTN.   LABORATORY TESTING:  - Pap smear: not applicable  IMMUNIZATIONS:   - Tdap: Tetanus vaccination status reviewed: Refused. - Influenza: Refuses - Pneumovax: Up to date - Prevnar: Up to date - COVID: Refused - HPV: Not applicable - Shingrix vaccine: Refused  SCREENING: -Mammogram: Ordered today  - Colonoscopy: Up to date  - Bone Density: Ordered today -Hearing Test: Not applicable  -Spirometry: Not applicable   PATIENT COUNSELING:   Advised to take 1 mg of folate supplement per day if capable of pregnancy.   Sexuality: Discussed sexually transmitted diseases, partner selection, use of condoms, avoidance of unintended pregnancy  and contraceptive alternatives.   Advised to avoid cigarette smoking.  I discussed with the patient that most people either abstain from alcohol or drink within safe limits (<=14/week and <=4 drinks/occasion for males, <=7/weeks and <= 3 drinks/occasion for females) and that the risk for alcohol disorders and other health effects rises proportionally with the number of drinks per week and how often a drinker exceeds daily limits.  Discussed  cessation/primary prevention of drug use and availability of treatment for abuse.   Diet: Encouraged to adjust caloric intake to maintain  or achieve ideal body weight, to reduce intake of dietary saturated fat and total fat, to limit sodium intake by avoiding high sodium foods and not adding table salt, and to maintain adequate dietary potassium and calcium  preferably from fresh fruits, vegetables, and low-fat dairy products.    Stressed the importance of regular exercise  Injury prevention: Discussed safety belts, safety helmets, smoke detector, smoking near bedding or upholstery.   Dental health: Discussed importance of regular tooth brushing, flossing, and dental visits.    NEXT PREVENTATIVE PHYSICAL DUE IN 1 YEAR. Return in about 3 months (around 11/28/2024) for HTN.

## 2024-08-29 NOTE — Assessment & Plan Note (Signed)
Chronic, stable.  Denies SI/HI.  Continue current medication regimen and adjust as needed.  Due to age would benefit from change to Zoloft or alternate SSRI in future and discontinuation Paxil, she is aware of this and wishes to maintain regimen at this time.

## 2024-08-29 NOTE — Assessment & Plan Note (Signed)
Improved recent labs, will recheck TSH and Free T4 today -- asymptomatic at this time.

## 2024-08-29 NOTE — Assessment & Plan Note (Signed)
 Chronic, ongoing.  Recommend she continue Crestor 10 MG Monday/Wednesday/Friday schedule for prevention.  Obtain lipid panel today.

## 2024-08-29 NOTE — Assessment & Plan Note (Signed)
 Chronic, ongoing.  BP improving at home and in office.  Will continue Losartan  100 MG daily, HCTZ 12.5 MG, and Amlodipine  5 MG daily for now. But is BP has consistent lows she is aware to stop HCTZ and alert PCP.  Reiterated importance of taking these daily to prevent BP elevations which could lead to acute stroke.  Recommend she continue to monitor BP daily at home and notify provider if consistent above 130/80.  Labs: CBC, CMP, TSH.  Recommend focus on DASH diet and modest weight loss.  Continue collaboration with cardiology, appreciate their input.  Urine ALB 28 February 2024.  Could consider changing Losartan  to Valsartan in future if elevations.

## 2024-08-29 NOTE — Assessment & Plan Note (Signed)
 A1c 5.3% last check.  Highly recommend continued focus on diet changes and regular activity.  If trend up to 6.5% or greater consider starting medication.

## 2024-08-29 NOTE — Assessment & Plan Note (Signed)
 Ongoing.  Noted on DEXA June 2020.  Recommend she continue daily Vitamin D3 supplement and adequate calcium  intake at home.  Check Vit D level today.  Repeat DEXA June 2025, is ordered and aware she needs to schedule.

## 2024-08-29 NOTE — Assessment & Plan Note (Signed)
 Ongoing. Recommend she continue B12 supplement.  Check level today.

## 2024-08-30 ENCOUNTER — Ambulatory Visit: Payer: Self-pay | Admitting: Nurse Practitioner

## 2024-08-30 DIAGNOSIS — N1831 Chronic kidney disease, stage 3a: Secondary | ICD-10-CM

## 2024-08-30 LAB — COMPREHENSIVE METABOLIC PANEL WITH GFR
ALT: 15 IU/L (ref 0–32)
AST: 25 IU/L (ref 0–40)
Albumin: 4.4 g/dL (ref 3.8–4.8)
Alkaline Phosphatase: 124 IU/L — ABNORMAL HIGH (ref 44–121)
BUN/Creatinine Ratio: 12 (ref 12–28)
BUN: 16 mg/dL (ref 8–27)
Bilirubin Total: 0.7 mg/dL (ref 0.0–1.2)
CO2: 24 mmol/L (ref 20–29)
Calcium: 10.2 mg/dL (ref 8.7–10.3)
Chloride: 99 mmol/L (ref 96–106)
Creatinine, Ser: 1.39 mg/dL — ABNORMAL HIGH (ref 0.57–1.00)
Globulin, Total: 3 g/dL (ref 1.5–4.5)
Glucose: 90 mg/dL (ref 70–99)
Potassium: 4.2 mmol/L (ref 3.5–5.2)
Sodium: 140 mmol/L (ref 134–144)
Total Protein: 7.4 g/dL (ref 6.0–8.5)
eGFR: 40 mL/min/1.73 — ABNORMAL LOW (ref >59)

## 2024-08-30 LAB — CBC WITH DIFFERENTIAL/PLATELET
Basophils Absolute: 0.1 x10E3/uL (ref 0.0–0.2)
Basos: 1 %
EOS (ABSOLUTE): 0.2 x10E3/uL (ref 0.0–0.4)
Eos: 3 %
Hematocrit: 40 % (ref 34.0–46.6)
Hemoglobin: 12.9 g/dL (ref 11.1–15.9)
Immature Grans (Abs): 0 x10E3/uL (ref 0.0–0.1)
Immature Granulocytes: 0 %
Lymphocytes Absolute: 2.2 x10E3/uL (ref 0.7–3.1)
Lymphs: 27 %
MCH: 28.5 pg (ref 26.6–33.0)
MCHC: 32.3 g/dL (ref 31.5–35.7)
MCV: 89 fL (ref 79–97)
Monocytes Absolute: 0.6 x10E3/uL (ref 0.1–0.9)
Monocytes: 7 %
Neutrophils Absolute: 5.1 x10E3/uL (ref 1.4–7.0)
Neutrophils: 62 %
Platelets: 256 x10E3/uL (ref 150–450)
RBC: 4.52 x10E6/uL (ref 3.77–5.28)
RDW: 12.7 % (ref 11.7–15.4)
WBC: 8.1 x10E3/uL (ref 3.4–10.8)

## 2024-08-30 LAB — VITAMIN B12: Vitamin B-12: 311 pg/mL (ref 232–1245)

## 2024-08-30 LAB — VITAMIN D 25 HYDROXY (VIT D DEFICIENCY, FRACTURES): Vit D, 25-Hydroxy: 17.4 ng/mL — ABNORMAL LOW (ref 30.0–100.0)

## 2024-08-30 LAB — TSH: TSH: 2.5 u[IU]/mL (ref 0.450–4.500)

## 2024-08-30 LAB — LIPID PANEL W/O CHOL/HDL RATIO
Cholesterol, Total: 210 mg/dL — ABNORMAL HIGH (ref 100–199)
HDL: 55 mg/dL (ref >39)
LDL Chol Calc (NIH): 135 mg/dL — ABNORMAL HIGH (ref 0–99)
Triglycerides: 112 mg/dL (ref 0–149)
VLDL Cholesterol Cal: 20 mg/dL (ref 5–40)

## 2024-08-30 LAB — T4, FREE: Free T4: 1.29 ng/dL (ref 0.82–1.77)

## 2024-08-30 NOTE — Progress Notes (Signed)
 Contacted via MyChart -- needs lab only visit in 2 weeks please  Good morning Lisa Bass, your labs have returned: - Kidney function, creatinine and eGFR, is showing a little downward trend this check. I would like you to increase water intake and avoid any Ibuprofen products over the next 2 weeks.  I then want to recheck outpatient. - Lipid panel continues to show elevations above goal, I know statin therapy daily has not been tolerated. We may need to consider stopping Rosuvastatin  and starting an injection like Repatha or Praluent every 2 weeks which are often tolerated better and can help lower levels better. - Vitamin D  level remains low, ensure you are taking Vitamin D3 2000 units daily.  B12 also remains on lower end of normal.  Ensure you are taking B12 1000 MCG daily. - Remainder of labs stable.  Any questions? Keep being stellar!!  Thank you for allowing me to participate in your care.  I appreciate you. Kindest regards, Willamae Demby

## 2024-08-31 NOTE — Progress Notes (Signed)
 Scheduled

## 2024-09-11 ENCOUNTER — Encounter: Payer: Self-pay | Admitting: Nurse Practitioner

## 2024-09-14 ENCOUNTER — Other Ambulatory Visit

## 2024-09-14 DIAGNOSIS — N1831 Chronic kidney disease, stage 3a: Secondary | ICD-10-CM

## 2024-09-14 DIAGNOSIS — M85852 Other specified disorders of bone density and structure, left thigh: Secondary | ICD-10-CM

## 2024-09-15 ENCOUNTER — Ambulatory Visit: Payer: Self-pay | Admitting: Nurse Practitioner

## 2024-09-15 LAB — BASIC METABOLIC PANEL WITH GFR
BUN/Creatinine Ratio: 12 (ref 12–28)
BUN: 14 mg/dL (ref 8–27)
CO2: 24 mmol/L (ref 20–29)
Calcium: 10.1 mg/dL (ref 8.7–10.3)
Chloride: 103 mmol/L (ref 96–106)
Creatinine, Ser: 1.19 mg/dL — ABNORMAL HIGH (ref 0.57–1.00)
Glucose: 94 mg/dL (ref 70–99)
Potassium: 4.1 mmol/L (ref 3.5–5.2)
Sodium: 141 mmol/L (ref 134–144)
eGFR: 48 mL/min/1.73 — ABNORMAL LOW (ref >59)

## 2024-09-15 NOTE — Progress Notes (Signed)
 Contacted via MyChart  Good morning Lisa Bass, kidney function levels are slowly improving.  Continue current medication regimen and we we will recheck next visit.  Ensure you are getting plenty of water in.  Any questions? Keep being amazing!!  Thank you for allowing me to participate in your care.  I appreciate you. Kindest regards, Kerri-Anne Haeberle

## 2024-09-21 ENCOUNTER — Encounter: Payer: Self-pay | Admitting: Nurse Practitioner

## 2024-11-06 ENCOUNTER — Encounter: Payer: Self-pay | Admitting: Nurse Practitioner

## 2024-11-10 ENCOUNTER — Other Ambulatory Visit

## 2024-11-10 ENCOUNTER — Telehealth: Payer: Self-pay | Admitting: Nurse Practitioner

## 2024-11-10 DIAGNOSIS — Z91018 Allergy to other foods: Secondary | ICD-10-CM

## 2024-11-10 NOTE — Telephone Encounter (Signed)
 Called and notified patient that lab order and referral have been placed. Patient states she will stop by for lab, doesn't know when she can come by.

## 2024-11-10 NOTE — Telephone Encounter (Signed)
 Copied from CRM [id number]. Topic: Clinical - Request for Lab/Test Order >> Nov 10, 2024  9:56 AM Winona R wrote: Pt would like to have another alpha gal test ran so she can take those results to a specialist. Pt would also like a referral to the specialist   Scott P. Commins, MD, PhD- Associate Chief for Allergy & Immunology  [address], [address] [place], [place] [address] Appointments: [phone] Office: [phone] Fax: [phone]

## 2024-11-10 NOTE — Telephone Encounter (Signed)
 Routing to provider. Can labs be ordered and referral placed for the patient?

## 2024-11-13 ENCOUNTER — Ambulatory Visit: Payer: Self-pay | Admitting: Nurse Practitioner

## 2024-11-13 LAB — ALPHA-GAL PANEL
Allergen Lamb IgE: 0.43 kU/L — AB
Beef IgE: <0.1 kU/L
IgE (Immunoglobulin E), Serum: 378 [IU]/mL (ref 6–495)
O215-IgE Alpha-Gal: <0.1 kU/L
Pork IgE: 0.32 kU/L — AB

## 2024-11-13 NOTE — Progress Notes (Signed)
 Contacted via MyChart  Levels trending down this check.

## 2024-11-18 NOTE — Patient Instructions (Addendum)
 - Check blood pressure only once a day 2 hours after taking blood pressure medications. - Take Losartan  in the morning and Amlodipine  at night - Okay to take Rosuvastatin  and Zetia , to help prevent stroke and prevent plaque build up in vessels  Be Involved in Caring For Your Health:  Taking Medications When medications are taken as directed, they can greatly improve your health. But if they are not taken as prescribed, they may not work. In some cases, not taking them correctly can be harmful. To help ensure your treatment remains effective and safe, understand your medications and how to take them. Bring your medications to each visit for review by your provider.  Your lab results, notes, and after visit summary will be available on My Chart. We strongly encourage you to use this feature. If lab results are abnormal the clinic will contact you with the appropriate steps. If the clinic does not contact you assume the results are satisfactory. You can always view your results on My Chart. If you have questions regarding your health or results, please contact the clinic during office hours. You can also ask questions on My Chart.  We at [facility] are grateful that you chose us  to provide your care. We strive to provide evidence-based and compassionate care and are always looking for feedback. If you get a survey from the clinic please complete this so we can hear your opinions.   DASH Eating Plan DASH stands for Dietary Approaches to Stop Hypertension. The DASH eating plan is a healthy eating plan that has been shown to: Lower high blood pressure (hypertension). Reduce your risk for type 2 diabetes, heart disease, and stroke. Help with weight loss. What are tips for following this plan? Reading food labels Check food labels for the amount of salt (sodium) per serving. Choose foods with less than 5 percent of the Daily Value (DV) of sodium. In general, foods with less than 300  milligrams (mg) of sodium per serving fit into this eating plan. To find whole grains, look for the word whole as the first word in the ingredient list. Shopping Buy products labeled as low-sodium or no salt added. Buy fresh foods. Avoid canned foods and pre-made or frozen meals. Cooking Try not to add salt when you cook. Use salt-free seasonings or herbs instead of table salt or sea salt. Check with your health care provider or pharmacist before using salt substitutes. Do not fry foods. Cook foods in healthy ways, such as baking, boiling, grilling, roasting, or broiling. Cook using oils that are good for your heart. These include olive, canola, avocado, soybean, and sunflower oil. Meal planning  Eat a balanced diet. This should include: 4 or more servings of fruits and 4 or more servings of vegetables each day. Try to fill half of your plate with fruits and vegetables. 6-8 servings of whole grains each day. 6 or less servings of lean meat, poultry, or fish each day. 1 oz is 1 serving. A 3 oz (85 g) serving of meat is about the same size as the palm of your hand. One egg is 1 oz (28 g). 2-3 servings of low-fat dairy each day. One serving is 1 cup (237 mL). 1 serving of nuts, seeds, or beans 5 times each week. 2-3 servings of heart-healthy fats. Healthy fats called omega-3 fatty acids are found in foods such as walnuts, flaxseeds, fortified milks, and eggs. These fats are also found in cold-water fish, such as sardines, salmon, and mackerel.  Limit how much you eat of: Canned or prepackaged foods. Food that is high in trans fat, such as fried foods. Food that is high in saturated fat, such as fatty meat. Desserts and other sweets, sugary drinks, and other foods with added sugar. Full-fat dairy products. Do not salt foods before eating. Do not eat more than 4 egg yolks a week. Try to eat at least 2 vegetarian meals a week. Eat more home-cooked food and less restaurant, buffet, and fast  food. Lifestyle When eating at a restaurant, ask if your food can be made with less salt or no salt. If you drink alcohol: Limit how much you have to: 0-1 drink a day if you are female. 0-2 drinks a day if you are female. Know how much alcohol is in your drink. In the [place] one drink is one 12 oz bottle of beer (355 mL), one 5 oz glass of wine (148 mL), or one 1 oz glass of hard liquor (44 mL). General information Avoid eating more than 2,300 mg of salt a day. If you have hypertension, you may need to reduce your sodium intake to 1,500 mg a day. Work with your provider to stay at a healthy body weight or lose weight. Ask what the best weight range is for you. On most days of the week, get at least 30 minutes of exercise that causes your heart to beat faster. This may include walking, swimming, or biking. Work with your provider or dietitian to adjust your eating plan to meet your specific calorie needs. What foods should I eat? Fruits All fresh, dried, or frozen fruit. Canned fruits that are in their natural juice and do not have sugar added to them. Vegetables Fresh or frozen vegetables that are raw, steamed, roasted, or grilled. Low-sodium or reduced-sodium tomato and vegetable juice. Low-sodium or reduced-sodium tomato sauce and tomato paste. Low-sodium or reduced-sodium canned vegetables. Grains Whole-grain or whole-wheat bread. Whole-grain or whole-wheat pasta. Brown rice. Mcneil Madeira. Bulgur. Whole-grain and low-sodium cereals. Pita bread. Low-fat, low-sodium crackers. Whole-wheat flour tortillas. Meats and other proteins Skinless chicken or turkey. Ground chicken or turkey. Pork with fat trimmed off. Fish and seafood. Egg whites. Dried beans, peas, or lentils. Unsalted nuts, nut butters, and seeds. Unsalted canned beans. Lean cuts of beef with fat trimmed off. Low-sodium, lean precooked or cured meat, such as sausages or meat loaves. Dairy Low-fat (1%) or fat-free (skim) milk.  Reduced-fat, low-fat, or fat-free cheeses. Nonfat, low-sodium ricotta or cottage cheese. Low-fat or nonfat yogurt. Low-fat, low-sodium cheese. Fats and oils Soft margarine without trans fats. Vegetable oil. Reduced-fat, low-fat, or light mayonnaise and salad dressings (reduced-sodium). Canola, safflower, olive, avocado, soybean, and sunflower oils. Avocado. Seasonings and condiments Herbs. Spices. Seasoning mixes without salt. Other foods Unsalted popcorn and pretzels. Fat-free sweets. The items listed above may not be all the foods and drinks you can have. Talk to a dietitian to learn more. What foods should I avoid? Fruits Canned fruit in a light or heavy syrup. Fried fruit. Fruit in cream or butter sauce. Vegetables Creamed or fried vegetables. Vegetables in a cheese sauce. Regular canned vegetables that are not marked as low-sodium or reduced-sodium. Regular canned tomato sauce and paste that are not marked as low-sodium or reduced-sodium. Regular tomato and vegetable juices that are not marked as low-sodium or reduced-sodium. Dene. Olives. Grains Baked goods made with fat, such as croissants, muffins, or some breads. Dry pasta or rice meal packs. Meats and other proteins Fatty cuts of meat.  Ribs. Brien meat. Aldona. Bologna, salami, and other precooked or cured meats, such as sausages or meat loaves, that are not lean and low in sodium. Fat from the back of a pig (fatback). Bratwurst. Salted nuts and seeds. Canned beans with added salt. Canned or smoked fish. Whole eggs or egg yolks. Chicken or turkey with skin. Dairy Whole or 2% milk, cream, and half-and-half. Whole or full-fat cream cheese. Whole-fat or sweetened yogurt. Full-fat cheese. Nondairy creamers. Whipped toppings. Processed cheese and cheese spreads. Fats and oils Butter. Stick margarine. Lard. Shortening. Ghee. Bacon fat. Tropical oils, such as coconut, palm kernel, or palm oil. Seasonings and condiments Onion salt, garlic  salt, seasoned salt, table salt, and sea salt. Worcestershire sauce. Tartar sauce. Barbecue sauce. Teriyaki sauce. Soy sauce, including reduced-sodium soy sauce. Steak sauce. Canned and packaged gravies. Fish sauce. Oyster sauce. Cocktail sauce. Store-bought horseradish. Ketchup. Mustard. Meat flavorings and tenderizers. Bouillon cubes. Hot sauces. Pre-made or packaged marinades. Pre-made or packaged taco seasonings. Relishes. Regular salad dressings. Other foods Salted popcorn and pretzels. The items listed above may not be all the foods and drinks you should avoid. Talk to a dietitian to learn more. Where to find more information National Heart, Lung, and Blood Institute (NHLBI): buffalodrycleaner.gl American Heart Association (AHA): heart.org Academy of Nutrition and Dietetics: eatright.org National Kidney Foundation (NKF): kidney.org This information is not intended to replace advice given to you by your health care provider. Make sure you discuss any questions you have with your health care provider. Document Revised: 12/24/2022 Document Reviewed: 12/24/2022 Elsevier Patient Education  2024 [employer].

## 2024-11-23 ENCOUNTER — Emergency Department: Admission: EM | Admit: 2024-11-23 | Discharge: 2024-11-23 | Disposition: A | Discharge status: Home / Self Care

## 2024-11-23 ENCOUNTER — Emergency Department

## 2024-11-23 ENCOUNTER — Other Ambulatory Visit: Payer: Self-pay

## 2024-11-23 DIAGNOSIS — R030 Elevated blood-pressure reading, without diagnosis of hypertension: Secondary | ICD-10-CM | POA: Diagnosis present

## 2024-11-23 DIAGNOSIS — I10 Essential (primary) hypertension: Secondary | ICD-10-CM | POA: Diagnosis not present

## 2024-11-23 LAB — TROPONIN T, HIGH SENSITIVITY: Troponin T High Sensitivity: 15 ng/L (ref 0–19)

## 2024-11-23 LAB — BASIC METABOLIC PANEL WITH GFR
Anion gap: 12 (ref 5–15)
BUN: 10 mg/dL (ref 8–23)
CO2: 26 mmol/L (ref 22–32)
Calcium: 9.9 mg/dL (ref 8.9–10.3)
Chloride: 104 mmol/L (ref 98–111)
Creatinine, Ser: 0.93 mg/dL (ref 0.44–1.00)
GFR, Estimated: >60 mL/min (ref >60)
Glucose, Bld: 101 mg/dL — ABNORMAL HIGH (ref 70–99)
Potassium: 3.8 mmol/L (ref 3.5–5.1)
Sodium: 142 mmol/L (ref 135–145)

## 2024-11-23 LAB — CBC
HCT: 37.7 % (ref 36.0–46.0)
Hemoglobin: 12.7 g/dL (ref 12.0–15.0)
MCH: 28.5 pg (ref 26.0–34.0)
MCHC: 33.7 g/dL (ref 30.0–36.0)
MCV: 84.5 fL (ref 80.0–100.0)
Platelets: 259 K/uL (ref 150–400)
RBC: 4.46 MIL/uL (ref 3.87–5.11)
RDW: 13.1 % (ref 11.5–15.5)
WBC: 8.7 K/uL (ref 4.0–10.5)
nRBC: 0 % (ref 0.0–0.2)

## 2024-11-23 NOTE — ED Provider Notes (Signed)
 [facility] Emergency Department Provider Note     Event Date/Time   First MD Initiated Contact with Patient 11/23/24 2022     (approximate)   History   Hypertension   HPI  Lisa Bass is a 73 y.o. female with a past medical history of HTN, HLD, anxiety presents to the ED for evaluation of elevated blood pressure and pulse rate noted today.  Patient reports she takes her blood pressure multiple times during the day and noticed that her pulse rate was high.  She reports she is compliant with her losartan  and amlodipine  daily.  She reports due to her elevated blood pressure she took rosuvastatin  and Zetia  to help improve with her pulse rate.  She denies any chest pain, shortness of breath, headache, lightheadedness, dizziness or any other symptoms.  Patient does endorse increased stress and anxiety that could contribute to her symptoms.     Physical Exam   Triage Vital Signs: ED Triage Vitals  Encounter Vitals Group     BP 11/23/24 1816 (!) 160/87     Girls Systolic BP Percentile --      Girls Diastolic BP Percentile --      Boys Systolic BP Percentile --      Boys Diastolic BP Percentile --      Pulse Rate 11/23/24 1816 (!) 113     Resp 11/23/24 1816 20     Temp 11/23/24 1816 97.9 F (36.6 C)     Temp src --      SpO2 11/23/24 1816 97 %     Weight 11/23/24 1817 146 lb (66.2 kg)     Height 11/23/24 1817 5' 1 (1.549 m)     Head Circumference --      Peak Flow --      Pain Score 11/23/24 1817 0     Pain Loc --      Pain Education --      Exclude from Growth Chart --     Most recent vital signs: Vitals:   11/23/24 1816 11/23/24 2152  BP: (!) 160/87 (!) 144/79  Pulse: (!) 113 (!) 117  Resp: 20 19  Temp: 97.9 F (36.6 C) 98 F (36.7 C)  SpO2: 97% 99%   General Well-appearing.  Awake, no distress.  Anxious. HEENT NCAT. PERRL. EOMI.  CV:  Good peripheral perfusion.  Normal heart sounds RESP:  Normal effort.  LCTAB ABD:  No  distention.  Soft, nontender  ED Results / Procedures / Treatments   Labs (all labs ordered are listed, but only abnormal results are displayed) Labs Reviewed  BASIC METABOLIC PANEL WITH GFR - Abnormal; Notable for the following components:      Result Value   Glucose, Bld 101 (*)    All other components within normal limits  CBC  TROPONIN T, HIGH SENSITIVITY  TROPONIN T, HIGH SENSITIVITY   RADIOLOGY  I personally viewed and evaluated these images as part of my medical decision making, as well as reviewing the written report by the radiologist.  ED Provider Interpretation: normal   DG Chest 2 View Result Date: 11/23/2024 CLINICAL DATA:  Hypertension. EXAM: CHEST - 2 VIEW COMPARISON:  04/27/2024 FINDINGS: Prior median sternotomy.The cardiomediastinal contours are normal. The lungs are clear. Pulmonary vasculature is normal. No consolidation, pleural effusion, or pneumothorax. No acute osseous abnormalities are seen. IMPRESSION: No acute chest findings. Electronically Signed   By: Andrea Gasman M.D.   On: 11/23/2024 21:20    PROCEDURES:  Critical Care performed: No  Procedures  MEDICATIONS ORDERED IN ED: Medications - No data to display  IMPRESSION / MDM / ASSESSMENT AND PLAN / ED COURSE  I reviewed the triage vital signs and the nursing notes.                                 73 y.o. female presents to the emergency department for evaluation and treatment of elevated BP. See HPI for further details.   Differential diagnosis includes, but is not limited to arrhythmia, electrolyte abnormality, medication effects, low suspicion for ACS, PE, HTN urgency.  Patient's presentation is most consistent with acute complicated illness / injury requiring diagnostic workup.  Patient presents with concerns for elevated BP and increased pulse rate.  She reports frequent home BP checks and is highly focused on her readings, which appears to be contributing to her anxiety.  She took 1  dose of rosuvastatin  and another cholesterol medication to help with her elevated blood pressure.  She denies any chest pain, shortness of breath, headache, vision changes, syncope, dizziness or neurologic deficits.  She is otherwise asymptomatic at presentation.  Vital signs do show elevated BP with mild tachycardia.  EKG obtained and shows atrial flutter however rate is controlled.  No apparent ischemic changes noted.  No evidence of acute endorgan damage on exam.  Thorough discussion with patient of medication indications provided.  Discussed not to take lovastatin or's ST unless otherwise instructed by her primary provider or cardiologist.  Given her EKG I have advised a close follow-up with cardiology for further evaluation and close follow-up with her primary care provider to discuss medication management.  Patient is in stable condition for discharge home at this time.  Reassurance provided.  Strict ED return precaution discussed.  Patient verbalized understanding.  She is in stable condition for discharge home.  FINAL CLINICAL IMPRESSION(S) / ED DIAGNOSES   Final diagnoses:  Hypertension, unspecified type   Rx / DC Orders   ED Discharge Orders     None      Note:  This document was prepared using Dragon voice recognition software and may include unintentional dictation errors.    Margrette, Nathaneal Sommers A, PA-C 11/23/24 [id number]    Claudene Rover, MD 11/23/24 [phone]

## 2024-11-23 NOTE — ED Triage Notes (Signed)
 Pt to ED for hypertension. Denies all sx. States routinely checks pressure and just noticed its higher. Took bp meds today. Denies chest pain. Pt appears tearful and anxious

## 2024-11-23 NOTE — Discharge Instructions (Addendum)
 Continue taking amlodipine  and losartan  as prescribed.  Please schedule appointment with your primary care provider to discuss blood pressure medication management.  Also follow-up with cardiology for further evaluation of asymptomatic hypertension.

## 2024-11-23 NOTE — ED Notes (Signed)
Patient transported back from X-ray 

## 2024-11-24 ENCOUNTER — Telehealth: Payer: Self-pay | Admitting: Nurse Practitioner

## 2024-11-24 NOTE — Telephone Encounter (Signed)
 Copied from CRM #[id number]. Topic: Appointments - Scheduling Inquiry for Clinic >> Nov 24, 2024 11:06 AM Sophia H wrote: Reason for CRM: Patient states she was in the ER yesterday - had some confusion with meds and had a bad reaction. States she was told at ER to follow up today 12/05. Advised I didn't have anything with PCP today as patient does not want to see anyone else. Please advise, patient also has an appt on 12/10 with primary.   # [phone]

## 2024-11-24 NOTE — Telephone Encounter (Signed)
 Already scheduled for 12/10

## 2024-11-27 NOTE — Telephone Encounter (Signed)
 Patient has been called and a message left for them to return the call to the office. Ok for E2C2 to review if/when they return the call. Please do not transfer to CAL rather send a CRM if needed only.  Please advise patient this has been reviewed with her PCP and she advises to attend the 11/29/2024 appointment to discuss.

## 2024-11-29 ENCOUNTER — Ambulatory Visit (INDEPENDENT_AMBULATORY_CARE_PROVIDER_SITE_OTHER): Admitting: Nurse Practitioner

## 2024-11-29 ENCOUNTER — Encounter: Payer: Self-pay | Admitting: Nurse Practitioner

## 2024-11-29 ENCOUNTER — Ambulatory Visit: Payer: Self-pay | Admitting: Nurse Practitioner

## 2024-11-29 VITALS — BP 137/78 | HR 69 | Temp 98.3°F | Resp 16 | Ht 60.98 in | Wt 140.4 lb

## 2024-11-29 DIAGNOSIS — I4891 Unspecified atrial fibrillation: Secondary | ICD-10-CM | POA: Insufficient documentation

## 2024-11-29 DIAGNOSIS — I10 Essential (primary) hypertension: Secondary | ICD-10-CM

## 2024-11-29 DIAGNOSIS — E876 Hypokalemia: Secondary | ICD-10-CM

## 2024-11-29 DIAGNOSIS — R7309 Other abnormal glucose: Secondary | ICD-10-CM

## 2024-11-29 DIAGNOSIS — E782 Mixed hyperlipidemia: Secondary | ICD-10-CM

## 2024-11-29 DIAGNOSIS — F418 Other specified anxiety disorders: Secondary | ICD-10-CM

## 2024-11-29 LAB — URINALYSIS, ROUTINE W REFLEX MICROSCOPIC
Bilirubin, UA: NEGATIVE
Glucose, UA: NEGATIVE
Ketones, UA: NEGATIVE
Leukocytes,UA: NEGATIVE
Nitrite, UA: NEGATIVE
Protein,UA: NEGATIVE
Specific Gravity, UA: 1.02 (ref 1.005–1.030)
Urobilinogen, Ur: 0.2 mg/dL (ref 0.2–1.0)
pH, UA: 6 (ref 5.0–7.5)

## 2024-11-29 LAB — MICROSCOPIC EXAMINATION: Bacteria, UA: NONE SEEN

## 2024-11-29 NOTE — Progress Notes (Signed)
 Contacted via MyChart  Urine overall showing no infection, there is a little blood present but that could be from irritation and is something we can continue to monitor. This can vary on samples. Any questions? Keep being amazing!!  Thank you for allowing me to participate in your care.  I appreciate you. Kindest regards, Laryssa Hassing

## 2024-11-29 NOTE — Assessment & Plan Note (Signed)
 New onset 11/23/24. Educated her husband and her at length today on a-fib, what it is and risks with this. Advised her to continue Eliquis and discussed possible side effects. If any worsening abdominal pain to stop taking and alert PCP or cardiologist, as may need to adjust medications. Will check UA today, as ?UTI with her recent abdominal pain. Labs today.  Continue to collaborate with cardiology, recent note reviewed.

## 2024-11-29 NOTE — Assessment & Plan Note (Signed)
 Chronic, ongoing.  Recommend she continue Crestor  10 MG Monday/Wednesday/Friday schedule for prevention and to take Zetia  along with this.  Discussed with her that recent elevated HR was more likely related to new onset a-fib vs medications, since she has been taking them for some time without issue. Discussed with her risks of not taking medication to reduce plaque build up, including stroke or MI. Could consider Repatha or Praluent in future if does not tolerate oral medications.

## 2024-11-29 NOTE — Assessment & Plan Note (Signed)
 Chronic, ongoing.  BP stable today, is anxious this morning.  Will continue Losartan  100 MG daily and Amlodipine  5 MG daily for now. Reiterated importance of taking these daily to prevent BP elevations which could lead to acute stroke.  Recommend she continue to monitor BP daily at home and notify provider if consistent above 130/80. Discussed with her not to check BP more than daily, at present is checking frequently during the daytime which suspect is causing more anxiety for her. Labs: BMP and CBC. Recommend focus on DASH diet and modest weight loss.  Continue collaboration with cardiology, appreciate their input.  Urine ALB 28 February 2024.  Could consider changing Losartan  to Valsartan or Telmisartan in future if elevations, may allow longer acting effect.

## 2024-11-29 NOTE — Progress Notes (Signed)
 BP 137/78 (BP Location: Left Arm, Patient Position: Sitting, Cuff Size: Normal)   Pulse 69   Temp 98.3 F (36.8 C) (Oral)   Resp 16   Ht 5' 0.98 (1.549 m)   Wt 140 lb 6.4 oz (63.7 kg)   LMP  (LMP Unknown)   BMI 26.54 kg/m    Subjective:    Patient ID: Lisa Bass, female    DOB: [birth year], 73 y.o.   MRN: [id number]  HPI: Lisa Bass is a 73 y.o. female  Chief Complaint  Patient presents with   Hypertension    Has not been going very well. This 166/80 HR 75. HR has been running higher towards 130-140's.    Medication Problem    Has questions about everything she is on. Was seen in ED and told she has A-fib and then seen by cardiology. Was put on Eliquis and feels she had a reaction last night of an all over cooling sensation and felt her stomach retained fluid.    HYPERTENSION without Chronic Kidney Disease Currently taking Amlodipine  and Losartan . Was seen by cardiology on 11/27/24 due to a visit to the ER on 11/23/24 for elevated HR and BP. Started on treatment for atrial flutter, Eliquis.  Not currently taking Rosuvastatin  and Zetia , as took this prior to ER and feels this sent her into ER -- she reports they told her it made her heart rate go up. Last night she feels she had a side effect to Eliquis, felt like cold running through her and like retaining fluid -- felt like when she had acute HF in past. This was her second dose of medication. She is fearful of taking another dose as did not like that sensation and concerned there was fluid retained. Hypertension status: stable  Satisfied with current treatment? yes Duration of hypertension: chronic BP monitoring frequency:  multiple times a day BP range:  BP medication side effects:  no Medication compliance: fair compliance Aspirin : no Recurrent headaches: no Visual changes: no Palpitations: no Dyspnea: no Chest pain: no Lower extremity edema: no Dizzy/lightheaded: no      11/29/2024    8:20 AM 08/29/2024     1:59 PM 07/20/2024    2:53 PM 06/06/2024    4:10 PM 02/22/2024    9:14 AM  Depression screen PHQ 2/9  Decreased Interest 1 0 0 0 0  Down, Depressed, Hopeless 1 0 0 0 1  PHQ - 2 Score 2 0 0 0 1  Altered sleeping 1 0 0 0 0  Tired, decreased energy 2 0 0 1 0  Change in appetite 2 0 0 0 0  Feeling bad or failure about yourself  1 0 0 0 0  Trouble concentrating 1 0 0 1 0  Moving slowly or fidgety/restless 1 0 0 0 0  Suicidal thoughts 1 0 0 0 0  PHQ-9 Score 11 0  0  2  1   Difficult doing work/chores Somewhat difficult  Not difficult at all Not difficult at all Not difficult at all     Data saved with a previous flowsheet row definition       11/29/2024    8:20 AM 08/29/2024    1:59 PM 06/06/2024    4:11 PM 02/22/2024    9:14 AM  GAD 7 : Generalized Anxiety Score  Nervous, Anxious, on Edge 2 0 0 1  Control/stop worrying 1 1 1  0  Worry too much - different things 1 1 0 0  Trouble  relaxing 1 0 0 0  Restless 1 0 0 0  Easily annoyed or irritable 1 0 0 1  Afraid - awful might happen 1 0 0 0  Total GAD 7 Score 8 2 1 2   Anxiety Difficulty Somewhat difficult  Not difficult at all Not difficult at all      Relevant past medical, surgical, family and social history reviewed and updated as indicated. Interim medical history since our last visit reviewed. Allergies and medications reviewed and updated.  Review of Systems  Constitutional:  Negative for activity change, appetite change, diaphoresis, fatigue and fever.  Respiratory:  Negative for cough, chest tightness, shortness of breath and wheezing.   Cardiovascular:  Negative for chest pain, palpitations and leg swelling.  Gastrointestinal:  Positive for abdominal pain (for short period last night). Negative for abdominal distention, blood in stool, constipation, diarrhea, nausea and vomiting.  Neurological: Negative.   Psychiatric/Behavioral:  Negative for decreased concentration, self-injury, sleep disturbance and suicidal ideas. The patient  is nervous/anxious.    Per HPI unless specifically indicated above     Objective:    BP 137/78 (BP Location: Left Arm, Patient Position: Sitting, Cuff Size: Normal)   Pulse 69   Temp 98.3 F (36.8 C) (Oral)   Resp 16   Ht 5' 0.98 (1.549 m)   Wt 140 lb 6.4 oz (63.7 kg)   LMP  (LMP Unknown)   BMI 26.54 kg/m   Wt Readings from Last 3 Encounters:  11/29/24 140 lb 6.4 oz (63.7 kg)  11/23/24 146 lb (66.2 kg)  08/29/24 151 lb 3.2 oz (68.6 kg)    Physical Exam Vitals and nursing note reviewed.  Constitutional:      General: She is awake. She is not in acute distress.    Appearance: She is well-developed and well-groomed. She is not ill-appearing or toxic-appearing.  HENT:     Head: Normocephalic.     Right Ear: Hearing and external ear normal.     Left Ear: Hearing and external ear normal.  Eyes:     General: Lids are normal.        Right eye: No discharge.        Left eye: No discharge.     Conjunctiva/sclera: Conjunctivae normal.     Pupils: Pupils are equal, round, and reactive to light.  Neck:     Thyroid : No thyromegaly.     Vascular: No carotid bruit.  Cardiovascular:     Rate and Rhythm: Normal rate and regular rhythm.     Heart sounds: Normal heart sounds. No murmur heard.    No gallop.  Pulmonary:     Effort: Pulmonary effort is normal. No accessory muscle usage or respiratory distress.     Breath sounds: Normal breath sounds. No decreased breath sounds, wheezing or rales.  Abdominal:     General: Bowel sounds are normal. There is no distension.     Palpations: Abdomen is soft. There is no mass.     Tenderness: There is abdominal tenderness in the suprapubic area. There is no right CVA tenderness or left CVA tenderness.  Musculoskeletal:     Cervical back: Normal range of motion and neck supple.     Right lower leg: No edema.     Left lower leg: No edema.  Lymphadenopathy:     Cervical: No cervical adenopathy.  Skin:    General: Skin is warm and dry.   Neurological:     Mental Status: She is alert and oriented to  person, place, and time.     Deep Tendon Reflexes: Reflexes are normal and symmetric.     Reflex Scores:      Brachioradialis reflexes are 2+ on the right side and 2+ on the left side.      Patellar reflexes are 2+ on the right side and 2+ on the left side. Psychiatric:        Attention and Perception: Attention normal.        Mood and Affect: Mood normal.        Speech: Speech normal.        Behavior: Behavior normal. Behavior is cooperative.        Thought Content: Thought content normal.    Results for orders placed or performed during the hospital encounter of 11/23/24  CBC   Collection Time: 11/23/24  9:00 PM  Result Value Ref Range   WBC 8.7 4.0 - 10.5 K/uL   RBC 4.46 3.87 - 5.11 MIL/uL   Hemoglobin 12.7 12.0 - 15.0 g/dL   HCT 62.2 63.9 - 53.9 %   MCV 84.5 80.0 - 100.0 fL   MCH 28.5 26.0 - 34.0 pg   MCHC 33.7 30.0 - 36.0 g/dL   RDW 86.8 88.4 - 84.4 %   Platelets 259 150 - 400 K/uL   nRBC 0.0 0.0 - 0.2 %  Basic metabolic panel   Collection Time: 11/23/24  9:00 PM  Result Value Ref Range   Sodium 142 135 - 145 mmol/L   Potassium 3.8 3.5 - 5.1 mmol/L   Chloride 104 98 - 111 mmol/L   CO2 26 22 - 32 mmol/L   Glucose, Bld 101 (H) 70 - 99 mg/dL   BUN 10 8 - 23 mg/dL   Creatinine, Ser 9.06 0.44 - 1.00 mg/dL   Calcium  9.9 8.9 - 10.3 mg/dL   GFR, Estimated >39 >39 mL/min   Anion gap 12 5 - 15  Troponin T, High Sensitivity   Collection Time: 11/23/24  9:00 PM  Result Value Ref Range   Troponin T High Sensitivity 15 0 - 19 ng/L      Assessment & Plan:   Problem List Items Addressed This Visit       Cardiovascular and Mediastinum   Hypertension   Chronic, ongoing.  BP stable today, is anxious this morning.  Will continue Losartan  100 MG daily and Amlodipine  5 MG daily for now. Reiterated importance of taking these daily to prevent BP elevations which could lead to acute stroke.  Recommend she continue to  monitor BP daily at home and notify provider if consistent above 130/80. Discussed with her not to check BP more than daily, at present is checking frequently during the daytime which suspect is causing more anxiety for her. Labs: BMP and CBC. Recommend focus on DASH diet and modest weight loss.  Continue collaboration with cardiology, appreciate their input.  Urine ALB 28 February 2024.  Could consider changing Losartan  to Valsartan or Telmisartan in future if elevations, may allow longer acting effect.       Relevant Medications   ELIQUIS 5 MG TABS tablet   Other Relevant Orders   Basic metabolic panel with GFR   Urinalysis, Routine w reflex microscopic   Atrial fibrillation (HCC) - Primary   New onset 11/23/24. Educated her husband and her at length today on a-fib, what it is and risks with this. Advised her to continue Eliquis and discussed possible side effects. If any worsening abdominal pain to stop taking and alert  PCP or cardiologist, as may need to adjust medications. Will check UA today, as ?UTI with her recent abdominal pain. Labs today.  Continue to collaborate with cardiology, recent note reviewed.      Relevant Medications   ELIQUIS 5 MG TABS tablet   Other Relevant Orders   CBC with Differential/Platelet     Other   Hyperlipidemia   Chronic, ongoing.  Recommend she continue Crestor  10 MG Monday/Wednesday/Friday schedule for prevention and to take Zetia  along with this.  Discussed with her that recent elevated HR was more likely related to new onset a-fib vs medications, since she has been taking them for some time without issue. Discussed with her risks of not taking medication to reduce plaque build up, including stroke or MI. Could consider Repatha or Praluent in future if does not tolerate oral medications.      Relevant Medications   ELIQUIS 5 MG TABS tablet   Other Relevant Orders   Lipid Panel w/o Chol/HDL Ratio   Depression with anxiety   Chronic, exacerbated by current  health issues and concerns.  Denies SI/HI.  Continue current medication regimen and adjust as needed.  Due to age would benefit from change to Zoloft or alternate SSRI in future and discontinuation Paxil , she is aware of this and wishes to maintain regimen at this time.  Could consider adding on Buspar as well to assist with anxiety.       Relevant Medications   PARoxetine  (PAXIL ) 20 MG tablet    I personally spent a total of 30 minutes in the care of the patient today including preparing to see the patient, getting/reviewing separately obtained history, performing a medically appropriate exam/evaluation, counseling and educating, and placing orders. Lengthy education on atrial fibrillation provided.  Follow up plan: Return in about 4 weeks (around 12/27/2024) for HTN/HLD, A-Fib.

## 2024-11-29 NOTE — Assessment & Plan Note (Signed)
 Chronic, exacerbated by current health issues and concerns.  Denies SI/HI.  Continue current medication regimen and adjust as needed.  Due to age would benefit from change to Zoloft or alternate SSRI in future and discontinuation Paxil , she is aware of this and wishes to maintain regimen at this time.  Could consider adding on Buspar as well to assist with anxiety.

## 2024-11-30 LAB — BASIC METABOLIC PANEL WITH GFR
BUN/Creatinine Ratio: 10 — ABNORMAL LOW (ref 12–28)
BUN: 10 mg/dL (ref 8–27)
CO2: 26 mmol/L (ref 20–29)
Calcium: 9.5 mg/dL (ref 8.7–10.3)
Chloride: 104 mmol/L (ref 96–106)
Creatinine, Ser: 0.97 mg/dL (ref 0.57–1.00)
Glucose: 122 mg/dL — ABNORMAL HIGH (ref 70–99)
Potassium: 3.1 mmol/L — ABNORMAL LOW (ref 3.5–5.2)
Sodium: 143 mmol/L (ref 134–144)
eGFR: 61 mL/min/1.73 (ref >59)

## 2024-11-30 LAB — CBC WITH DIFFERENTIAL/PLATELET
Basophils Absolute: 0.1 x10E3/uL (ref 0.0–0.2)
Basos: 1 %
EOS (ABSOLUTE): 0.1 x10E3/uL (ref 0.0–0.4)
Eos: 2 %
Hematocrit: 34.9 % (ref 34.0–46.6)
Hemoglobin: 11.5 g/dL (ref 11.1–15.9)
Immature Grans (Abs): 0 x10E3/uL (ref 0.0–0.1)
Immature Granulocytes: 0 %
Lymphocytes Absolute: 1.4 x10E3/uL (ref 0.7–3.1)
Lymphs: 22 %
MCH: 28.8 pg (ref 26.6–33.0)
MCHC: 33 g/dL (ref 31.5–35.7)
MCV: 87 fL (ref 79–97)
Monocytes Absolute: 0.4 x10E3/uL (ref 0.1–0.9)
Monocytes: 7 %
Neutrophils Absolute: 4.3 x10E3/uL (ref 1.4–7.0)
Neutrophils: 68 %
Platelets: 226 x10E3/uL (ref 150–450)
RBC: 4 x10E6/uL (ref 3.77–5.28)
RDW: 13.4 % (ref 11.7–15.4)
WBC: 6.3 x10E3/uL (ref 3.4–10.8)

## 2024-11-30 LAB — LIPID PANEL W/O CHOL/HDL RATIO
Cholesterol, Total: 137 mg/dL (ref 100–199)
HDL: 63 mg/dL (ref >39)
LDL Chol Calc (NIH): 62 mg/dL (ref 0–99)
Triglycerides: 58 mg/dL (ref 0–149)
VLDL Cholesterol Cal: 12 mg/dL (ref 5–40)

## 2024-11-30 MED ORDER — POTASSIUM CHLORIDE CRYS ER 10 MEQ PO TBCR: 10.0000 meq | EXTENDED_RELEASE_TABLET | Freq: Every day | ORAL | 0 refills | Status: DC

## 2024-11-30 NOTE — Progress Notes (Signed)
 Contacted via MyChart -- needs lab only visit for Monday please  Good evening Cheryn, your labs have returned: - Lipid panel is much improved with Zetia  and Rosuvastatin . Continue these as your LDL is below 70 now, which is great. Previous was 135. - CBC shows no anemia or infection. - Kidney function, creatinine and eGFR, is normal. Potassium is a little low. I am going to send in a supplement to take for 4 days and would like to recheck this outpatient on Monday. Glucose is also a bit elevated, we will check A1c outpatient as well to ensure no diabetes. Last check was normal. Any questions? Keep being incredible!!  Thank you for allowing me to participate in your care.  I appreciate you. Kindest regards, Raheem Kolbe

## 2024-12-04 ENCOUNTER — Other Ambulatory Visit

## 2024-12-06 ENCOUNTER — Other Ambulatory Visit

## 2024-12-06 DIAGNOSIS — E876 Hypokalemia: Secondary | ICD-10-CM

## 2024-12-06 DIAGNOSIS — R7309 Other abnormal glucose: Secondary | ICD-10-CM

## 2024-12-07 ENCOUNTER — Ambulatory Visit: Payer: Self-pay | Admitting: Nurse Practitioner

## 2024-12-07 LAB — HEMOGLOBIN A1C
Est. average glucose Bld gHb Est-mCnc: 108 mg/dL
Hgb A1c MFr Bld: 5.4 % (ref 4.8–5.6)

## 2024-12-07 LAB — POTASSIUM: Potassium: 3.6 mmol/L (ref 3.5–5.2)

## 2024-12-07 NOTE — Progress Notes (Signed)
 Contacted via [employer]!! Potassium is back to normal and you have no prediabetes or diabetes.  Great job!!

## 2024-12-25 ENCOUNTER — Telehealth: Payer: Self-pay

## 2024-12-25 NOTE — Telephone Encounter (Unsigned)
 Copied from CRM [id number]. Topic: Clinical - Medication Question >> Dec 25, 2024  4:07 PM Geneva B wrote: Reason for CRM: patient has questions about her rx  losartan  (COZAAR ) 100 MG tablet  please call pt back [phone]

## 2024-12-26 ENCOUNTER — Encounter: Payer: Self-pay | Admitting: Nurse Practitioner

## 2024-12-27 NOTE — Telephone Encounter (Signed)
Will respond via MyChart.

## 2024-12-27 NOTE — Telephone Encounter (Signed)
 Copied from [place] message so I can close that message and only have 1 note open to address  I have a question concerning my losartan . I need to know if I should be taking losartan  with hctz. I am currently taking losartan  with potassium.  After an appointment with Dr Hilarie can you tell me if I need to change

## 2024-12-27 NOTE — Telephone Encounter (Signed)
 Closing thread as patient also called with same questions and that encounter has been routed to PCP to assist with.

## 2024-12-30 NOTE — Patient Instructions (Incomplete)
Be Involved in Caring For Your Health:  Taking Medications When medications are taken as directed, they can greatly improve your health. But if they are not taken as prescribed, they may not work. In some cases, not taking them correctly can be harmful. To help ensure your treatment remains effective and safe, understand your medications and how to take them. Bring your medications to each visit for review by your provider.  Your lab results, notes, and after visit summary will be available on My Chart. We strongly encourage you to use this feature. If lab results are abnormal the clinic will contact you with the appropriate steps. If the clinic does not contact you assume the results are satisfactory. You can always view your results on My Chart. If you have questions regarding your health or results, please contact the clinic during office hours. You can also ask questions on My Chart.  We at [facility] are grateful that you chose [place] to provide your care. We strive to provide evidence-based and compassionate care and are always looking for feedback. If you get a survey from the clinic please complete this so we can hear your opinions.  Atrial Fibrillation Atrial fibrillation (AFib) is a type of heartbeat that is irregular or fast. If you have AFib, your heart beats without any order. This makes it hard for your heart to pump blood in a normal way. AFib may come and go, or it may become a long-lasting problem. If AFib is not treated, it can put you at higher risk for stroke, heart failure, and other heart problems. What are the causes? AFib may be caused by diseases that damage the heart's electrical system. They include: High blood pressure. Heart failure. Heart valve diseases. Heart surgery. Diabetes. Thyroid disease. Kidney disease. Lung diseases, such as pneumonia or COPD. Sleep apnea. Sometimes the cause is not known. What increases the risk? You are more likely to  develop AFib if: You are older. You exercise often and very hard. You have a family history of AFib. You are female. You are Caucasian. You are overweight. You smoke. You drink a lot of alcohol. What are the signs or symptoms? Common symptoms of this condition include: A feeling that your heart is beating very fast. Chest pain or discomfort. Feeling short of breath. Suddenly feeling light-headed or weak. Getting tired easily during activity. Fainting. Sweating. In some cases, there are no symptoms. How is this treated? Medicines to: Prevent blood clots. Treat heart rate or heart rhythm problems. Using devices, such as a pacemaker, to correct heart rhythm problems. Doing surgery to remove the part of the heart that sends bad signals. Closing an area where clots can form in the heart (left atrial appendage). In some cases, your doctor will treat other underlying conditions. Follow these instructions at home: Medicines Take over-the-counter and prescription medicines only as told by your doctor. Do not take any new medicines without first talking to your doctor. If you are taking blood thinners: Talk with your doctor before taking aspirin or NSAIDs, such as ibuprofen. Take your medicines as told. Take them at the same time each day. Do not do things that could hurt or bruise you. Be careful to avoid falls. Wear an alert bracelet or carry a card that says you take blood thinners. Lifestyle Do not smoke or use any products that contain nicotine or tobacco. If you need help quitting, ask your doctor. Eat heart-healthy foods. Talk with your doctor about the right eating plan  for you. Exercise regularly as told by your doctor. Do not drink alcohol. Lose weight if you are overweight. General instructions If you have sleep apnea, treat it as told by your doctor. Do not use diet pills unless your doctor says they are safe for you. Diet pills may make heart problems worse. Keep all  follow-up visits. Your doctor will check your heart rate and rhythm regularly. Contact a doctor if: You notice a change in the speed, rhythm, or strength of your heartbeat. You are taking a blood-thinning medicine and you get more bruising. You get tired more easily when you move or exercise. You have a sudden change in weight. Get help right away if:  You have pain in your chest. You have trouble breathing. You have side effects of blood thinners, such as blood in your vomit, poop (stool), or pee (urine), or bleeding that cannot stop. You have any signs of a stroke. "BE FAST" is an easy way to remember the main warning signs: B - Balance. Dizziness, sudden trouble walking, or loss of balance. E - Eyes. Trouble seeing or a change in how you see. F - Face. Sudden weakness or loss of feeling in the face. The face or eyelid may droop on one side. A - Arms.Weakness or loss of feeling in an arm. This happens suddenly and usually on one side of the body. S - Speech. Sudden trouble speaking, slurred speech, or trouble understanding what people say. T - Time.Time to call emergency services. Write down what time symptoms started. You have other signs of a stroke, such as: A sudden, very bad headache with no known cause. Feeling like you may vomit (nausea). Vomiting. A seizure. These symptoms may be an emergency. Get help right away. Call 911. Do not wait to see if the symptoms will go away. Do not drive yourself to the hospital. This information is not intended to replace advice given to you by your health care provider. Make sure you discuss any questions you have with your health care provider. Document Revised: 08/26/2022 Document Reviewed: 08/26/2022 Elsevier Patient Education  2024 [employer].

## 2025-01-03 ENCOUNTER — Ambulatory Visit: Admitting: Nurse Practitioner

## 2025-01-03 ENCOUNTER — Telehealth: Payer: Self-pay

## 2025-01-03 NOTE — Telephone Encounter (Signed)
 Copied from CRM [id number]. Topic: Clinical - Medication Question >> Jan 03, 2025  2:33 PM Terri G wrote: Reason for CRM: Patient had more questions about zoloft that her and Dr.Jolene were speaking about and she had further questions and wanted her or her nurse to call her and wanted to know if she can call it for her .

## 2025-01-05 NOTE — Telephone Encounter (Signed)
 Called and spoke to patient. She states that previously, Jolene mentioned to her something about switching her medication to Zoloft. Patient states she would be ok with doing this if Jolene would like to do so.

## 2025-01-07 NOTE — Patient Instructions (Incomplete)

## 2025-01-09 ENCOUNTER — Ambulatory Visit
Admission: RE | Admit: 2025-01-09 | Discharge: 2025-01-09 | Disposition: A | Discharge status: Home / Self Care | Source: Ambulatory Visit | Attending: Nurse Practitioner | Admitting: Nurse Practitioner

## 2025-01-09 ENCOUNTER — Ambulatory Visit
Admission: RE | Admit: 2025-01-09 | Discharge: 2025-01-09 | Disposition: A | Source: Ambulatory Visit | Attending: Nurse Practitioner | Admitting: Nurse Practitioner

## 2025-01-09 DIAGNOSIS — M85852 Other specified disorders of bone density and structure, left thigh: Secondary | ICD-10-CM | POA: Diagnosis present

## 2025-01-09 DIAGNOSIS — Z1231 Encounter for screening mammogram for malignant neoplasm of breast: Secondary | ICD-10-CM | POA: Diagnosis present

## 2025-01-10 ENCOUNTER — Ambulatory Visit: Payer: Self-pay | Admitting: Nurse Practitioner

## 2025-01-10 NOTE — Progress Notes (Signed)
Contacted via MyChart   Your bone density shows thinning bones (osteopenia) but not brittle (osteoporosis). We recommend Vitamin D supplementation of about 2,0000 IUs of over the counter Vitamin D3. In addition, we recommend a diet high in calcium with dairy and dark green leafy vegetables. We would like you to get plenty of weight bearing exercises with walking and resistance training such as light weights or resistance bands available with instructions at places such as [employer].  Any questions?

## 2025-01-11 ENCOUNTER — Ambulatory Visit (INDEPENDENT_AMBULATORY_CARE_PROVIDER_SITE_OTHER): Admitting: Nurse Practitioner

## 2025-01-11 ENCOUNTER — Encounter: Payer: Self-pay | Admitting: Nurse Practitioner

## 2025-01-11 VITALS — BP 126/78 | HR 85 | Temp 98.1°F | Ht 61.0 in | Wt 137.4 lb

## 2025-01-11 DIAGNOSIS — G250 Essential tremor: Secondary | ICD-10-CM | POA: Insufficient documentation

## 2025-01-11 DIAGNOSIS — I10 Essential (primary) hypertension: Secondary | ICD-10-CM | POA: Diagnosis not present

## 2025-01-11 DIAGNOSIS — I480 Paroxysmal atrial fibrillation: Secondary | ICD-10-CM

## 2025-01-11 DIAGNOSIS — E782 Mixed hyperlipidemia: Secondary | ICD-10-CM

## 2025-01-11 DIAGNOSIS — F418 Other specified anxiety disorders: Secondary | ICD-10-CM | POA: Diagnosis not present

## 2025-01-11 MED ORDER — LOSARTAN POTASSIUM-HCTZ 100-25 MG PO TABS: 1.0000 | ORAL_TABLET | Freq: Every day | ORAL | 3 refills | Status: AC

## 2025-01-11 NOTE — Assessment & Plan Note (Addendum)
 Chronic, ongoing for years.  Denies SI/HI.  Continue current medication regimen and adjust as needed.  Due to age would benefit from change to Zoloft or alternate SSRI in future and discontinuation Paxil , will consider next visit. She has been on Paxil  for years and on review all SSRI and SNRI have slight chance for increasing the effect of Eliquis, will monitor this closely as with her anxiety taking her off mood medication would worsen anxiety and quality of life. Could consider adding on Buspar as well to assist with anxiety.

## 2025-01-11 NOTE — Assessment & Plan Note (Signed)
 New onset 11/23/24. Have educated her at length on a-fib. Continue to collaborate with cardiology, recent note reviewed, and continue current medication regimen. She has been on Paxil  for years and on review all SSRI and SNRI have slight chance for increasing the effect of Eliquis, will monitor this closely as with her anxiety taking her off mood medication would worsen anxiety and quality of life. Discussed this with her today.

## 2025-01-11 NOTE — Progress Notes (Signed)
 "  BP 126/78 (BP Location: Left Arm, Patient Position: Sitting, Cuff Size: Normal)   Pulse 85   Temp 98.1 F (36.7 C) (Oral)   Ht 5' 1 (1.549 m)   Wt 137 lb 6.4 oz (62.3 kg)   LMP  (LMP Unknown)   SpO2 98%   BMI 25.96 kg/m    Subjective:    Patient ID: Lisa Bass, female    DOB: [birth year], 74 y.o.   MRN: [id number]  HPI: Lisa Bass is a 74 y.o. female  Chief Complaint  Patient presents with   Atrial Fibrillation   Hyperlipidemia   Hypertension   HYPERTENSION / HYPERLIPIDEMIA Taking Amlodipine  10 MG, Rosuvastatin  10 MG daily, and Zetia  10 MG. Has not been taking Losartan -HCTZ, was not sure what to be taking. She does have a baseline a resting tremor to both hands, which she reports has been present for many years. Has not been to neurology for this in the past. Satisfied with current treatment? yes Duration of hypertension: chronic BP monitoring frequency: weekly BP range: 140/85 to 187/87 BP medication side effects: no Duration of hyperlipidemia: chronic Cholesterol medication side effects: no Cholesterol supplements: none Medication compliance: good compliance Aspirin : no -- Eliquis Recent stressors: no Recurrent headaches: no Visual changes: no Palpitations: no Dyspnea: no Chest pain: no Lower extremity edema: no Dizzy/lightheaded: no   ATRIAL FIBRILLATION Taking Eliquis daily. Saw cardiology last on 12/25/24. At the time her Amlodipine  was increased to 10 MG. Atrial fibrillation status: stable Satisfied with current treatment: yes  Medication side effects:  no Medication compliance: good compliance Etiology of atrial fibrillation: unknown Palpitations:  no Chest pain:  no Dyspnea on exertion:  no Orthopnea:  no Syncope:  no Edema:  no Ventricular rate control: Not indicated Anti-coagulation: long acting   Continues Paxil .    01/11/2025    8:07 AM 11/29/2024    8:20 AM 08/29/2024    1:59 PM 07/20/2024    2:53 PM 06/06/2024    4:10 PM   Depression screen PHQ 2/9  Decreased Interest 1 1 0 0 0  Down, Depressed, Hopeless 1 1 0 0 0  PHQ - 2 Score 2 2 0 0 0  Altered sleeping 1 1 0 0 0  Tired, decreased energy 1 2 0 0 1  Change in appetite 1 2 0 0 0  Feeling bad or failure about yourself  1 1 0 0 0  Trouble concentrating 1 1 0 0 1  Moving slowly or fidgety/restless 1 1 0 0 0  Suicidal thoughts 0 1 0 0 0  PHQ-9 Score 8 11 0  0  2   Difficult doing work/chores Somewhat difficult Somewhat difficult  Not difficult at all Not difficult at all     Data saved with a previous flowsheet row definition       01/11/2025    8:07 AM 11/29/2024    8:20 AM 08/29/2024    1:59 PM 06/06/2024    4:11 PM  GAD 7 : Generalized Anxiety Score  Nervous, Anxious, on Edge 1 2  0  0   Control/stop worrying 1 1  1  1    Worry too much - different things 0 1  1  0   Trouble relaxing 0 1  0  0   Restless 0 1  0  0   Easily annoyed or irritable 0 1  0  0   Afraid - awful might happen 0 1  0  0  Total GAD 7 Score 2 8 2 1   Anxiety Difficulty Somewhat difficult Somewhat difficult  Not difficult at all     Data saved with a previous flowsheet row definition   Relevant past medical, surgical, family and social history reviewed and updated as indicated. Interim medical history since our last visit reviewed. Allergies and medications reviewed and updated.  Review of Systems  Constitutional:  Negative for activity change, appetite change, diaphoresis, fatigue and fever.  Respiratory:  Negative for cough, chest tightness, shortness of breath and wheezing.   Cardiovascular:  Negative for chest pain, palpitations and leg swelling.  Gastrointestinal: Negative.   Neurological:  Positive for tremors.  Psychiatric/Behavioral: Negative.     Per HPI unless specifically indicated above     Objective:    BP 126/78 (BP Location: Left Arm, Patient Position: Sitting, Cuff Size: Normal)   Pulse 85   Temp 98.1 F (36.7 C) (Oral)   Ht 5' 1 (1.549 m)   Wt  137 lb 6.4 oz (62.3 kg)   LMP  (LMP Unknown)   SpO2 98%   BMI 25.96 kg/m   Wt Readings from Last 3 Encounters:  01/11/25 137 lb 6.4 oz (62.3 kg)  11/29/24 140 lb 6.4 oz (63.7 kg)  11/23/24 146 lb (66.2 kg)    Physical Exam Vitals and nursing note reviewed.  Constitutional:      General: She is awake. She is not in acute distress.    Appearance: She is well-developed and well-groomed. She is not ill-appearing or toxic-appearing.  HENT:     Head: Normocephalic.     Right Ear: Hearing and external ear normal.     Left Ear: Hearing and external ear normal.  Eyes:     General: Lids are normal.        Right eye: No discharge.        Left eye: No discharge.     Conjunctiva/sclera: Conjunctivae normal.     Pupils: Pupils are equal, round, and reactive to light.  Neck:     Thyroid : No thyromegaly.     Vascular: No carotid bruit.  Cardiovascular:     Rate and Rhythm: Normal rate and regular rhythm.     Heart sounds: Normal heart sounds. No murmur heard.    No gallop.  Pulmonary:     Effort: Pulmonary effort is normal. No accessory muscle usage or respiratory distress.     Breath sounds: Normal breath sounds. No decreased breath sounds, wheezing or rales.  Abdominal:     General: Bowel sounds are normal. There is no distension.     Palpations: Abdomen is soft.     Tenderness: There is no abdominal tenderness.  Musculoskeletal:     Cervical back: Normal range of motion and neck supple.     Right lower leg: No edema.     Left lower leg: No edema.  Lymphadenopathy:     Cervical: No cervical adenopathy.  Skin:    General: Skin is warm and dry.  Neurological:     Mental Status: She is alert and oriented to person, place, and time.     Motor: Tremor (both hands with rest) present.     Deep Tendon Reflexes: Reflexes are normal and symmetric.     Reflex Scores:      Brachioradialis reflexes are 2+ on the right side and 2+ on the left side.      Patellar reflexes are 2+ on the right  side and 2+ on the left side.  Comments: No cogwheel or flat affect. Normal gait.  Psychiatric:        Attention and Perception: Attention normal.        Mood and Affect: Mood normal.        Speech: Speech normal.        Behavior: Behavior normal. Behavior is cooperative.        Thought Content: Thought content normal.    Results for orders placed or performed in visit on 12/06/24  Potassium   Collection Time: 12/06/24  8:26 AM  Result Value Ref Range   Potassium 3.6 3.5 - 5.2 mmol/L  HgB A1c   Collection Time: 12/06/24  8:26 AM  Result Value Ref Range   Hgb A1c MFr Bld 5.4 4.8 - 5.6 %   Est. average glucose Bld gHb Est-mCnc 108 mg/dL      Assessment & Plan:   Problem List Items Addressed This Visit       Cardiovascular and Mediastinum   Hypertension   Chronic, ongoing.  BP at goal on recheck, she does have an essential tremor and manual BP shows improved reading over the machine settings. Will continue Losartan -HCTZ 100-25 MG daily (she will restart this) and Amlodipine  10 MG daily for now. Reiterated importance of taking these daily to prevent BP elevations which could lead to acute stroke.  Recommend she continue to monitor BP daily at home and notify provider if consistent above 130/80. Discussed with her not to check BP more than daily due to her anxiety. Labs: BMP. Recommend focus on DASH diet and modest weight loss.  Continue collaboration with cardiology, appreciate their input.  Urine ALB 28 February 2024.  Could consider changing to Valsartan or Telmisartan in future if elevations, may allow longer acting effect.       Relevant Medications   losartan -hydrochlorothiazide  (HYZAAR) 100-25 MG tablet   Other Relevant Orders   Basic metabolic panel with GFR   Atrial fibrillation (HCC) - Primary   New onset 11/23/24. Have educated her at length on a-fib. Continue to collaborate with cardiology, recent note reviewed, and continue current medication regimen. She has been on Paxil   for years and on review all SSRI and SNRI have slight chance for increasing the effect of Eliquis, will monitor this closely as with her anxiety taking her off mood medication would worsen anxiety and quality of life. Discussed this with her today.       Relevant Medications   losartan -hydrochlorothiazide  (HYZAAR) 100-25 MG tablet     Nervous and Auditory   Essential tremor   Reports this has been present for years, does not recall family members with similar. Discussed with her seeing neurology for further assessment, but she prefers to hold off on this. Consider in future.        Other   Hyperlipidemia   Chronic, ongoing.  Recommend she continue Crestor  10 MG Monday/Wednesday/Friday schedule for prevention and to take Zetia  along with this. Could consider Repatha or Praluent in future if does not tolerate oral medications or they do not get her to goal.      Relevant Medications   losartan -hydrochlorothiazide  (HYZAAR) 100-25 MG tablet   Depression with anxiety   Chronic, ongoing for years.  Denies SI/HI.  Continue current medication regimen and adjust as needed.  Due to age would benefit from change to Zoloft or alternate SSRI in future and discontinuation Paxil , will consider next visit. She has been on Paxil  for years and on review all SSRI and SNRI have slight  chance for increasing the effect of Eliquis, will monitor this closely as with her anxiety taking her off mood medication would worsen anxiety and quality of life. Could consider adding on Buspar as well to assist with anxiety.         Follow up plan: Return in about 3 months (around 04/11/2025) for HTN/HLD, A-FIB, TREMORS.      "

## 2025-01-11 NOTE — Assessment & Plan Note (Signed)
 Chronic, ongoing.  Recommend she continue Crestor  10 MG Monday/Wednesday/Friday schedule for prevention and to take Zetia  along with this. Could consider Repatha or Praluent in future if does not tolerate oral medications or they do not get her to goal.

## 2025-01-11 NOTE — Assessment & Plan Note (Signed)
 Chronic, ongoing.  BP at goal on recheck, she does have an essential tremor and manual BP shows improved reading over the machine settings. Will continue Losartan -HCTZ 100-25 MG daily (she will restart this) and Amlodipine  10 MG daily for now. Reiterated importance of taking these daily to prevent BP elevations which could lead to acute stroke.  Recommend she continue to monitor BP daily at home and notify provider if consistent above 130/80. Discussed with her not to check BP more than daily due to her anxiety. Labs: BMP. Recommend focus on DASH diet and modest weight loss.  Continue collaboration with cardiology, appreciate their input.  Urine ALB 28 February 2024.  Could consider changing to Valsartan or Telmisartan in future if elevations, may allow longer acting effect.

## 2025-01-11 NOTE — Progress Notes (Signed)
 Contacted via MyChart   Normal mammogram, may repeat in one year:)

## 2025-01-11 NOTE — Assessment & Plan Note (Signed)
 Reports this has been present for years, does not recall family members with similar. Discussed with her seeing neurology for further assessment, but she prefers to hold off on this. Consider in future.

## 2025-01-12 ENCOUNTER — Ambulatory Visit: Payer: Self-pay | Admitting: Nurse Practitioner

## 2025-01-12 DIAGNOSIS — E876 Hypokalemia: Secondary | ICD-10-CM

## 2025-01-12 LAB — BASIC METABOLIC PANEL WITH GFR
BUN/Creatinine Ratio: 10 — ABNORMAL LOW (ref 12–28)
BUN: 10 mg/dL (ref 8–27)
CO2: 25 mmol/L (ref 20–29)
Calcium: 9.8 mg/dL (ref 8.7–10.3)
Chloride: 101 mmol/L (ref 96–106)
Creatinine, Ser: 1.05 mg/dL — ABNORMAL HIGH (ref 0.57–1.00)
Glucose: 112 mg/dL — ABNORMAL HIGH (ref 70–99)
Potassium: 3.2 mmol/L — ABNORMAL LOW (ref 3.5–5.2)
Sodium: 143 mmol/L (ref 134–144)
eGFR: 56 mL/min/1.73 — ABNORMAL LOW

## 2025-01-12 MED ORDER — POTASSIUM CHLORIDE CRYS ER 20 MEQ PO TBCR: 20.0000 meq | EXTENDED_RELEASE_TABLET | Freq: Every day | ORAL | 0 refills | Status: AC

## 2025-01-12 NOTE — Progress Notes (Signed)
 Lab appt scheduled.

## 2025-01-12 NOTE — Progress Notes (Signed)
 Contacted via MyChart -- lab only visit in one week please  Good morning Merelin, your labs have returned. Kidney function shows mild Stage 3a kidney disease which it has in past as well and we can continue to monitor. Potassium level is a little low. I am sending in some potassium to replace this and would like to recheck in one week. If continues to run low we may need to stop hydrochlorothiazide  in the future and try a different medication. Any questions? Keep being stellar!!  Thank you for allowing me to participate in your care.  I appreciate you. Kindest regards, Lyman Balingit

## 2025-01-22 ENCOUNTER — Other Ambulatory Visit

## 2025-01-26 ENCOUNTER — Other Ambulatory Visit

## 2025-01-26 DIAGNOSIS — E876 Hypokalemia: Secondary | ICD-10-CM

## 2025-04-16 ENCOUNTER — Ambulatory Visit: Admitting: Nurse Practitioner

## 2025-08-02 ENCOUNTER — Ambulatory Visit
